# Patient Record
Sex: Male | Born: 1960 | Hispanic: Yes | Marital: Single | Smoking: Never smoker
Health system: Southern US, Community
[De-identification: ages and names within clinical notes are randomized; demographics above are authoritative.]

## PROBLEM LIST (undated history)

## (undated) DIAGNOSIS — I10 Essential (primary) hypertension: Secondary | ICD-10-CM

## (undated) DIAGNOSIS — I509 Heart failure, unspecified: Secondary | ICD-10-CM

## (undated) DIAGNOSIS — R011 Cardiac murmur, unspecified: Secondary | ICD-10-CM

## (undated) DIAGNOSIS — M791 Myalgia, unspecified site: Secondary | ICD-10-CM

## (undated) DIAGNOSIS — R51 Headache: Secondary | ICD-10-CM

## (undated) DIAGNOSIS — I358 Other nonrheumatic aortic valve disorders: Secondary | ICD-10-CM

## (undated) DIAGNOSIS — M549 Dorsalgia, unspecified: Secondary | ICD-10-CM

## (undated) DIAGNOSIS — R519 Headache, unspecified: Secondary | ICD-10-CM

## (undated) HISTORY — DX: Other nonrheumatic aortic valve disorders: I35.8

## (undated) HISTORY — DX: Headache, unspecified: R51.9

## (undated) HISTORY — DX: Cardiac murmur, unspecified: R01.1

## (undated) HISTORY — DX: Headache: R51

## (undated) HISTORY — DX: Essential (primary) hypertension: I10

## (undated) HISTORY — DX: Myalgia, unspecified site: M79.10

## (undated) HISTORY — DX: Dorsalgia, unspecified: M54.9

## (undated) HISTORY — PX: OTHER SURGICAL HISTORY: SHX169

---

## 2014-10-25 ENCOUNTER — Encounter (HOSPITAL_COMMUNITY): Payer: Self-pay | Admitting: Family Medicine

## 2014-10-25 ENCOUNTER — Emergency Department (HOSPITAL_COMMUNITY)
Admission: EM | Admit: 2014-10-25 | Discharge: 2014-10-25 | Disposition: A | Discharge status: Home / Self Care | Payer: Self-pay | Attending: Emergency Medicine | Admitting: Emergency Medicine

## 2014-10-25 DIAGNOSIS — R1013 Epigastric pain: Secondary | ICD-10-CM | POA: Insufficient documentation

## 2014-10-25 LAB — CBC WITH DIFFERENTIAL/PLATELET
BASOS PCT: 0 % (ref 0–1)
Basophils Absolute: 0 10*3/uL (ref 0.0–0.1)
Eosinophils Absolute: 0 10*3/uL (ref 0.0–0.7)
Eosinophils Relative: 0 % (ref 0–5)
HEMATOCRIT: 46.1 % (ref 39.0–52.0)
Hemoglobin: 15.8 g/dL (ref 13.0–17.0)
Lymphocytes Relative: 12 % (ref 12–46)
Lymphs Abs: 1.2 10*3/uL (ref 0.7–4.0)
MCH: 31.3 pg (ref 26.0–34.0)
MCHC: 34.3 g/dL (ref 30.0–36.0)
MCV: 91.5 fL (ref 78.0–100.0)
MONO ABS: 0.3 10*3/uL (ref 0.1–1.0)
MONOS PCT: 3 % (ref 3–12)
Neutro Abs: 8.6 10*3/uL — ABNORMAL HIGH (ref 1.7–7.7)
Neutrophils Relative %: 85 % — ABNORMAL HIGH (ref 43–77)
Platelets: 272 10*3/uL (ref 150–400)
RBC: 5.04 MIL/uL (ref 4.22–5.81)
RDW: 12.4 % (ref 11.5–15.5)
WBC: 10 10*3/uL (ref 4.0–10.5)

## 2014-10-25 LAB — COMPREHENSIVE METABOLIC PANEL
ALBUMIN: 4.3 g/dL (ref 3.5–5.2)
ALT: 31 U/L (ref 0–53)
AST: 30 U/L (ref 0–37)
Alkaline Phosphatase: 92 U/L (ref 39–117)
Anion gap: 16 — ABNORMAL HIGH (ref 5–15)
BILIRUBIN TOTAL: 0.4 mg/dL (ref 0.3–1.2)
BUN: 18 mg/dL (ref 6–23)
CO2: 21 meq/L (ref 19–32)
CREATININE: 0.7 mg/dL (ref 0.50–1.35)
Calcium: 9.3 mg/dL (ref 8.4–10.5)
Chloride: 104 mEq/L (ref 96–112)
Glucose, Bld: 150 mg/dL — ABNORMAL HIGH (ref 70–99)
Potassium: 3.7 mEq/L (ref 3.7–5.3)
Sodium: 141 mEq/L (ref 137–147)
Total Protein: 8.3 g/dL (ref 6.0–8.3)

## 2014-10-25 LAB — LIPASE, BLOOD: LIPASE: 31 U/L (ref 11–59)

## 2014-10-25 MED ORDER — FAMOTIDINE 20 MG PO TABS: 20.0000 mg | ORAL_TABLET | Freq: Two times a day (BID) | ORAL | Status: DC

## 2014-10-25 MED ORDER — SODIUM CHLORIDE 0.9 % IV BOLUS (SEPSIS): 1000.0000 mL | Freq: Once | INTRAVENOUS | Status: DC

## 2014-10-25 MED ORDER — DICYCLOMINE HCL 20 MG PO TABS: 20.0000 mg | ORAL_TABLET | Freq: Two times a day (BID) | ORAL | Status: DC

## 2014-10-25 MED ORDER — GI COCKTAIL ~~LOC~~
30.0000 mL | Freq: Once | ORAL | Status: AC
  Administered 2014-10-25: 30 mL via ORAL
  Filled 2014-10-25: qty 30

## 2014-10-25 MED ORDER — MORPHINE SULFATE 4 MG/ML IJ SOLN: 4.0000 mg | Freq: Once | INTRAMUSCULAR | Status: DC

## 2014-10-25 MED ORDER — ONDANSETRON HCL 4 MG/2ML IJ SOLN: 4.0000 mg | Freq: Once | INTRAMUSCULAR | Status: DC

## 2014-10-25 NOTE — ED Notes (Signed)
Per Arthor CaptainAbigail Harris, PA-- Hold IV and IV meds at present

## 2014-10-25 NOTE — ED Notes (Signed)
Per pt sts abdominal pain and vomiting since last night. sts he drank a few beers last night.

## 2014-10-25 NOTE — Discharge Instructions (Signed)
Gastritis - Adultos  °(Gastritis, Adult) ° La gastrittis es la irritación (inflamación) de la membrana interna del estómago. Puede ser una enfermedad de inicio súbito (aguda) o de largo plazo (crónica). Si la gastritis no se trata, puede causar sangrado y úlceras. °CAUSAS  °La gastritis se produce cuando la membrana que tapiza interiormente al estómago se debilita o se daña. Los jugos digestivos del estómago inflaman el revestimiento del estómago debilitado. El revestimiento del estómago puede debilitarse o dañarse por una infección viral o bacteriana. La infección bacteriana más común es la infección por Helicobacter pylori. También puede ser el resultado del consumo excesivo de alcohol, por el uso de ciertos medicamentos o porque hay demasiado ácido en el estómago.  °SÍNTOMAS  °En algunos casos no hay síntomas. Si se presentan síntomas, éstos pueden ser:  °· Dolor o sensación de ardor en la parte superior del abdomen. °· Náuseas. °· Vómitos. °· Sensación molesta de distensión después de comer. °DIAGNÓSTICO  °El médico puede diagnosticar gastritis según los síntomas y el examen físico. Para determinar la causa de la gastritis, el médico podrá:  °· Pedir análisis de sangre o de materia fecal para diagnosticar la presencia de la bacteria H pylori. °· Gastroscopía. Un tubo delgado y flexible (endoscopio) se pasa por el esófago hasta llegar al estómago. El endoscopio tiene una luz y una cámara en el extremo. El médico utilizará el endoscopio para observar el interior del estómago. °· Tomará una muestra de tejido (biopsia) del estómago para examinarlo en el microscopio. °TRATAMIENTO  °Según la causa de la gastritis podrán recetarle: Antibióticos, si la causa es una infección bacteriana, como una infección por H. pylori. Antiácidos o bloqueadores H2, si hay demasiado ácido en el estómago. El médico le aconsejará que deje de tomar aspirina, ibuprofeno u otros antiinflamatorios no esteroides (AINE).  °INSTRUCCIONES PARA EL  CUIDADO EN EL HOGAR  °· Tome sólo medicamentos de venta libre o recetados, según las indicaciones del médico. °· Si le han recetado antibióticos, tómelos según las indicaciones. Tómelos todos, aunque se sienta mejor. °· Debe ingerir gran cantidad de líquido para mantener la orina de tono claro o color amarillo pálido. °· Evite las comidas y bebidas que empeoran los problemas, como: °¨ Bebidas con cafeína o alcohólicas. °¨ Chocolate. °¨ Sabores a menta. °¨ Ajo y cebolla. °¨ Comidas muy condimentadas. °¨ Cítricos como naranjas, limones o limas. °¨ Alimentos que contengan tomate, como salsas, chile y pizza. °¨ Alimentos fritos y grasos. °· Haga comidas pequeñas durante el día en lugar de 3 comidas abundantes. °SOLICITE ATENCIÓN MÉDICA DE INMEDIATO SI:  °· La materia fecal es negra o de color rojo oscuro. °· Vomita sangre de color rojo brillante o material similar a granos de café. °· No puede retener los líquidos. °· El dolor abdominal empeora. °· Tiene fiebre. °· No mejora luego de 1 semana. °· Tiene preguntas o preocupaciones. °ASEGÚRESE DE QUE:  °· Comprende estas instrucciones. °· Controlará su enfermedad. °· Solicitará ayuda de inmediato si no mejora o si empeora. °Document Released: 08/30/2005 Document Revised: 08/14/2012 °ExitCare® Patient Information ©2015 [employer]. This information is not intended to replace advice given to you by your health care provider. Make sure you discuss any questions you have with your health care provider. ° °

## 2014-10-25 NOTE — ED Provider Notes (Signed)
CSN: [id number]     Arrival date & time 10/25/14  1159 History   First MD Initiated Contact with Patient 10/25/14 1417     Chief Complaint  Patient presents with  . Abdominal Pain     (Consider location/radiation/quality/duration/timing/severity/associated sxs/prior Treatment) HPI  [name] who presents to the emergency department for chief complaint of abdominal pain. Patient states that he was drinking a lot of beers the night before and when he awoke this morning he had severe abdominal pain in the epigastrium. He had one episode of vomiting, nonbilious, nonbloody vomitus. He states his pain was relieved and he has no more pain currently. He denies melena, hematochezia, diarrhea or constipation. He denies fever, chills or focal abdominal pain.   History reviewed. No pertinent past medical history. History reviewed. No pertinent past surgical history. History reviewed. No pertinent family history. History  Substance Use Topics  . Smoking status: Never Smoker   . Smokeless tobacco: Not on file  . Alcohol Use: Yes    Review of Systems  Ten systems reviewed and are negative for acute change, except as noted in the HPI.    Allergies  Review of patient's allergies indicates no known allergies.  Home Medications   Prior to Admission medications   Not on File   BP 124/83 mmHg  Pulse 66  Temp(Src) 97.5 F (36.4 C) (Oral)  Resp 20  SpO2 97% Physical Exam  Constitutional: He is oriented to person, place, and time. He appears well-developed and well-nourished. No distress.  HENT:  Head: Normocephalic and atraumatic.  Eyes: Conjunctivae are normal. No scleral icterus.  Neck: Normal range of motion. Neck supple.  Cardiovascular: Normal rate, regular rhythm and normal heart sounds.   Pulmonary/Chest: Effort normal and breath sounds normal. No respiratory distress.  Abdominal: Soft. Bowel sounds are normal. He exhibits no distension and no mass. There is no  tenderness. There is no rebound and no guarding.  Musculoskeletal: He exhibits no edema.  Neurological: He is alert and oriented to person, place, and time.  Skin: Skin is warm and dry. He is not diaphoretic.  Psychiatric: His behavior is normal.  Nursing note and vitals reviewed.   ED Course  Procedures (including critical care time) Labs Review Labs Reviewed  CBC WITH DIFFERENTIAL - Abnormal; Notable for the following:    Neutrophils Relative % 85 (*)    Neutro Abs 8.6 (*)    All other components within normal limits  COMPREHENSIVE METABOLIC PANEL - Abnormal; Notable for the following:    Glucose, Bld 150 (*)    Anion gap 16 (*)    All other components within normal limits  LIPASE, BLOOD  URINALYSIS, ROUTINE W REFLEX MICROSCOPIC    Imaging Review No results found.   EKG Interpretation None      MDM   Final diagnoses:  Epigastric pain    Patient with severe abdominal pain after drinking heavily last night. Labs show normal lipase. He has no pain at this time. Examination of the abdomen reveals no tenderness, no guarding, no rebound, no distention. He is no elevation in his lipase. I suspect acute gastritis secondary to alcohol ingestion. Patient states that he feels much better, would like to leave. Discussed report her precautions. He appears safe for discharge at this time. Tolerating by mouth fluids.    John Captainbigail Janos Shampine, PA-C 10/30/14 [phone]  John ChickMartha K Linker, MD 11/02/14 [phone]

## 2018-02-26 ENCOUNTER — Other Ambulatory Visit: Payer: Self-pay | Admitting: Nurse Practitioner

## 2018-02-26 ENCOUNTER — Ambulatory Visit
Admission: RE | Admit: 2018-02-26 | Discharge: 2018-02-26 | Disposition: A | Discharge status: Home / Self Care | Payer: Self-pay | Source: Ambulatory Visit | Attending: Nurse Practitioner | Admitting: Nurse Practitioner

## 2018-02-26 DIAGNOSIS — R0602 Shortness of breath: Secondary | ICD-10-CM

## 2018-02-28 ENCOUNTER — Telehealth: Payer: Self-pay

## 2018-02-28 NOTE — Telephone Encounter (Signed)
Referral sent to scheduling. 

## 2018-03-09 ENCOUNTER — Encounter (HOSPITAL_COMMUNITY): Payer: Self-pay | Admitting: Emergency Medicine

## 2018-03-09 ENCOUNTER — Emergency Department (HOSPITAL_COMMUNITY): Payer: Self-pay

## 2018-03-09 ENCOUNTER — Inpatient Hospital Stay (HOSPITAL_COMMUNITY): Payer: Self-pay

## 2018-03-09 ENCOUNTER — Other Ambulatory Visit: Payer: Self-pay

## 2018-03-09 ENCOUNTER — Inpatient Hospital Stay (HOSPITAL_COMMUNITY)
Admission: EM | Admit: 2018-03-09 | Discharge: 2018-03-19 | DRG: 219 | Disposition: A | Discharge status: Home Health | Payer: Self-pay | Attending: Thoracic Surgery (Cardiothoracic Vascular Surgery) | Admitting: Thoracic Surgery (Cardiothoracic Vascular Surgery)

## 2018-03-09 DIAGNOSIS — I358 Other nonrheumatic aortic valve disorders: Secondary | ICD-10-CM

## 2018-03-09 DIAGNOSIS — I5033 Acute on chronic diastolic (congestive) heart failure: Secondary | ICD-10-CM | POA: Diagnosis present

## 2018-03-09 DIAGNOSIS — Z9289 Personal history of other medical treatment: Secondary | ICD-10-CM

## 2018-03-09 DIAGNOSIS — I351 Nonrheumatic aortic (valve) insufficiency: Secondary | ICD-10-CM | POA: Diagnosis present

## 2018-03-09 DIAGNOSIS — D62 Acute posthemorrhagic anemia: Secondary | ICD-10-CM | POA: Diagnosis not present

## 2018-03-09 DIAGNOSIS — J9690 Respiratory failure, unspecified, unspecified whether with hypoxia or hypercapnia: Secondary | ICD-10-CM

## 2018-03-09 DIAGNOSIS — I339 Acute and subacute endocarditis, unspecified: Secondary | ICD-10-CM

## 2018-03-09 DIAGNOSIS — E876 Hypokalemia: Secondary | ICD-10-CM | POA: Diagnosis present

## 2018-03-09 DIAGNOSIS — J811 Chronic pulmonary edema: Secondary | ICD-10-CM

## 2018-03-09 DIAGNOSIS — J9601 Acute respiratory failure with hypoxia: Secondary | ICD-10-CM | POA: Diagnosis present

## 2018-03-09 DIAGNOSIS — I509 Heart failure, unspecified: Secondary | ICD-10-CM

## 2018-03-09 DIAGNOSIS — Z4659 Encounter for fitting and adjustment of other gastrointestinal appliance and device: Secondary | ICD-10-CM

## 2018-03-09 DIAGNOSIS — J810 Acute pulmonary edema: Secondary | ICD-10-CM

## 2018-03-09 DIAGNOSIS — R570 Cardiogenic shock: Secondary | ICD-10-CM | POA: Diagnosis present

## 2018-03-09 DIAGNOSIS — I7410 Embolism and thrombosis of unspecified parts of aorta: Secondary | ICD-10-CM | POA: Diagnosis present

## 2018-03-09 DIAGNOSIS — Z978 Presence of other specified devices: Secondary | ICD-10-CM

## 2018-03-09 DIAGNOSIS — Z952 Presence of prosthetic heart valve: Secondary | ICD-10-CM

## 2018-03-09 DIAGNOSIS — I110 Hypertensive heart disease with heart failure: Secondary | ICD-10-CM | POA: Diagnosis present

## 2018-03-09 DIAGNOSIS — Z79899 Other long term (current) drug therapy: Secondary | ICD-10-CM

## 2018-03-09 DIAGNOSIS — E871 Hypo-osmolality and hyponatremia: Secondary | ICD-10-CM | POA: Diagnosis present

## 2018-03-09 HISTORY — DX: Heart failure, unspecified: I50.9

## 2018-03-09 LAB — POCT I-STAT 3, ART BLOOD GAS (G3+)
Bicarbonate: 24 mmol/L (ref 20.0–28.0)
O2 Saturation: 97 %
PH ART: 7.411 (ref 7.350–7.450)
PO2 ART: 91 mmHg (ref 83.0–108.0)
TCO2: 25 mmol/L (ref 22–32)
pCO2 arterial: 37.7 mmHg (ref 32.0–48.0)

## 2018-03-09 LAB — PHOSPHORUS: PHOSPHORUS: 3.9 mg/dL (ref 2.5–4.6)

## 2018-03-09 LAB — ECHOCARDIOGRAM COMPLETE: Weight: 2640 oz

## 2018-03-09 LAB — LACTIC ACID, PLASMA: Lactic Acid, Venous: 1.1 mmol/L (ref 0.5–1.9)

## 2018-03-09 LAB — CBC
HCT: 32.1 % — ABNORMAL LOW (ref 39.0–52.0)
HEMATOCRIT: 32.5 % — AB (ref 39.0–52.0)
HEMOGLOBIN: 10.5 g/dL — AB (ref 13.0–17.0)
Hemoglobin: 10.9 g/dL — ABNORMAL LOW (ref 13.0–17.0)
MCH: 30.4 pg (ref 26.0–34.0)
MCH: 29.2 pg (ref 26.0–34.0)
MCHC: 34 g/dL (ref 30.0–36.0)
MCHC: 32.3 g/dL (ref 30.0–36.0)
MCV: 89.7 fL (ref 78.0–100.0)
MCV: 90.5 fL (ref 78.0–100.0)
Platelets: 359 10*3/uL (ref 150–400)
Platelets: 334 10*3/uL (ref 150–400)
RBC: 3.58 MIL/uL — ABNORMAL LOW (ref 4.22–5.81)
RBC: 3.59 MIL/uL — ABNORMAL LOW (ref 4.22–5.81)
RDW: 15.3 % (ref 11.5–15.5)
RDW: 15.5 % (ref 11.5–15.5)
WBC: 15.9 10*3/uL — ABNORMAL HIGH (ref 4.0–10.5)
WBC: 16.7 10*3/uL — AB (ref 4.0–10.5)

## 2018-03-09 LAB — HEPATIC FUNCTION PANEL
ALT: 95 U/L — ABNORMAL HIGH (ref 17–63)
ALT: 94 U/L — ABNORMAL HIGH (ref 17–63)
AST: 74 U/L — ABNORMAL HIGH (ref 15–41)
AST: 84 U/L — AB (ref 15–41)
Albumin: 1.9 g/dL — ABNORMAL LOW (ref 3.5–5.0)
Albumin: 1.8 g/dL — ABNORMAL LOW (ref 3.5–5.0)
Alkaline Phosphatase: 151 U/L — ABNORMAL HIGH (ref 38–126)
Alkaline Phosphatase: 147 U/L — ABNORMAL HIGH (ref 38–126)
BILIRUBIN DIRECT: 0.8 mg/dL — AB (ref 0.1–0.5)
BILIRUBIN INDIRECT: 0.9 mg/dL (ref 0.3–0.9)
Bilirubin, Direct: 0.6 mg/dL — ABNORMAL HIGH (ref 0.1–0.5)
Indirect Bilirubin: 1.2 mg/dL — ABNORMAL HIGH (ref 0.3–0.9)
TOTAL PROTEIN: 7.2 g/dL (ref 6.5–8.1)
Total Bilirubin: 1.8 mg/dL — ABNORMAL HIGH (ref 0.3–1.2)
Total Bilirubin: 1.7 mg/dL — ABNORMAL HIGH (ref 0.3–1.2)
Total Protein: 7.8 g/dL (ref 6.5–8.1)

## 2018-03-09 LAB — BASIC METABOLIC PANEL
ANION GAP: 12 (ref 5–15)
Anion gap: 13 (ref 5–15)
BUN: 12 mg/dL (ref 6–20)
BUN: 11 mg/dL (ref 6–20)
CALCIUM: 7.2 mg/dL — AB (ref 8.9–10.3)
CHLORIDE: 96 mmol/L — AB (ref 101–111)
CO2: 20 mmol/L — ABNORMAL LOW (ref 22–32)
CO2: 22 mmol/L (ref 22–32)
Calcium: 7.7 mg/dL — ABNORMAL LOW (ref 8.9–10.3)
Chloride: 95 mmol/L — ABNORMAL LOW (ref 101–111)
Creatinine, Ser: 0.81 mg/dL (ref 0.61–1.24)
Creatinine, Ser: 0.71 mg/dL (ref 0.61–1.24)
GFR calc Af Amer: >60 mL/min (ref >60)
GFR calc non Af Amer: >60 mL/min (ref >60)
GFR calc non Af Amer: >60 mL/min (ref >60)
Glucose, Bld: 125 mg/dL — ABNORMAL HIGH (ref 65–99)
Glucose, Bld: 107 mg/dL — ABNORMAL HIGH (ref 65–99)
Potassium: 3.3 mmol/L — ABNORMAL LOW (ref 3.5–5.1)
Potassium: 3.2 mmol/L — ABNORMAL LOW (ref 3.5–5.1)
Sodium: 128 mmol/L — ABNORMAL LOW (ref 135–145)
Sodium: 130 mmol/L — ABNORMAL LOW (ref 135–145)

## 2018-03-09 LAB — URINALYSIS, ROUTINE W REFLEX MICROSCOPIC
Bacteria, UA: NONE SEEN
Bilirubin Urine: NEGATIVE
GLUCOSE, UA: NEGATIVE mg/dL
Ketones, ur: 5 mg/dL — AB
LEUKOCYTES UA: NEGATIVE
Nitrite: NEGATIVE
Protein, ur: NEGATIVE mg/dL
SPECIFIC GRAVITY, URINE: 1.008 (ref 1.005–1.030)
pH: 7 (ref 5.0–8.0)

## 2018-03-09 LAB — RAPID URINE DRUG SCREEN, HOSP PERFORMED
AMPHETAMINES: NOT DETECTED
BARBITURATES: NOT DETECTED
Benzodiazepines: NOT DETECTED
Cocaine: NOT DETECTED
Opiates: NOT DETECTED
TETRAHYDROCANNABINOL: NOT DETECTED

## 2018-03-09 LAB — RESPIRATORY PANEL BY PCR
Adenovirus: NOT DETECTED
BORDETELLA PERTUSSIS-RVPCR: NOT DETECTED
CORONAVIRUS NL63-RVPPCR: NOT DETECTED
Chlamydophila pneumoniae: NOT DETECTED
Coronavirus 229E: NOT DETECTED
Coronavirus HKU1: NOT DETECTED
Coronavirus OC43: NOT DETECTED
Influenza A: NOT DETECTED
Influenza B: NOT DETECTED
METAPNEUMOVIRUS-RVPPCR: NOT DETECTED
Mycoplasma pneumoniae: NOT DETECTED
PARAINFLUENZA VIRUS 1-RVPPCR: NOT DETECTED
PARAINFLUENZA VIRUS 4-RVPPCR: NOT DETECTED
Parainfluenza Virus 2: NOT DETECTED
Parainfluenza Virus 3: NOT DETECTED
RESPIRATORY SYNCYTIAL VIRUS-RVPPCR: NOT DETECTED
Rhinovirus / Enterovirus: NOT DETECTED

## 2018-03-09 LAB — MRSA PCR SCREENING: MRSA by PCR: NEGATIVE

## 2018-03-09 LAB — GLUCOSE, CAPILLARY: GLUCOSE-CAPILLARY: 114 mg/dL — AB (ref 65–99)

## 2018-03-09 LAB — ABO/RH: ABO/RH(D): B POS

## 2018-03-09 LAB — BRAIN NATRIURETIC PEPTIDE: B Natriuretic Peptide: 721.6 pg/mL — ABNORMAL HIGH (ref 0.0–100.0)

## 2018-03-09 LAB — I-STAT TROPONIN, ED: Troponin i, poc: 0.02 ng/mL (ref 0.00–0.08)

## 2018-03-09 LAB — PROCALCITONIN: PROCALCITONIN: 0.29 ng/mL

## 2018-03-09 LAB — MAGNESIUM: Magnesium: 2.1 mg/dL (ref 1.7–2.4)

## 2018-03-09 LAB — HEMOGLOBIN A1C
HEMOGLOBIN A1C: 5.8 % — AB (ref 4.8–5.6)
Mean Plasma Glucose: 119.76 mg/dL

## 2018-03-09 MED ORDER — POTASSIUM CHLORIDE 10 MEQ/100ML IV SOLN
10.0000 meq | INTRAVENOUS | Status: DC
  Administered 2018-03-09: 10 meq via INTRAVENOUS
  Filled 2018-03-09: qty 100

## 2018-03-09 MED ORDER — SODIUM CHLORIDE 0.9 % IV SOLN
1.0000 g | Freq: Once | INTRAVENOUS | Status: AC
  Administered 2018-03-09: 1 g via INTRAVENOUS
  Filled 2018-03-09: qty 10

## 2018-03-09 MED ORDER — POTASSIUM CHLORIDE 10 MEQ/100ML IV SOLN
10.0000 meq | INTRAVENOUS | Status: AC
  Administered 2018-03-09: 10 meq via INTRAVENOUS
  Filled 2018-03-09: qty 100

## 2018-03-09 MED ORDER — FUROSEMIDE 10 MG/ML IJ SOLN
40.0000 mg | Freq: Once | INTRAMUSCULAR | Status: AC
  Administered 2018-03-09: 40 mg via INTRAVENOUS

## 2018-03-09 MED ORDER — SODIUM CHLORIDE 0.9 % IV SOLN
500.0000 mg | Freq: Once | INTRAVENOUS | Status: AC
  Administered 2018-03-09: 500 mg via INTRAVENOUS
  Filled 2018-03-09: qty 500

## 2018-03-09 MED ORDER — SODIUM CHLORIDE 0.9 % IV SOLN
250.0000 mL | INTRAVENOUS | Status: DC | PRN
  Administered 2018-03-09: 21:00:00 via INTRAVENOUS
  Administered 2018-03-10: 250 mL via INTRAVENOUS

## 2018-03-09 MED ORDER — POTASSIUM CHLORIDE CRYS ER 20 MEQ PO TBCR: 40.0000 meq | EXTENDED_RELEASE_TABLET | ORAL | Status: DC

## 2018-03-09 MED ORDER — CEFTRIAXONE SODIUM 1 G IJ SOLR
1.0000 g | Freq: Once | INTRAMUSCULAR | Status: AC
  Administered 2018-03-09: 1 g via INTRAVENOUS
  Filled 2018-03-09: qty 10

## 2018-03-09 MED ORDER — FENTANYL CITRATE (PF) 100 MCG/2ML IJ SOLN
50.0000 ug | Freq: Once | INTRAMUSCULAR | Status: AC
  Administered 2018-03-09: 50 ug via INTRAVENOUS

## 2018-03-09 MED ORDER — NITROGLYCERIN IN D5W 200-5 MCG/ML-% IV SOLN: 0.0000 ug/min | INTRAVENOUS | Status: DC

## 2018-03-09 MED ORDER — INSULIN ASPART 100 UNIT/ML ~~LOC~~ SOLN
0.0000 [IU] | SUBCUTANEOUS | Status: DC
  Administered 2018-03-10: 2 [IU] via SUBCUTANEOUS
  Administered 2018-03-10: 3 [IU] via SUBCUTANEOUS

## 2018-03-09 MED ORDER — PROPOFOL 1000 MG/100ML IV EMUL
INTRAVENOUS | Status: AC
  Filled 2018-03-09: qty 100

## 2018-03-09 MED ORDER — SODIUM CHLORIDE 0.9 % IV SOLN
2.0000 g | INTRAVENOUS | Status: DC
  Administered 2018-03-10 – 2018-03-18 (×8): 2 g via INTRAVENOUS
  Filled 2018-03-09 (×11): qty 20

## 2018-03-09 MED ORDER — MAGNESIUM SULFATE 2 GM/50ML IV SOLN: 2.0000 g | Freq: Once | INTRAVENOUS | Status: DC

## 2018-03-09 MED ORDER — POTASSIUM & SODIUM PHOSPHATES 280-160-250 MG PO PACK: 1.0000 | PACK | Freq: Three times a day (TID) | ORAL | Status: DC

## 2018-03-09 MED ORDER — MIDAZOLAM HCL 2 MG/2ML IJ SOLN: 2.0000 mg | INTRAMUSCULAR | Status: DC | PRN

## 2018-03-09 MED ORDER — POTASSIUM CHLORIDE CRYS ER 20 MEQ PO TBCR: 20.0000 meq | EXTENDED_RELEASE_TABLET | ORAL | Status: DC

## 2018-03-09 MED ORDER — FENTANYL BOLUS VIA INFUSION
50.0000 ug | INTRAVENOUS | Status: DC | PRN
  Administered 2018-03-09 – 2018-03-10 (×2): 50 ug via INTRAVENOUS
  Filled 2018-03-09: qty 50

## 2018-03-09 MED ORDER — FUROSEMIDE 10 MG/ML IJ SOLN: 40.0000 mg | Freq: Three times a day (TID) | INTRAMUSCULAR | Status: DC

## 2018-03-09 MED ORDER — NOREPINEPHRINE BITARTRATE 1 MG/ML IV SOLN
0.0000 ug/min | INTRAVENOUS | Status: AC
  Administered 2018-03-09: 0 ug/min via INTRAVENOUS
  Filled 2018-03-09: qty 4

## 2018-03-09 MED ORDER — MIDAZOLAM HCL 2 MG/2ML IJ SOLN
2.0000 mg | INTRAMUSCULAR | Status: DC | PRN
  Administered 2018-03-09 (×2): 2 mg via INTRAVENOUS
  Filled 2018-03-09 (×2): qty 2

## 2018-03-09 MED ORDER — FENTANYL 2500MCG IN NS 250ML (10MCG/ML) PREMIX INFUSION
25.0000 ug/h | INTRAVENOUS | Status: DC
  Administered 2018-03-09: 50 ug/h via INTRAVENOUS
  Administered 2018-03-10: 175 ug/h via INTRAVENOUS
  Filled 2018-03-09 (×3): qty 250

## 2018-03-09 MED ORDER — VANCOMYCIN HCL 10 G IV SOLR
1500.0000 mg | Freq: Once | INTRAVENOUS | Status: AC
  Administered 2018-03-09: 1500 mg via INTRAVENOUS
  Filled 2018-03-09: qty 1500

## 2018-03-09 MED ORDER — FAMOTIDINE 40 MG/5ML PO SUSR
20.0000 mg | Freq: Two times a day (BID) | ORAL | Status: DC
  Administered 2018-03-10 (×3): 20 mg
  Filled 2018-03-09 (×3): qty 2.5

## 2018-03-09 MED ORDER — CHLORHEXIDINE GLUCONATE 0.12 % MT SOLN
15.0000 mL | Freq: Two times a day (BID) | OROMUCOSAL | Status: DC
  Administered 2018-03-10 (×3): 15 mL via OROMUCOSAL

## 2018-03-09 MED ORDER — LIDOCAINE HCL (PF) 1 % IJ SOLN
INTRAMUSCULAR | Status: AC
  Filled 2018-03-09: qty 5

## 2018-03-09 MED ORDER — POTASSIUM CHLORIDE CRYS ER 20 MEQ PO TBCR: 30.0000 meq | EXTENDED_RELEASE_TABLET | ORAL | Status: DC

## 2018-03-09 MED ORDER — FUROSEMIDE 10 MG/ML IJ SOLN
15.0000 mg/h | INTRAVENOUS | Status: DC
  Administered 2018-03-09: 8 mg/h via INTRAVENOUS
  Administered 2018-03-10: 15 mg/h via INTRAVENOUS
  Filled 2018-03-09: qty 25
  Filled 2018-03-09: qty 20
  Filled 2018-03-09: qty 25

## 2018-03-09 MED ORDER — FUROSEMIDE 10 MG/ML IJ SOLN
40.0000 mg | Freq: Once | INTRAMUSCULAR | Status: AC
  Administered 2018-03-09: 40 mg via INTRAVENOUS
  Filled 2018-03-09: qty 4

## 2018-03-09 MED ORDER — NITROPRUSSIDE SODIUM 25 MG/ML IV SOLN
0.0000 ug/kg/min | INTRAVENOUS | Status: DC
  Administered 2018-03-09: 0.2 ug/kg/min via INTRAVENOUS
  Filled 2018-03-09: qty 2

## 2018-03-09 MED ORDER — INSULIN ASPART 100 UNIT/ML ~~LOC~~ SOLN: 0.0000 [IU] | Freq: Three times a day (TID) | SUBCUTANEOUS | Status: DC

## 2018-03-09 MED ORDER — ENOXAPARIN SODIUM 40 MG/0.4ML ~~LOC~~ SOLN
40.0000 mg | SUBCUTANEOUS | Status: DC
  Administered 2018-03-10: 40 mg via SUBCUTANEOUS
  Filled 2018-03-09: qty 0.4

## 2018-03-09 MED ORDER — SODIUM CHLORIDE 0.9 % IV SOLN: 6.0000 g | Freq: Once | INTRAVENOUS | Status: DC

## 2018-03-09 MED ORDER — VANCOMYCIN HCL IN DEXTROSE 750-5 MG/150ML-% IV SOLN
750.0000 mg | Freq: Three times a day (TID) | INTRAVENOUS | Status: DC
Start: 2018-03-10 — End: 2018-03-15
  Administered 2018-03-10 – 2018-03-15 (×16): 750 mg via INTRAVENOUS
  Filled 2018-03-09 (×17): qty 150

## 2018-03-09 MED ORDER — ORAL CARE MOUTH RINSE: 15.0000 mL | Freq: Two times a day (BID) | OROMUCOSAL | Status: DC

## 2018-03-09 MED ORDER — DOCUSATE SODIUM 50 MG/5ML PO LIQD
100.0000 mg | Freq: Two times a day (BID) | ORAL | Status: DC
  Administered 2018-03-10 (×3): 100 mg
  Filled 2018-03-09 (×3): qty 10

## 2018-03-09 NOTE — Progress Notes (Signed)
  Echocardiogram 2D Echocardiogram has been performed.  Aydn Ferrara T Daisha Filosa 03/09/2018, 4:57 PM

## 2018-03-09 NOTE — Progress Notes (Signed)
Pt's girlfriend Kenney Housemananya and Alfredo Bachstepson Henry at bedside. NP Leitha BleakKatalina and chaplain notified so HPOA paperwork can get started.

## 2018-03-09 NOTE — ED Notes (Signed)
NRB applied to patient. Pulse ox dropped to 80% on 4 LPM via nasal cannula. Patient using accessory muscles to breath.

## 2018-03-09 NOTE — Progress Notes (Signed)
Pt transported on Bipap from ED to 2H12.

## 2018-03-09 NOTE — Procedures (Signed)
Endotracheal Intubation Procedure Note Indication for endotracheal intubation: impending respiratory failure Sedation: etomidate and midazolam Paralytic: succinylcholine Equipment: Macintosh 3 laryngoscope blade and 7.95mm cuffed endotracheal tube; Secured at 23cm at the lip Cricoid Pressure: no Number of attempts: 1 ETT location confirmed by by auscultation, by CXR and ETCO2 monitor.

## 2018-03-09 NOTE — Consult Note (Signed)
Reason for Consult:aortic valve endocarditis Referring Physician: Dr. Debbe Bales John Byrd is an 57 y.o. male.  HPI: 57 yo man with history of hypertension. No prior cardiac history. He has been feeling poorly for about 3 weeks now. He has had progressive cough, shortness of breath and leg swelling. No recent fever but did have chills about a month ago. He was seen about a week ago and had a CXR but there are no records in our system about the visit.  Today he presented to the ED with worsening shortness of breath and orthopnea. CXR showed severe pulmonary edema. BNP elevated. WBC elevated. Was started on empiric antibiotics. A TTE showed severe AI with a vegetation.  He was given IV lasix and had a good initial response. Currently looks better than he did on arrival per Dr. Dellie Catholic. He is on BIPAP  History and discussion were done through a professional interpreter. Past Medical History:  Diagnosis Date  . CHF (congestive heart failure) ([place])     Past Surgical History:  Procedure Laterality Date  . No prior surgery      History reviewed. No pertinent family history.  Social History:  reports that he has never smoked. He has never used smokeless tobacco. He reports that he drinks alcohol. He reports that he has current or past drug history.  Allergies: No Known Allergies  Medications:  Scheduled: . enoxaparin (LOVENOX) injection  40 mg Subcutaneous Q24H  . furosemide  40 mg Intravenous Q8H  . [START ON 03/10/2018] insulin aspart  0-15 Units Subcutaneous TID WC    Results for orders placed or performed during the hospital encounter of 03/09/18 (from the past 48 hour(s))  Hepatic function panel     Status: Abnormal   Collection Time: 03/09/18 11:08 AM  Result Value Ref Range   Total Protein 7.8 6.5 - 8.1 g/dL   Albumin 1.9 (L) 3.5 - 5.0 g/dL   AST 74 (H) 15 - 41 U/L   ALT 95 (H) 17 - 63 U/L   Alkaline Phosphatase 151 (H) 38 - 126 U/L   Total Bilirubin 1.8 (H)  0.3 - 1.2 mg/dL   Bilirubin, Direct 0.6 (H) 0.1 - 0.5 mg/dL   Indirect Bilirubin 1.2 (H) 0.3 - 0.9 mg/dL    Comment: Performed at [facility], [address], [place], [place] [address]  Basic metabolic panel     Status: Abnormal   Collection Time: 03/09/18 11:20 AM  Result Value Ref Range   Sodium 128 (L) 135 - 145 mmol/L   Potassium 3.3 (L) 3.5 - 5.1 mmol/L   Chloride 95 (L) 101 - 111 mmol/L   CO2 20 (L) 22 - 32 mmol/L   Glucose, Bld 125 (H) 65 - 99 mg/dL   BUN 12 6 - 20 mg/dL   Creatinine, Ser 0.81 0.61 - 1.24 mg/dL   Calcium 7.7 (L) 8.9 - 10.3 mg/dL   GFR calc non Af Amer >60 >60 mL/min   GFR calc Af Amer >60 >60 mL/min    Comment: (NOTE) The eGFR has been calculated using the CKD EPI equation. This calculation has not been validated in all clinical situations. eGFR's persistently <60 mL/min signify possible Chronic Kidney Disease.    Anion gap 13 5 - 15    Comment: Performed at [facility] [address], [place], [place] [address]  CBC     Status: Abnormal   Collection Time: 03/09/18 11:20 AM  Result Value Ref Range  WBC 15.9 (H) 4.0 - 10.5 K/uL   RBC 3.58 (L) 4.22 - 5.81 MIL/uL   Hemoglobin 10.9 (L) 13.0 - 17.0 g/dL   HCT 32.1 (L) 39.0 - 52.0 %   MCV 89.7 78.0 - 100.0 fL   MCH 30.4 26.0 - 34.0 pg   MCHC 34.0 30.0 - 36.0 g/dL   RDW 15.3 11.5 - 15.5 %   Platelets 359 150 - 400 K/uL    Comment: Performed at [facility] [address] [place], [place] [address]  I-stat troponin, ED     Status: None   Collection Time: 03/09/18 11:26 AM  Result Value Ref Range   Troponin i, poc 0.02 0.00 - 0.08 ng/mL   Comment 3            Comment: Due to the release kinetics of cTnI, a negative result within the first hours of the onset of symptoms does not rule out myocardial infarction with certainty. If myocardial infarction is still suspected, repeat the test at appropriate intervals.   Brain natriuretic peptide     Status: Abnormal   Collection Time:  03/09/18 11:58 AM  Result Value Ref Range   B Natriuretic Peptide 721.6 (H) 0.0 - 100.0 pg/mL    Comment: Performed at [facility] [address] [place], [place] [address]  Type and screen [facility]     Status: None   Collection Time: 03/09/18  4:10 PM  Result Value Ref Range   ABO/RH(D) B POS    Antibody Screen NEG    Sample Expiration      03/12/2018 Performed at [facility], [place] [address], [place], [place] [address]   ABO/Rh     Status: None (Preliminary result)   Collection Time: 03/09/18  4:10 PM  Result Value Ref Range   ABO/RH(D)      B POS Performed at [facility] [address] [place], [place] [address]     Dg Chest 2 View  Result Date: 03/09/2018 CLINICAL DATA:  SOB Pt is having SOB and CP x 2 days. Pt was on O2 at time of exam. EXAM: CHEST - 2 VIEW COMPARISON:  02/26/2018 FINDINGS: There are interstitial and airspace opacities throughout the entire right lung and within the left lung centered in the perihilar and lower lung, partly silhouetting the left heart border. The distribution of airspace opacities is less centrally predominant than it was on the prior study. No pleural effusion or pneumothorax. Heart is mildly enlarged. No convincing mediastinal or hilar masses. Hila are partly obscured by the contiguous lung opacities. Skeletal structures are intact. IMPRESSION: 1. Bilateral interstitial and airspace lung opacities, similar to the prior chest radiograph although less centrally predominant. Multifocal pneumonia suspected. Electronically Signed   By: Lajean Manes M.D.   On: 03/09/2018 11:57    Review of Systems  Constitutional: Positive for malaise/fatigue.  Eyes: Negative for blurred vision and double vision.  Respiratory: Positive for shortness of breath.   Cardiovascular: Positive for leg swelling. Negative for chest pain.  Gastrointestinal: Negative for nausea and vomiting.  Musculoskeletal: Negative for joint pain  and myalgias.  Neurological: Negative for loss of consciousness and headaches.   Blood pressure 124/60, pulse (!) 105, temperature 98.4 F (36.9 C), temperature source Oral, resp. rate (!) 49, height 5' 3" (1.6 m), weight 167 lb 15.9 oz (76.2 kg), SpO2 100 %. Physical Exam  Vitals reviewed. Constitutional: He is oriented to person, place, and  time. He appears well-developed.  Increased WOB, using accessory muscles  HENT:  Head: Normocephalic and atraumatic.  Mouth/Throat: No oropharyngeal exudate.  Eyes: Conjunctivae and EOM are normal. No scleral icterus.  Neck: Neck supple. No thyromegaly present.  Cardiovascular: Normal rate, regular rhythm and intact distal pulses.  Murmur (almost continuous along left sternal border) heard. Respiratory: He is in respiratory distress. He has rales (diffuse bilateral).  GI: He exhibits distension (mildly). There is no tenderness.  Musculoskeletal: He exhibits edema (2+).  Lymphadenopathy:    He has no cervical adenopathy.  Neurological: He is alert and oriented to person, place, and time. No cranial nerve deficit. He exhibits normal muscle tone.  Skin: Skin is warm and dry. He is not diaphoretic.   Echocardiogram Study Conclusions  - Left ventricle: The cavity size was mildly dilated. Wall   thickness was normal. Systolic function was normal. The estimated   ejection fraction was in the range of 50% to 55%. Wall motion was   normal; there were no regional wall motion abnormalities. - Aortic valve: There was an apparent, large vegetation. There was   severe regurgitation. - Mitral valve: Calcified annulus. There was mild regurgitation.  Impressions:  - Normal LV systolic function; mild LVE; large vegetation on aortic   valve with severe AI (pressure half time 135; flow reversal in   descending aorta); mild MR and TR. I personally reviewed the echo images along with Dr. Stanford Breed and concur with his findings  Assessment/Plan: Mr.  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 He has been ill for the past several weeks and had a CXR about a week that showed pulmonary edema. He is currently in acute respiratory failure due to pulmonary edema. His CXr shows severe pulmonary edema and he likely will need to be intubated.  He will need AVR. Timing is difficult in his case as we want to avoid undue delay but also want to try to optimize his condition prior to the procedure. Based on his CXR I don't believe he would wean from CPB if we went to OR now. If we can diurese him to some degree prior to surgery it will improve his chances of survival.  I discussed aortic valve replacement with John Byrd via the interpreter. I informed of the general nature of the procedure, the incisions to be used, the need for cardiopulmonary bypass. He understands the high risk nature of the procedure. He understands the indications, risks, benefits and alternatives. He understands the risks include but are not limited to death, MI, stroke, blood clots, infection, bleeding, possible need for transfusion, respiratory or renal failure, as wel as the possibility of other unforeseeable complications.  Also discussed the type of valve- mechanical v tissue. We discussed the advantages/ disadvantages of each. He recently ran out of his medication for BP. I think a tissue valve is the best option for him. He agrees.  Will likely proceed with surgery in next 24-48 hours.  Melrose Nakayama 03/09/2018, 6:00 PM

## 2018-03-09 NOTE — H&P (Signed)
Date: 03/09/2018               Patient Name:  John Byrd MRN: [id number]  DOB: 1961-05-08 Age / Sex: 57 y.o., male   PCP: Patient, No Pcp Per         Medical Service: Internal Medicine Teaching Service         Attending Physician: Dr. Kloefkorn, [place], MD    First Contact: Dr. Alinda Money Pager: [phone]  Second Contact: Dr. Obie Dredge Pager: [phone]       After Hours (After 5p/  First Contact Pager: [phone]  weekends / holidays): Second Contact Pager: [phone]   Chief Complaint: Shortness of breath  History of Present Illness: John Byrd in a 57 yo M without known significant past medical history who present with progressive shortness of breath. Patient is primarily spanish speaking and history was obtained with interpretation. Patient has been experiencing 3 weeks of progressive shortness of breath. He also also had worsening lower extremity edema for the past 3 days and states he has never had this before. He denies specific medical problems, but states he takes 4 medicines. He knows one is to help him urinate and another is to help him breath. EMR does show history of 20mg  daily lasix use. He endorses a dry cough, increasing DOE, orthopnea, and recent viral illness (several weeks ago). He denies fever, chills, chest pain, or abdominal complaints.  In the ED, Patient was hypoxic on arrival, requiring BiPAP, tachypnic, intemittently tachycardic, and normotensive. Labs showed CBC with WBC 15.9, Hgb 10.9; BMP showed Na 128, K 3.3, Bicarb 20; Troponin 0.02; BNP 721. EKG showed sinus tachycardia, Q-waves and T-wave inversion in 1 and aVL. CXR showed diffuse bilateral interstitial and airspace opacities. Patient was given Ceftriaxone, Azithromycin, 40mg  IV Lasix, and IV KClx2. Patient to be admitted for further work up and care, Critical care consulted and patient to be transferred to ICU.  Meds:  Current Meds  Medication Sig  . furosemide (LASIX) 20 MG tablet Take 20 mg by  mouth daily.  . [DISCONTINUED] amoxicillin (AMOXIL) 500 MG capsule Take 500 mg by mouth 3 (three) times daily.   Allergies: Allergies as of 03/09/2018  . (No Known Allergies)   Past Medical History:  Diagnosis Date  . CHF (congestive heart failure) ([facility])     Family History: History reviewed. No pertinent family history. - Reviewed on admission, patient denies significant family medical history  Social History:  Social History   Tobacco Use  . Smoking status: Never Smoker  . Smokeless tobacco: Never Used  Substance Use Topics  . Alcohol use: Yes  . Drug use: Not Currently  - Reviewed on admission  Review of Systems: A complete ROS was negative except as per HPI.  Physical Exam: Blood pressure 117/62, pulse (!) 102, temperature 98.4 F (36.9 C), temperature source Oral, resp. rate (!) 34, height 5\' 3"  (1.6 m), weight 167 lb 15.9 oz (76.2 kg), SpO2 97 %. Physical Exam  Constitutional: He appears well-developed and well-nourished. No distress.  Cardiovascular: Regular rhythm and intact distal pulses.  Tachycardic Murmur  Pulmonary/Chest:  Increased work of breathing Respiratory distress Difusely Rhonchi Rales right lung  Abdominal: Soft. Bowel sounds are normal. He exhibits no distension. There is no tenderness.  Musculoskeletal: He exhibits no tenderness or deformity.  3+ Pitting Edema Bilaterally  Neurological: He is alert.  Skin: Skin is warm and dry.   EKG: personally reviewed my interpretation is sinus tachycardia, Q-waves and T-wave  inversion in 1 and aVL.  CXR: personally reviewed my interpretation is diffuse bilateral interstitial and airspace opacities.  Assessment & Plan by Problem:  Acute Hypoxic respiratory failue: In the setting of volume overload. Patient with several weeks of worsening shortness of breath and several days of worsening edema. Noted to have questionable infiltrates on CXR and leukocytosis. Given antibiotic in ED for possible pneumonia,  though patient remains afebrile and endorses only dry cough. Patient on BiPAP in ED with significantly increased work of breathing, looking tired.  - Suspect acute heart failure vs Acute/Chronic heart failure (hx unclear as patient denies heart failure, yet is reportedly on lasix) - PCCM Consulted are admitting patient to ICU - STAT echocardiogram and additional 40mg  IV Lasix ordered, rest per PCCM.  Dispo: Admit patient to Inpatient with expected length of stay greater than 2 midnights.  Signed: Beola CordMelvin, Alexander, MD 03/09/2018, 7:03 PM  Pager: [phone]

## 2018-03-09 NOTE — Consult Note (Addendum)
Cardiology Consultation:   Patient ID: John Byrd; [id number]; 1961/09/18   Admit date: 03/09/2018 Date of Consult: 03/09/2018  Primary Care Provider: Patient, No Pcp Per Primary Cardiologist: No primary care provider on file. NEW Primary Electrophysiologist: NA    Patient Profile:   John Byrd is a 57 y.o. male with a no known PMH who is being seen today for the evaluation of abnormal echo with pt in acute respiratory failure at the request of Dr. Dellie Catholic.  History of Present Illness:   John Byrd has no known PMH and does not see physicians freq presented to ER today with progressive dyspnea over 3 weeks, lower ext edema and non productive cough.  CXR 1 week ago with bilateral pulmonary airspace disease and central predominance, possible diffuse pulmonary edema or infection.  No follow up.  He did note couple of months ago chills, but no fevers, night sweats weight loss.  No chest pain.  Has normally been healthy.  Works in [employer] and from [place].  No IV drug use, no tobacco or ETOH.     He was placed on BiPAP and IV lasix given along with ceftriaxone and azithromycin, his sats with in the 80s on RA.   At some point in past he was on lasix   On Echo vegetation appears on aortic valve, Dr. Stanford Breed at bedside.  Pt's resp 50 with BIPap  EKG:  The EKG was personally reviewed and demonstrates:  ST at 109 lat T wave abnormality  Telemetry:  Telemetry was personally reviewed and demonstrates:  ST  BNP 721 Troponin poc 0.02 Na 128, K+ 3.3, Cr 0.81  Alk phos 151 Alb 1.9 AST 74 ALT 95 D. Bili, 0.6, indirect Bili 1.2, total bili 1.8  WBC 15.9, Hgb 10.9  Past Medical History:  Diagnosis Date  . CHF (congestive heart failure) ([place])     Past Surgical History:  Procedure Laterality Date  . No prior surgery       Home Medications:  Prior to Admission medications   Medication Sig Start Date End Date Taking? Authorizing Provider  furosemide  (LASIX) 20 MG tablet Take 20 mg by mouth daily.   Yes [provider]  dicyclomine (BENTYL) 20 MG tablet Take 1 tablet (20 mg total) by mouth 2 (two) times daily. Patient not taking: Reported on 03/09/2018 10/25/14   Margarita Mail, PA-C  famotidine (PEPCID) 20 MG tablet Take 1 tablet (20 mg total) by mouth 2 (two) times daily. Patient not taking: Reported on 03/09/2018 10/25/14   Margarita Mail, PA-C    Inpatient Medications: Scheduled Meds:  Continuous Infusions: . vancomycin    . [START ON 03/10/2018] vancomycin     PRN Meds:   Allergies:   No Known Allergies  Social History:   Social History   Socioeconomic History  . Marital status: Single    Spouse name: Not on file  . Number of children: Not on file  . Years of education: Not on file  . Highest education level: Not on file  Occupational History  . Not on file  Social Needs  . Financial resource strain: Not on file  . Food insecurity:    Worry: Not on file    Inability: Not on file  . Transportation needs:    Medical: Not on file    Non-medical: Not on file  Tobacco Use  . Smoking status: Never Smoker  . Smokeless tobacco: Never Used  Substance and Sexual Activity  . Alcohol use: Yes  .  Drug use: Not Currently  . Sexual activity: Not on file  Lifestyle  . Physical activity:    Days per week: Not on file    Minutes per session: Not on file  . Stress: Not on file  Relationships  . Social connections:    Talks on phone: Not on file    Gets together: Not on file    Attends religious service: Not on file    Active member of club or organization: Not on file    Attends meetings of clubs or organizations: Not on file    Relationship status: Not on file  . Intimate partner violence:    Fear of current or ex partner: Not on file    Emotionally abused: Not on file    Physically abused: Not on file    Forced sexual activity: Not on file  Other Topics Concern  . Not on file  Social History Narrative  .  Not on file    Family History:   History reviewed. No pertinent family history. pt is too short of breath to answer questions on family hx   ROS:  Please see the history of present illness.  General:no colds or fevers, chills 2 months ago, no weight changes Skin:no rashes or ulcers HEENT:no blurred vision, no congestion CV:see HPI PUL:see HPI GI:no diarrhea constipation or melena, no indigestion GU:no hematuria, no dysuria MS:no joint pain, no claudication Neuro:no syncope, no lightheadedness Endo:no diabetes, no thyroid disease  All other ROS reviewed and negative.     Physical Exam/Data:   Vitals:   03/09/18 1645 03/09/18 1700 03/09/18 1722 03/09/18 1724  BP: (!) 86/51 124/60    Pulse: (!) 102 (!) 104 (!) 105   Resp: (!) 24 (!) 27 (!) 49   Temp:      TempSrc:      SpO2: 98% 100% 100%   Weight:    167 lb 15.9 oz (76.2 kg)  Height:    '5\' 3"'  (1.6 m)    Intake/Output Summary (Last 24 hours) at 03/09/2018 1735 Last data filed at 03/09/2018 1724 Gross per 24 hour  Intake 200 ml  Output 1000 ml  Net -800 ml   Filed Weights   03/09/18 1110 03/09/18 1724  Weight: 165 lb (74.8 kg) 167 lb 15.9 oz (76.2 kg)   Body mass index is 29.76 kg/m.  General:  Well nourished, well developed, thin male in acute respiratory distress bipap in place, after lasix he is mildly improved HEENT: normal Lymph: no adenopathy Neck: no JVD Endocrine:  No thryomegaly Vascular: No carotid bruits; FA pulses 2+ bilaterally without bruits  Cardiac:  normal S1, S2; RRR; no murmur, gallup rub or click  Lungs: breath sounds to auscultation bilaterally, no wheezing, rhonchi ++ rales  Abd: soft, nontender, no hepatomegaly  Ext: ++ edema Musculoskeletal:  No deformities, BUE and BLE strength normal and equal Skin: warm and dry  Neuro:  Alert and oriented X 3 MAE, follows commands, no focal abnormalities noted Psych:  Normal affect    Relevant CV Studies: Echo p  Laboratory  Data:  Chemistry Recent Labs  Lab 03/09/18 1120  NA 128*  K 3.3*  CL 95*  CO2 20*  GLUCOSE 125*  BUN 12  CREATININE 0.81  CALCIUM 7.7*  GFRNONAA >60  GFRAA >60  ANIONGAP 13    Recent Labs  Lab 03/09/18 1108  PROT 7.8  ALBUMIN 1.9*  AST 74*  ALT 95*  ALKPHOS 151*  BILITOT 1.8*   Hematology  Recent Labs  Lab 03/09/18 1120  WBC 15.9*  RBC 3.58*  HGB 10.9*  HCT 32.1*  MCV 89.7  MCH 30.4  MCHC 34.0  RDW 15.3  PLT 359   Cardiac EnzymesNo results for input(s): TROPONINI in the last 168 hours.  Recent Labs  Lab 03/09/18 1126  TROPIPOC 0.02    BNP Recent Labs  Lab 03/09/18 1158  BNP 721.6*    DDimer No results for input(s): DDIMER in the last 168 hours.  Radiology/Studies:  Dg Chest 2 View  Result Date: 03/09/2018 CLINICAL DATA:  SOB Pt is having SOB and CP x 2 days. Pt was on O2 at time of exam. EXAM: CHEST - 2 VIEW COMPARISON:  02/26/2018 FINDINGS: There are interstitial and airspace opacities throughout the entire right lung and within the left lung centered in the perihilar and lower lung, partly silhouetting the left heart border. The distribution of airspace opacities is less centrally predominant than it was on the prior study. No pleural effusion or pneumothorax. Heart is mildly enlarged. No convincing mediastinal or hilar masses. Hila are partly obscured by the contiguous lung opacities. Skeletal structures are intact. IMPRESSION: 1. Bilateral interstitial and airspace lung opacities, similar to the prior chest radiograph although less centrally predominant. Multifocal pneumonia suspected. Electronically Signed   By: Lajean Manes M.D.   On: 03/09/2018 11:57    Assessment and Plan:   1. Acute respiratory failure with edema and possible inf. With elevated WBC.  Per CCM on BiPap but resp 45-50 on abx. 2. Vegetation on aortic valve tentative read of Echo 3. Elevated LFTs may be due to volume overload.   Dr. Stanford Breed has seen   For questions or updates,  please contact [place] Please consult [email or web address] for contact info under Cardiology/STEMI.   Signed, Kirk Ruths, MD  03/09/2018 5:35 PM As above, patient seen and examined, briefly he is a 57 year old male with no prior cardiac history for evaluation of endocarditis and acute AI.  Patient is from [place].  He denies any IV drug abuse or recent teeth cleanings.  Approximately 4 weeks ago he did have some chills but denies fevers.  Over the past 3 weeks he has had progressive increased dyspnea on exertion, orthopnea and pedal edema.  There is no chest pain.  He presented to the emergency room and cardiology asked to evaluate.  Physical exam shows diffuse crackles.  He has a 3/6 diastolic murmur left sternal border.  He has mild abdominal distention and 2+ bilateral lower extremity edema.  Chest x-ray shows diffuse edema.  Sodium is 128 with potassium 3.3.  Alkaline phosphatase 151 with albumin 1.9.  SGOT 74, SGPT 95.  BNP 721.  White blood cell count 15.9.  Electrocardiogram shows sinus tachycardia with no conduction abnormalities.  Lateral T wave inversion. 1 subacute bacterial endocarditis-I have personally reviewed the patient's echocardiogram.  His LV function is normal and there is mild left ventricular enlargement.  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  He is in heart failure requiring BiPAP.  His systolic blood pressure is 110.  We will draw blood cultures x3.  We will then begin vancomycin and Rocephin.  We will diurese as tolerated and add low-dose nipride for afterload reduction as blood pressure tolerates.  I have consulted cardiothoracic surgery as patient will require aortic valve replacement.  Patient is critically ill.  May require intubation. 2 elevated liver functions-likely passive congestion.  Diurese as tolerated. 3 hyponatremia-follow sodium with diuresis. Kirk Ruths,  MD

## 2018-03-09 NOTE — Progress Notes (Addendum)
Pharmacy Antibiotic Note  John Byrd is a 57 y.o. male admitted on 03/09/2018 with SOB and concern for PNA, possibly endocarditis. Pharmacy has been consulted for Vancomycin and Rocephin dosing.  The patient has received Azithromycin 500 mg x 1 and Rocephin 1g x 1 dose in the ED   Plan: 1. Vancomycin 1500 mg IV x 1 to load followed by 750 mg IV every 8 hours 2. Rocephin 1g IV x 1 now to make a total of 2g dose for today 3. Start Rocephin 2g IV every 24 hours on 4/7 4. Will continue to follow renal function, culture results, LOT, and antibiotic de-escalation plans   Weight: 165 lb (74.8 kg)  Temp (24hrs), Avg:98.4 F (36.9 C), Min:98.4 F (36.9 C), Max:98.4 F (36.9 C)  Recent Labs  Lab 03/09/18 1120  WBC 15.9*  CREATININE 0.81    CrCl cannot be calculated (Unknown ideal weight.).    No Known Allergies  Antimicrobials this admission: CTX 4/6 >> Azithro 4/6 x 1 Vanc 4/6 >>  Dose adjustments this admission:   Microbiology results:   Thank you for allowing pharmacy to be a part of this patient's care.  John Byrd, John Byrd 03/09/2018 5:12 PM

## 2018-03-09 NOTE — Progress Notes (Signed)
Pt intubated by Dr. Jimmey Ralph. From RSI kit 24m etomidate and 1048msuccinylcholine used as well as 20m84mersed. Lung sounds auscultated bilaterally. Pt tolerated well. CXR confirmed placement.

## 2018-03-09 NOTE — ED Notes (Signed)
MD to bedside. Pt transported to XR.

## 2018-03-09 NOTE — CV Procedure (Signed)
Attempted 2D Echo , other staff were in room with patient, will try again at a later time.  John Byrd

## 2018-03-09 NOTE — ED Provider Notes (Signed)
[facility] EMERGENCY DEPARTMENT Provider Note   CSN: [id number] Arrival date & time: 03/09/18  1053     History   Chief Complaint Chief Complaint  Patient presents with  . Shortness of Breath  . Fatigue  . Chest Pain    HPI John Byrd is a 57 y.o. male.  HPI   Patient is primarily Spanish-speaking.  Interpreter was used for history.  Supplemented by family.  He is a 57 year old male.  Progressive dyspnea over the past 3-4 weeks. Progressive leg edema. Cough and sometimes spitting up mucus. No CP. No fever.  No significant past medical history, but he does not receive routine medical care.  He does drink alcohol. Unable to exactly quantify. Never smoker.   History reviewed. No pertinent past medical history.  There are no active problems to display for this patient.   History reviewed. No pertinent surgical history.      Home Medications    Prior to Admission medications   Medication Sig Start Date End Date Taking? Authorizing Provider  amoxicillin (AMOXIL) 500 MG capsule Take 500 mg by mouth 3 (three) times daily.   Yes [provider]  furosemide (LASIX) 20 MG tablet Take 20 mg by mouth daily.   Yes [provider]  dicyclomine (BENTYL) 20 MG tablet Take 1 tablet (20 mg total) by mouth 2 (two) times daily. Patient not taking: Reported on 03/09/2018 10/25/14   Arthor Captain, PA-C  famotidine (PEPCID) 20 MG tablet Take 1 tablet (20 mg total) by mouth 2 (two) times daily. Patient not taking: Reported on 03/09/2018 10/25/14   Arthor Captain, PA-C    Family History No family history on file.  Social History Social History   Tobacco Use  . Smoking status: Never Smoker  . Smokeless tobacco: Never Used  Substance Use Topics  . Alcohol use: Yes  . Drug use: Not Currently     Allergies   Patient has no known allergies.   Review of Systems Review of Systems  All systems reviewed and negative, other than as  noted in HPI.  Physical Exam Updated Vital Signs BP (!) 115/56   Pulse (!) 102   Temp 98.4 F (36.9 C) (Oral)   Resp (!) 37   Wt 74.8 kg (165 lb)   SpO2 95%   Physical Exam  Constitutional: He appears well-developed and well-nourished. No distress.  HENT:  Head: Normocephalic and atraumatic.  Eyes: Conjunctivae are normal. Right eye exhibits no discharge. Left eye exhibits no discharge.  Neck: Neck supple.  Cardiovascular: Normal rate, regular rhythm and normal heart sounds. Exam reveals no gallop and no friction rub.  No murmur heard. Pulmonary/Chest: He is in respiratory distress.  Significant tachypnea.  Diffuse rales bilaterally.  Accessory muscle usage.  Abdominal: Soft. He exhibits no distension. There is no tenderness.  Musculoskeletal: He exhibits edema. He exhibits no tenderness.  Symmetric pitting LE edema  Neurological: He is alert.  Skin: Skin is warm and dry.  Psychiatric: He has a normal mood and affect. His behavior is normal. Thought content normal.  Nursing note and vitals reviewed.    ED Treatments / Results  Labs (all labs ordered are listed, but only abnormal results are displayed) Labs Reviewed  BASIC METABOLIC PANEL - Abnormal; Notable for the following components:      Result Value   Sodium 128 (*)    Potassium 3.3 (*)    Chloride 95 (*)    CO2 20 (*)  Glucose, Bld 125 (*)    Calcium 7.7 (*)    All other components within normal limits  CBC - Abnormal; Notable for the following components:   WBC 15.9 (*)    RBC 3.58 (*)    Hemoglobin 10.9 (*)    HCT 32.1 (*)    All other components within normal limits  BRAIN NATRIURETIC PEPTIDE - Abnormal; Notable for the following components:   B Natriuretic Peptide 721.6 (*)    All other components within normal limits  HEPATIC FUNCTION PANEL  I-STAT TROPONIN, ED    EKG EKG Interpretation  Date/Time:  Saturday March 09 2018 11:01:48 EDT Ventricular Rate:  109 PR Interval:  158 QRS  Duration: 92 QT Interval:  374 QTC Calculation: 503 R Axis:   2 Text Interpretation:  Sinus tachycardia T wave abnormality, consider lateral ischemia Abnormal ECG No old tracing to compare Confirmed by Raeford Razor [phone]) on 03/09/2018 11:55:41 AM   Radiology Dg Chest 2 View  Result Date: 03/09/2018 CLINICAL DATA:  SOB Pt is having SOB and CP x 2 days. Pt was on O2 at time of exam. EXAM: CHEST - 2 VIEW COMPARISON:  02/26/2018 FINDINGS: There are interstitial and airspace opacities throughout the entire right lung and within the left lung centered in the perihilar and lower lung, partly silhouetting the left heart border. The distribution of airspace opacities is less centrally predominant than it was on the prior study. No pleural effusion or pneumothorax. Heart is mildly enlarged. No convincing mediastinal or hilar masses. Hila are partly obscured by the contiguous lung opacities. Skeletal structures are intact. IMPRESSION: 1. Bilateral interstitial and airspace lung opacities, similar to the prior chest radiograph although less centrally predominant. Multifocal pneumonia suspected. Electronically Signed   By: Amie Portland M.D.   On: 03/09/2018 11:57    Procedures Procedures (including critical care time)  CRITICAL CARE Performed by: Raeford Razor Total critical care time: 45 minutes Critical care time was exclusive of separately billable procedures and treating other patients. Critical care was necessary to treat or prevent imminent or life-threatening deterioration. Critical care was time spent personally by me on the following activities: development of treatment plan with patient and/or surrogate as well as nursing, discussions with consultants, evaluation of patient's response to treatment, examination of patient, obtaining history from patient or surrogate, ordering and performing treatments and interventions, ordering and review of laboratory studies, ordering and review of radiographic  studies, pulse oximetry and re-evaluation of patient's condition.   Medications Ordered in ED Medications - No data to display   Initial Impression / Assessment and Plan / ED Course  I have reviewed the triage vital signs and the nursing notes.  Pertinent labs & imaging results that were available during my care of the patient were reviewed by me and considered in my medical decision making (see chart for details).    57 year old male with respiratory failure.  Hypoxic on arrival and even then when placed on NRB.  He was placed on BiPAP.  Chest x-ray with diffuse interstitial and airspace opacities.  Given his degree of hypoxemia and leukocytosis though antibiotics were ordered to cover for possible community-acquired pneumonia which may be superimposed. Clinically this is more likely heart failure than infectious.    He is clearly volume overloaded.  JVD.  Severe symmetric pitting lower extremity edema.  BNP elevated. Denies any pain.  No reported fever.  Afebrile in the emergency room.  He has had a cough but this can be seen with  heart failure.  His oxygenation improved significantly when placed on BiPAP.  He subjectively says that he feels better since being on it. Lasix naive and BP on softer side. 40mg  ordered. Supplement potassium.   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 Unsure of how much, but I get the impression that likely a lot (family just laughed when I asked how much he drinks). 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 Needs admitted for ongoing management/evaluation.    Final Clinical Impressions(s) / ED Diagnoses   Final diagnoses:      Respiratory failure [facility](HCC)    Pulmonary edema      ED Discharge Orders    None       Raeford RazorKohut, Makynna Manocchio, MD 03/12/18 [phone]

## 2018-03-09 NOTE — H&P (Signed)
PULMONARY / CRITICAL CARE MEDICINE   Name: John Byrd MRN: [id number] DOB: 08/07/1961    ADMISSION DATE:  03/09/2018 CONSULTATION DATE:  4/6/419  REFERRING MD:  Enedina FinnerStepheen Kohut  CHIEF COMPLAINT:  dyspnea  HISTORY OF PRESENT ILLNESS:   57 year old male no known past medical history who presents with 3 weeks of progressive dyspnea, lower extremity edema and nonproductive cough.  He had a chest x-ray performed 1 week ago for this complaint which showed multifocal airspace opacities however he has not followed up for additional care for this.  He denies fevers, night sweats, weight loss, chest pain, nausea, vomiting, diarrhea, myalgias, sick contacts.  No sore throat.  Did report that he was having occasional chills a couple of months ago that improved with taking over-the-counter medicine.  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  He is from [place] though his girlfriend lives here and is on her way through present at the time of my interview.  He personally denied current or prior tobacco, alcohol or drug abuse however his wife told 1 of the other emergency room providers that he drinks alcohol heavily.  In the emergency department he was noted to be acutely tachypneic, oxygen saturation in the 80s on room air which did not improve with a nonrebreather and so he was started on BiPAP and given a dose of Lasix, ceftriaxone and azithromycin.    PAST MEDICAL HISTORY :  He  has no past medical history on file.  PAST SURGICAL HISTORY: He  has no past surgical history on file.  No Known Allergies  No current facility-administered medications on file prior to encounter.    Current Outpatient Medications on File Prior to Encounter  Medication Sig  . furosemide (LASIX) 20 MG tablet Take 20 mg by mouth daily.  Marland Kitchen. dicyclomine (BENTYL) 20 MG tablet Take 1 tablet (20 mg total) by mouth 2 (two) times daily. (Patient  not taking: Reported on 03/09/2018)  . famotidine (PEPCID) 20 MG tablet Take 1 tablet (20 mg total) by mouth 2 (two) times daily. (Patient not taking: Reported on 03/09/2018)    FAMILY HISTORY:  His has no family status information on file.    SOCIAL HISTORY: As above  REVIEW OF SYSTEMS:   Full Review of Systems negative except otherwise specified as above   VITAL SIGNS: BP (!) 133/46   Pulse (!) 112   Temp 98.4 F (36.9 C) (Oral)   Resp (!) 27   Wt 165 lb (74.8 kg)   SpO2 95%   HEMODYNAMICS:    VENTILATOR SETTINGS:    INTAKE / OUTPUT: No intake/output data recorded.  PHYSICAL EXAMINATION: Physical Exam  Constitutional: He is oriented to person, place, and time. He appears distressed.  Acutely ill-appearing and severe respiratory distress, able to speak and move in stretcher for exam though with conversational dyspnea through the BiPAP.  HENT:  Head: Normocephalic and atraumatic.  Eyes: EOM are normal. Right eye exhibits no discharge. Left eye exhibits no discharge. No scleral icterus.  Cardiovascular:  No murmur heard. Tachycardic though regular.  Very difficult to auscultate heart sounds while in high BiPAP settings however sounds as though he may have a systolic ejection murmur  Pulmonary/Chest:  Tachypneic with respiratory rate in the high 50s, accessory muscle use present, on BiPAP  Abdominal: Soft. He exhibits distension. There is no tenderness.  Musculoskeletal: He exhibits edema (Lower extremity to the knees). He exhibits no deformity.  Neurological:  He is alert and oriented to person, place, and time. No cranial nerve deficit.  Skin: Skin is warm. He is diaphoretic.  Psychiatric: Affect normal.    LABS:  BMET Recent Labs  Lab 03/09/18 1120  NA 128*  K 3.3*  CL 95*  CO2 20*  BUN 12  CREATININE 0.81  GLUCOSE 125*    Electrolytes Recent Labs  Lab 03/09/18 1120  CALCIUM 7.7*    CBC Recent Labs  Lab 03/09/18 1120  WBC 15.9*  HGB 10.9*   HCT 32.1*  PLT 359    Coag's No results for input(s): APTT, INR in the last 168 hours.  Sepsis Markers No results for input(s): LATICACIDVEN, PROCALCITON, O2SATVEN in the last 168 hours.  ABG No results for input(s): PHART, PCO2ART, PO2ART in the last 168 hours.  Liver Enzymes No results for input(s): AST, ALT, ALKPHOS, BILITOT, ALBUMIN in the last 168 hours.  Cardiac Enzymes No results for input(s): TROPONINI, PROBNP in the last 168 hours.  Glucose No results for input(s): GLUCAP in the last 168 hours.  Imaging Dg Chest 2 View  Result Date: 03/09/2018 CLINICAL DATA:  SOB Pt is having SOB and CP x 2 days. Pt was on O2 at time of exam. EXAM: CHEST - 2 VIEW COMPARISON:  02/26/2018 FINDINGS: There are interstitial and airspace opacities throughout the entire right lung and within the left lung centered in the perihilar and lower lung, partly silhouetting the left heart border. The distribution of airspace opacities is less centrally predominant than it was on the prior study. No pleural effusion or pneumothorax. Heart is mildly enlarged. No convincing mediastinal or hilar masses. Hila are partly obscured by the contiguous lung opacities. Skeletal structures are intact. IMPRESSION: 1. Bilateral interstitial and airspace lung opacities, similar to the prior chest radiograph although less centrally predominant. Multifocal pneumonia suspected. Electronically Signed   By: Amie Portland M.D.   On: 03/09/2018 11:57     STUDIES:  CXR with multifocal airspace opacities  CULTURES: none  ANTIBIOTICS: Ceftriaxone, azithromycin  SIGNIFICANT EVENTS: 03/09/18: admitted to MICU with severe respiratory distress  LINES/TUBES: none  DISCUSSION: Family not available  ASSESSMENT / PLAN: 57 year old male no known past medical history who presents with 3 weeks of progressive dyspnea, lower extremity edema and nonproductive cough, found to be in acute hypoxemic respiratory failure with chest  x-ray appearance of severe pulmonary edema concerning for new onset decompensated heart failure of unclear cause.  PULMONARY A: Hypoxemic respiratory failure  P:   - BPAP 10/5 and 100%.  His respiratory exam is very concerning and I explained to him that if he does not rapidly improve with BiPAP and diuresis then he will need to be intubated to which he consents  CARDIOVASCULAR A:  Suspected new onset decompensated heart failure  P:  -Lasix 40 mg IV every 8 hours, nitroglycerin drip titrate for systolic blood pressure 100-110, BiPAP -Follow-up on transthoracic echo results as he may have a severe valvular lesion given his exam or a dilated cardiomyopathy.  Other potential etiologies include poorly controlled hypertension given he has not seen a physician in many years though at his age this seems less likely  RENAL A:   No active issues   P:   - monitor electrolytes q12h with aggressive diuresis  GASTROINTESTINAL A:   No active issues  P:   - check liver function tests given suspected heart failure. Can consider right upper quadrant [place] as well given possibly alcohol abuse and volume overload but  his presentation is much more consistent with HF as a cause  HEMATOLOGIC A:   No active issues    INFECTIOUS A:   Hypoxemic respiratory failure  P:   -Received a single dose of ceftriaxone and Zithromax emergency department, however he denies productive cough, fevers, chills.  Has a mild leukocytosis with a left shift however this is likely stress reaction due to decompensated heart failure and we will hold off on antibiotics for now -Check respiratory viral panel as a cause for his acute decompensation  ENDOCRINE A:   No active issues    P:   - check hemoglobin A1C, sliding scale insulin  NEUROLOGIC A:   No active issues    FAMILY  - Updates: will speak with his wife/girlfriend to get further background  - Inter-disciplinary family meet or Palliative Care meeting due  by:  day 7   [place] Skylen Spiering, MD Pulmonary and Critical Care Medicine [facility] Pager: [phone]  03/09/2018, 3:48 PM

## 2018-03-09 NOTE — ED Notes (Signed)
Admitting physicians at bedside at this time.  

## 2018-03-09 NOTE — Progress Notes (Signed)
Admitted to IC  Respiratory  Status, dyspnea on bipap, RR 40-40's Aline placed by MD.  Using translator  To speak to patient regarding procedures.  Started on antibiotics after BC done x 3, and Nitroprusside.  To keep BP lower 90-100 SBP

## 2018-03-09 NOTE — H&P (View-Only) (Signed)
Reason for Consult:aortic valve endocarditis Referring Physician: Dr. Crenshaw  John Byrd is an 57 y.o. male.  HPI: 57 yo man with history of hypertension. No prior cardiac history. He has been feeling poorly for about 3 weeks now. He has had progressive cough, shortness of breath and leg swelling. No recent fever but did have chills about a month ago. He was seen about a week ago and had a CXR but there are no records in our system about the visit.  Today he presented to the ED with worsening shortness of breath and orthopnea. CXR showed severe pulmonary edema. BNP elevated. WBC elevated. Was started on empiric antibiotics. A TTE showed severe AI with a vegetation.  He was given IV lasix and had a good initial response. Currently looks better than he did on arrival per Dr. Kloefkorn. He is on BIPAP  History and discussion were done through a professional interpreter. Past Medical History:  Diagnosis Date  . CHF (congestive heart failure) (HCC)     Past Surgical History:  Procedure Laterality Date  . No prior surgery      History reviewed. No pertinent family history.  Social History:  reports that he has never smoked. He has never used smokeless tobacco. He reports that he drinks alcohol. He reports that he has current or past drug history.  Allergies: No Known Allergies  Medications:  Scheduled: . enoxaparin (LOVENOX) injection  40 mg Subcutaneous Q24H  . furosemide  40 mg Intravenous Q8H  . [START ON 03/10/2018] insulin aspart  0-15 Units Subcutaneous TID WC    Results for orders placed or performed during the hospital encounter of 03/09/18 (from the past 48 hour(s))  Hepatic function panel     Status: Abnormal   Collection Time: 03/09/18 11:08 AM  Result Value Ref Range   Total Protein 7.8 6.5 - 8.1 g/dL   Albumin 1.9 (L) 3.5 - 5.0 g/dL   AST 74 (H) 15 - 41 U/L   ALT 95 (H) 17 - 63 U/L   Alkaline Phosphatase 151 (H) 38 - 126 U/L   Total Bilirubin 1.8 (H)  0.3 - 1.2 mg/dL   Bilirubin, Direct 0.6 (H) 0.1 - 0.5 mg/dL   Indirect Bilirubin 1.2 (H) 0.3 - 0.9 mg/dL    Comment: Performed at [facility], [address] [place], [place] [address]  Basic metabolic panel     Status: Abnormal   Collection Time: 03/09/18 11:20 AM  Result Value Ref Range   Sodium 128 (L) 135 - 145 mmol/L   Potassium 3.3 (L) 3.5 - 5.1 mmol/L   Chloride 95 (L) 101 - 111 mmol/L   CO2 20 (L) 22 - 32 mmol/L   Glucose, Bld 125 (H) 65 - 99 mg/dL   BUN 12 6 - 20 mg/dL   Creatinine, Ser 0.81 0.61 - 1.24 mg/dL   Calcium 7.7 (L) 8.9 - 10.3 mg/dL   GFR calc non Af Amer >60 >60 mL/min   GFR calc Af Amer >60 >60 mL/min    Comment: (NOTE) The eGFR has been calculated using the CKD EPI equation. This calculation has not been validated in all clinical situations. eGFR's persistently <60 mL/min signify possible Chronic Kidney Disease.    Anion gap 13 5 - 15    Comment: Performed at [facility], [address] [place], [place] [address]  CBC     Status: Abnormal   Collection Time: 03/09/18 11:20 AM  Result Value Ref Range     WBC 15.9 (H) 4.0 - 10.5 K/uL   RBC 3.58 (L) 4.22 - 5.81 MIL/uL   Hemoglobin 10.9 (L) 13.0 - 17.0 g/dL   HCT 32.1 (L) 39.0 - 52.0 %   MCV 89.7 78.0 - 100.0 fL   MCH 30.4 26.0 - 34.0 pg   MCHC 34.0 30.0 - 36.0 g/dL   RDW 15.3 11.5 - 15.5 %   Platelets 359 150 - 400 K/uL    Comment: Performed at [facility], [address] [place], [place] [address]  I-stat troponin, ED     Status: None   Collection Time: 03/09/18 11:26 AM  Result Value Ref Range   Troponin i, poc 0.02 0.00 - 0.08 ng/mL   Comment 3            Comment: Due to the release kinetics of cTnI, a negative result within the first hours of the onset of symptoms does not rule out myocardial infarction with certainty. If myocardial infarction is still suspected, repeat the test at appropriate intervals.   Brain natriuretic peptide     Status: Abnormal   Collection Time:  03/09/18 11:58 AM  Result Value Ref Range   B Natriuretic Peptide 721.6 (H) 0.0 - 100.0 pg/mL    Comment: Performed at [facility], [address] [place], [place] [address]  Type and screen [facility]     Status: None   Collection Time: 03/09/18  4:10 PM  Result Value Ref Range   ABO/RH(D) B POS    Antibody Screen NEG    Sample Expiration      03/12/2018 Performed at [facility], [address] [place], [place] [address]   ABO/Rh     Status: None (Preliminary result)   Collection Time: 03/09/18  4:10 PM  Result Value Ref Range   ABO/RH(D)      B POS Performed at  Hospital Lab, [address] [place], [place] [address]     Dg Chest 2 View  Result Date: 03/09/2018 CLINICAL DATA:  SOB Pt is having SOB and CP x 2 days. Pt was on O2 at time of exam. EXAM: CHEST - 2 VIEW COMPARISON:  02/26/2018 FINDINGS: There are interstitial and airspace opacities throughout the entire right lung and within the left lung centered in the perihilar and lower lung, partly silhouetting the left heart border. The distribution of airspace opacities is less centrally predominant than it was on the prior study. No pleural effusion or pneumothorax. Heart is mildly enlarged. No convincing mediastinal or hilar masses. Hila are partly obscured by the contiguous lung opacities. Skeletal structures are intact. IMPRESSION: 1. Bilateral interstitial and airspace lung opacities, similar to the prior chest radiograph although less centrally predominant. Multifocal pneumonia suspected. Electronically Signed   By: David  Ormond M.D.   On: 03/09/2018 11:57    Review of Systems  Constitutional: Positive for malaise/fatigue.  Eyes: Negative for blurred vision and double vision.  Respiratory: Positive for shortness of breath.   Cardiovascular: Positive for leg swelling. Negative for chest pain.  Gastrointestinal: Negative for nausea and vomiting.  Musculoskeletal: Negative for joint pain  and myalgias.  Neurological: Negative for loss of consciousness and headaches.   Blood pressure 124/60, pulse (!) 105, temperature 98.4 F (36.9 C), temperature source Oral, resp. rate (!) 49, height 5' 3" (1.6 m), weight 167 lb 15.9 oz (76.2 kg), SpO2 100 %. Physical Exam  Vitals reviewed. Constitutional: He is oriented to person, place, and   time. He appears well-developed.  Increased WOB, using accessory muscles  HENT:  Head: Normocephalic and atraumatic.  Mouth/Throat: No oropharyngeal exudate.  Eyes: Conjunctivae and EOM are normal. No scleral icterus.  Neck: Neck supple. No thyromegaly present.  Cardiovascular: Normal rate, regular rhythm and intact distal pulses.  Murmur (almost continuous along left sternal border) heard. Respiratory: He is in respiratory distress. He has rales (diffuse bilateral).  GI: He exhibits distension (mildly). There is no tenderness.  Musculoskeletal: He exhibits edema (2+).  Lymphadenopathy:    He has no cervical adenopathy.  Neurological: He is alert and oriented to person, place, and time. No cranial nerve deficit. He exhibits normal muscle tone.  Skin: Skin is warm and dry. He is not diaphoretic.   Echocardiogram Study Conclusions  - Left ventricle: The cavity size was mildly dilated. Wall   thickness was normal. Systolic function was normal. The estimated   ejection fraction was in the range of 50% to 55%. Wall motion was   normal; there were no regional wall motion abnormalities. - Aortic valve: There was an apparent, large vegetation. There was   severe regurgitation. - Mitral valve: Calcified annulus. There was mild regurgitation.  Impressions:  - Normal LV systolic function; mild LVE; large vegetation on aortic   valve with severe AI (pressure half time 135; flow reversal in   descending aorta); mild MR and TR. I personally reviewed the echo images along with Dr. Stanford Breed and concur with his findings  Assessment/Plan: Mr.  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 He has been ill for the past several weeks and had a CXR about a week that showed pulmonary edema. He is currently in acute respiratory failure due to pulmonary edema. His CXr shows severe pulmonary edema and he likely will need to be intubated.  He will need AVR. Timing is difficult in his case as we want to avoid undue delay but also want to try to optimize his condition prior to the procedure. Based on his CXR I don't believe he would wean from CPB if we went to OR now. If we can diurese him to some degree prior to surgery it will improve his chances of survival.  I discussed aortic valve replacement with Mr. Nichols via the interpreter. I informed of the general nature of the procedure, the incisions to be used, the need for cardiopulmonary bypass. He understands the high risk nature of the procedure. He understands the indications, risks, benefits and alternatives. He understands the risks include but are not limited to death, MI, stroke, blood clots, infection, bleeding, possible need for transfusion, respiratory or renal failure, as wel as the possibility of other unforeseeable complications.  Also discussed the type of valve- mechanical v tissue. We discussed the advantages/ disadvantages of each. He recently ran out of his medication for BP. I think a tissue valve is the best option for him. He agrees.  Will likely proceed with surgery in next 24-48 hours.  Melrose Nakayama 03/09/2018, 6:00 PM

## 2018-03-09 NOTE — Progress Notes (Signed)
Patient's girlfriend Archie Pattenonya showed up at the hospital with her son. Chaplain present. Using interpreter, explained to her that patient wants to make her his POA. She is in agreement. POA paperwork filled out in presence of chaplain. Discussed with patient and Archie Pattenonya that we plan on immediate intubation followed by central line placement given his respiratory distress which has progressed even in the past few hours. They are both in agreement.

## 2018-03-09 NOTE — Progress Notes (Signed)
Called to patient's bedside to evaluate him. On chart review he is a 56yoM with no PMHx who presented with 3wks of SOB, Cough, and LE edema. He was found to have severe aortic insufficiency in the setting of a vegetation. He has acute hypoxic respiratory failure requiring BIPAP. Over the course of the evening he has had increasing WOB. At time of my exam RR 30's on 100% FIO2 IPAP 10, EPAP 5. Increased IPAP to 15 and EPAP to 8. On Lasix gtt diuresing well (I/O net negative 2L today). Hypokalemic repleting now. Rhonchi b/l with moderate accessory muscle use. 3+ BLE edema. Regarding his endocarditis, ID has been consulted and recommends Vanc and Ceftriaxone, which patient is getting.   Using the intepreter IPAD, I discussed patient's current condition, prognosis, and plan of care with the patient. He understands that he needs a valve replacement but that the surgery may be risky given his severe hypoxia due to pulmonary edema. He understands he will likely need intubation tonight, and he agrees to this. In the case of cardiac arrest he says he would want CPR. He says he does not currently have a POA. He is not married. He has 3 adult children (all in their 6320's) in [place] but no family here in the [place]. He has a girlfriend named Archie Pattenonya that he wants to make his POA. However, we cannot currently reach her on any of the numbers listed in his chart. And he does not remember her number. We have paged [facility] to assist us in setting up this POA. Ideally we can designate a POA prior to intubating patient. His condition remains precarious.    60 minutes nonprocedural critical care time  Milana ObeyKathleen Dacen Frayre, MD Pulmonary & Critical Care Medicine Pager: [phone]

## 2018-03-09 NOTE — ED Notes (Signed)
Put pt on 2L O2 via [place]

## 2018-03-09 NOTE — ED Notes (Signed)
Echo at bedside

## 2018-03-09 NOTE — ED Notes (Signed)
Patient returned to room. 

## 2018-03-09 NOTE — Procedures (Signed)
Arterial Catheter Insertion Procedure Note John Byrd [id number] November 22, 1961  Procedure: Insertion of Arterial Catheter  Indications: Blood pressure monitoring  Procedure Details Consent: Risks of procedure as well as the alternatives and risks of each were explained to the (patient/caregiver).  Consent for procedure obtained. Time Out: Verified patient identification, verified procedure, site/side was marked, verified correct patient position, special equipment/implants available, medications/allergies/relevent history reviewed, required imaging and test results available.  Performed  Maximum sterile technique was used including antiseptics. Skin prep: Chlorhexidine; local anesthetic administered 22 gauge catheter was inserted into right radial artery using the Seldinger technique. ULTRASOUND GUIDANCE USED: NO Evaluation Blood flow good; BP tracing good. Complications: No apparent complications.   John Byrd 03/09/2018

## 2018-03-10 ENCOUNTER — Inpatient Hospital Stay (HOSPITAL_COMMUNITY): Payer: Self-pay

## 2018-03-10 ENCOUNTER — Encounter (HOSPITAL_COMMUNITY): Payer: Self-pay

## 2018-03-10 DIAGNOSIS — R011 Cardiac murmur, unspecified: Secondary | ICD-10-CM

## 2018-03-10 DIAGNOSIS — I351 Nonrheumatic aortic (valve) insufficiency: Secondary | ICD-10-CM

## 2018-03-10 DIAGNOSIS — B9689 Other specified bacterial agents as the cause of diseases classified elsewhere: Secondary | ICD-10-CM

## 2018-03-10 DIAGNOSIS — R570 Cardiogenic shock: Secondary | ICD-10-CM

## 2018-03-10 DIAGNOSIS — Z9911 Dependence on respirator [ventilator] status: Secondary | ICD-10-CM

## 2018-03-10 DIAGNOSIS — I330 Acute and subacute infective endocarditis: Secondary | ICD-10-CM

## 2018-03-10 DIAGNOSIS — R0989 Other specified symptoms and signs involving the circulatory and respiratory systems: Secondary | ICD-10-CM

## 2018-03-10 LAB — CBC
HEMATOCRIT: 30.9 % — AB (ref 39.0–52.0)
HEMOGLOBIN: 9.8 g/dL — AB (ref 13.0–17.0)
MCH: 28.9 pg (ref 26.0–34.0)
MCHC: 31.7 g/dL (ref 30.0–36.0)
MCV: 91.2 fL (ref 78.0–100.0)
Platelets: 358 10*3/uL (ref 150–400)
RBC: 3.39 MIL/uL — ABNORMAL LOW (ref 4.22–5.81)
RDW: 15.5 % (ref 11.5–15.5)
WBC: 22.1 10*3/uL — ABNORMAL HIGH (ref 4.0–10.5)

## 2018-03-10 LAB — GLUCOSE, CAPILLARY
GLUCOSE-CAPILLARY: 134 mg/dL — AB (ref 65–99)
GLUCOSE-CAPILLARY: 118 mg/dL — AB (ref 65–99)
GLUCOSE-CAPILLARY: 111 mg/dL — AB (ref 65–99)
Glucose-Capillary: 157 mg/dL — ABNORMAL HIGH (ref 65–99)
Glucose-Capillary: 104 mg/dL — ABNORMAL HIGH (ref 65–99)
Glucose-Capillary: 111 mg/dL — ABNORMAL HIGH (ref 65–99)

## 2018-03-10 LAB — PROTIME-INR
INR: 1.28
Prothrombin Time: 15.9 seconds — ABNORMAL HIGH (ref 11.4–15.2)

## 2018-03-10 LAB — POCT I-STAT 3, ART BLOOD GAS (G3+)
Bicarbonate: 25.4 mmol/L (ref 20.0–28.0)
O2 Saturation: 99 %
PCO2 ART: 42.3 mmHg (ref 32.0–48.0)
PO2 ART: 129 mmHg — AB (ref 83.0–108.0)
Patient temperature: 98.6
TCO2: 27 mmol/L (ref 22–32)
pH, Arterial: 7.387 (ref 7.350–7.450)

## 2018-03-10 LAB — TRIGLYCERIDES: TRIGLYCERIDES: 74 mg/dL (ref <150)

## 2018-03-10 LAB — BASIC METABOLIC PANEL
Anion gap: 12 (ref 5–15)
Anion gap: 8 (ref 5–15)
BUN: 13 mg/dL (ref 6–20)
BUN: 14 mg/dL (ref 6–20)
CHLORIDE: 98 mmol/L — AB (ref 101–111)
CHLORIDE: 100 mmol/L — AB (ref 101–111)
CO2: 23 mmol/L (ref 22–32)
CO2: 26 mmol/L (ref 22–32)
CREATININE: 0.84 mg/dL (ref 0.61–1.24)
Calcium: 7.2 mg/dL — ABNORMAL LOW (ref 8.9–10.3)
Calcium: 7.4 mg/dL — ABNORMAL LOW (ref 8.9–10.3)
Creatinine, Ser: 0.83 mg/dL (ref 0.61–1.24)
GFR calc non Af Amer: >60 mL/min (ref >60)
GFR calc non Af Amer: >60 mL/min (ref >60)
GLUCOSE: 161 mg/dL — AB (ref 65–99)
GLUCOSE: 111 mg/dL — AB (ref 65–99)
Potassium: 3.9 mmol/L (ref 3.5–5.1)
Potassium: 3.4 mmol/L — ABNORMAL LOW (ref 3.5–5.1)
Sodium: 133 mmol/L — ABNORMAL LOW (ref 135–145)
Sodium: 134 mmol/L — ABNORMAL LOW (ref 135–145)

## 2018-03-10 LAB — APTT: APTT: 34 s (ref 24–36)

## 2018-03-10 LAB — SURGICAL PCR SCREEN
MRSA, PCR: NEGATIVE
Staphylococcus aureus: NEGATIVE

## 2018-03-10 LAB — MAGNESIUM
Magnesium: 2.2 mg/dL (ref 1.7–2.4)
Magnesium: 2.1 mg/dL (ref 1.7–2.4)

## 2018-03-10 LAB — PHOSPHORUS
PHOSPHORUS: 4.2 mg/dL (ref 2.5–4.6)
PHOSPHORUS: 3.9 mg/dL (ref 2.5–4.6)

## 2018-03-10 LAB — PROCALCITONIN: Procalcitonin: 1.01 ng/mL

## 2018-03-10 LAB — HIV ANTIBODY (ROUTINE TESTING W REFLEX): HIV SCREEN 4TH GENERATION: NONREACTIVE

## 2018-03-10 MED ORDER — TRANEXAMIC ACID (OHS) BOLUS VIA INFUSION
15.0000 mg/kg | INTRAVENOUS | Status: AC
  Administered 2018-03-11: 1045.5 mg via INTRAVENOUS
  Filled 2018-03-10: qty 1046

## 2018-03-10 MED ORDER — MAGNESIUM SULFATE 50 % IJ SOLN
40.0000 meq | INTRAMUSCULAR | Status: DC
  Filled 2018-03-10: qty 9.85

## 2018-03-10 MED ORDER — PNEUMOCOCCAL VAC POLYVALENT 25 MCG/0.5ML IJ INJ: 0.5000 mL | INJECTION | INTRAMUSCULAR | Status: DC | PRN

## 2018-03-10 MED ORDER — EPINEPHRINE PF 1 MG/ML IJ SOLN
0.0000 ug/min | INTRAVENOUS | Status: DC
  Filled 2018-03-10: qty 4

## 2018-03-10 MED ORDER — CHLORHEXIDINE GLUCONATE 0.12 % MT SOLN
15.0000 mL | Freq: Once | OROMUCOSAL | Status: AC
  Administered 2018-03-11: 15 mL via OROMUCOSAL

## 2018-03-10 MED ORDER — POTASSIUM CHLORIDE 2 MEQ/ML IV SOLN
80.0000 meq | INTRAVENOUS | Status: DC
  Filled 2018-03-10: qty 40

## 2018-03-10 MED ORDER — SODIUM CHLORIDE 0.9 % IV SOLN
INTRAVENOUS | Status: DC
  Filled 2018-03-10: qty 30

## 2018-03-10 MED ORDER — SODIUM CHLORIDE 0.9 % IV SOLN
750.0000 mg | INTRAVENOUS | Status: DC
  Filled 2018-03-10: qty 750

## 2018-03-10 MED ORDER — PLASMA-LYTE 148 IV SOLN
INTRAVENOUS | Status: DC
  Filled 2018-03-10: qty 2.5

## 2018-03-10 MED ORDER — NITROGLYCERIN IN D5W 200-5 MCG/ML-% IV SOLN
2.0000 ug/min | INTRAVENOUS | Status: DC
  Filled 2018-03-10: qty 250

## 2018-03-10 MED ORDER — METOPROLOL TARTRATE 12.5 MG HALF TABLET
12.5000 mg | ORAL_TABLET | Freq: Once | ORAL | Status: AC
  Administered 2018-03-11: 12.5 mg via ORAL
  Filled 2018-03-10: qty 1

## 2018-03-10 MED ORDER — NOREPINEPHRINE BITARTRATE 1 MG/ML IV SOLN
0.0000 ug/min | INTRAVENOUS | Status: DC
  Administered 2018-03-10: 5 ug/min via INTRAVENOUS
  Administered 2018-03-10: 20 ug/min via INTRAVENOUS
  Filled 2018-03-10 (×3): qty 16

## 2018-03-10 MED ORDER — POTASSIUM CHLORIDE 10 MEQ/50ML IV SOLN
10.0000 meq | INTRAVENOUS | Status: AC
  Administered 2018-03-10 – 2018-03-11 (×6): 10 meq via INTRAVENOUS
  Filled 2018-03-10 (×6): qty 50

## 2018-03-10 MED ORDER — DEXMEDETOMIDINE HCL IN NACL 400 MCG/100ML IV SOLN
0.1000 ug/kg/h | INTRAVENOUS | Status: DC
  Filled 2018-03-10: qty 100

## 2018-03-10 MED ORDER — CHLORHEXIDINE GLUCONATE CLOTH 2 % EX PADS
6.0000 | MEDICATED_PAD | Freq: Once | CUTANEOUS | Status: AC
  Administered 2018-03-10: 6 via TOPICAL

## 2018-03-10 MED ORDER — DOBUTAMINE IN D5W 4-5 MG/ML-% IV SOLN
5.0000 ug/kg/min | INTRAVENOUS | Status: DC
  Administered 2018-03-10: 5 ug/kg/min via INTRAVENOUS
  Filled 2018-03-10: qty 250

## 2018-03-10 MED ORDER — DOPAMINE-DEXTROSE 3.2-5 MG/ML-% IV SOLN
0.0000 ug/kg/min | INTRAVENOUS | Status: DC
  Filled 2018-03-10: qty 250

## 2018-03-10 MED ORDER — ORAL CARE MOUTH RINSE
15.0000 mL | OROMUCOSAL | Status: DC
  Administered 2018-03-10 – 2018-03-11 (×11): 15 mL via OROMUCOSAL

## 2018-03-10 MED ORDER — SODIUM CHLORIDE 0.9 % IV SOLN
30.0000 ug/min | INTRAVENOUS | Status: DC
  Filled 2018-03-10 (×2): qty 2

## 2018-03-10 MED ORDER — SODIUM CHLORIDE 0.9 % IV SOLN
INTRAVENOUS | Status: DC
  Filled 2018-03-10: qty 1

## 2018-03-10 MED ORDER — TRANEXAMIC ACID 1000 MG/10ML IV SOLN
1.5000 mg/kg/h | INTRAVENOUS | Status: AC
  Administered 2018-03-11: 1.5 mg/kg/h via INTRAVENOUS
  Filled 2018-03-10: qty 25

## 2018-03-10 MED ORDER — VANCOMYCIN HCL 10 G IV SOLR
1250.0000 mg | INTRAVENOUS | Status: DC
  Filled 2018-03-10: qty 1250

## 2018-03-10 MED ORDER — TRANEXAMIC ACID (OHS) PUMP PRIME SOLUTION
2.0000 mg/kg | INTRAVENOUS | Status: DC
  Filled 2018-03-10: qty 1.39

## 2018-03-10 MED ORDER — PROPOFOL 1000 MG/100ML IV EMUL
5.0000 ug/kg/min | INTRAVENOUS | Status: DC
  Administered 2018-03-09: via INTRAVENOUS
  Administered 2018-03-10 (×2): 30 ug/kg/min via INTRAVENOUS
  Administered 2018-03-11 (×2): 35 ug/kg/min via INTRAVENOUS
  Filled 2018-03-10 (×5): qty 100

## 2018-03-10 MED ORDER — MUPIROCIN 2 % EX OINT: 1.0000 "application " | TOPICAL_OINTMENT | Freq: Two times a day (BID) | CUTANEOUS | Status: DC

## 2018-03-10 MED ORDER — POTASSIUM CHLORIDE 10 MEQ/50ML IV SOLN
10.0000 meq | INTRAVENOUS | Status: AC
  Administered 2018-03-10 (×5): 10 meq via INTRAVENOUS
  Filled 2018-03-10 (×5): qty 50

## 2018-03-10 MED ORDER — SODIUM CHLORIDE 0.9 % IV SOLN
1.5000 g | INTRAVENOUS | Status: AC
  Administered 2018-03-11: 1.5 g via INTRAVENOUS
  Filled 2018-03-10: qty 1.5

## 2018-03-10 MED ORDER — CHLORHEXIDINE GLUCONATE CLOTH 2 % EX PADS
6.0000 | MEDICATED_PAD | Freq: Once | CUTANEOUS | Status: AC
  Administered 2018-03-11: 6 via TOPICAL

## 2018-03-10 NOTE — Plan of Care (Signed)
Patient work of breathing necessitated intubation last night. He is currently on levophed, lasix, fentanyl, propofol,  to  maintain hemodynamic parameters and optimize sedation for optimal ventilation. Urine output .75 cc per kg on lasix,  plan is for surgical intervention to fix aortic valve that is vegetated/ infectious. On vancomycin and rocephin. WBC climbing.  T max 100.4

## 2018-03-10 NOTE — Procedures (Signed)
Central Venous Catheter Insertion Procedure Note John Byrd [id number] 1961-07-31  Procedure: Insertion of Central Venous Catheter Indications: Assessment of intravascular volume, Drug and/or fluid administration and Frequent blood sampling  Procedure Details Consent: Unable to obtain consent because of emergent medical necessity. Time Out: Verified patient identification, verified procedure, site/side was marked, verified correct patient position, special equipment/implants available, medications/allergies/relevent history reviewed, required imaging and test results available.  Performed  Maximum sterile technique was used including antiseptics, cap, gloves, gown, hand hygiene, mask and sheet. Skin prep: Chlorhexidine; local anesthetic administered A antimicrobial bonded/coated triple lumen catheter was placed in the right internal jugular vein using the Seldinger technique.  Evaluation Blood flow good Complications: No apparent complications Patient did tolerate procedure well. Chest X-ray ordered to verify placement.  CXR: pending.  John Byrd, AGACNP-BC [facility]  Pgr: [id number]  PCCM Pgr: [phone]

## 2018-03-10 NOTE — Progress Notes (Signed)
  Subjective: Intubated, sedated  Objective: Vital signs in last 24 hours: Temp:  [98.4 F (36.9 C)-100.4 F (38 C)] 99.7 F (37.6 C) (04/07 1300) Pulse Rate:  [61-112] 87 (04/07 1300) Cardiac Rhythm: Normal sinus rhythm (04/07 0800) Resp:  [13-49] 27 (04/07 1300) BP: (86-134)/(46-73) 106/47 (04/07 1300) SpO2:  [87 %-100 %] 93 % (04/07 1300) Arterial Line BP: (102-156)/(31-58) 111/43 (04/07 1300) FiO2 (%):  [70 %-100 %] 70 % (04/07 1119) Weight:  [153 lb 10.6 oz (69.7 kg)-167 lb 15.9 oz (76.2 kg)] 153 lb 10.6 oz (69.7 kg) (04/07 0500)  Hemodynamic parameters for last 24 hours: CVP:  [9 mmHg-15 mmHg] 14 mmHg  Intake/Output from previous day: 04/06 0701 - 04/07 0700 In: 1947.6 [I.V.:687.6; NG/GT:60; IV Piggyback:1200] Out: 3860 [Urine:3860] Intake/Output this shift: Total I/O In: 474.2 [I.V.:474.2] Out: 295 [Urine:295]  General appearance: sedated Heart: regular rate and rhythm and 3/6 diastolic murmur Lungs: relatively clear anteriorly at present Extremities: edema 2+ and warm and dry  Lab Results: Recent Labs    03/09/18 1849 03/10/18 0456  WBC 16.7* 22.1*  HGB 10.5* 9.8*  HCT 32.5* 30.9*  PLT 334 358   BMET:  Recent Labs    03/09/18 1849 03/10/18 0456  NA 130* 133*  K 3.2* 3.9  CL 96* 98*  CO2 22 23  GLUCOSE 107* 161*  BUN 11 13  CREATININE 0.71 0.84  CALCIUM 7.2* 7.2*    PT/INR: No results for input(s): LABPROT, INR in the last 72 hours. ABG    Component Value Date/Time   PHART 7.387 03/10/2018 0039   HCO3 25.4 03/10/2018 0039   TCO2 27 03/10/2018 0039   O2SAT 99.0 03/10/2018 0039   CBG (last 3)  Recent Labs    03/10/18 0332 03/10/18 0846 03/10/18 1254  GLUCAP 157* 134* 118*    Assessment/Plan: S/P  -Intubated last night. SNP stopped when BP dropped with intubation His CXR looked a little better on the left today after diuresing 2 liters but still on 70% and 12 PEEP I am concerned we will not be able to adequately ventilate him and  will fail to come off bypass in current state Will add dobutamine for inotropic support and afterload reduction and see if we can get more volume off him today   LOS: 1 day    Loreli SlotSteven C Billie Trager 03/10/2018

## 2018-03-10 NOTE — Progress Notes (Signed)
eLink Physician-Brief Progress Note Patient Name: John KrebsValentino Santiago Byrd DOB: 06-15-1961 MRN: [id number]   Date of Service  03/10/2018  HPI/Events of Note  Hypokalemia - K+ = 3.4 and Creatinine = 0.83. Patient is currently on a Lasix IV infusion.   eICU Interventions  Will replace K+.      Intervention Category Major Interventions: Electrolyte abnormality - evaluation and management  Payeton Germani Eugene 03/10/2018, 9:45 PM

## 2018-03-10 NOTE — Progress Notes (Signed)
PULMONARY / CRITICAL CARE MEDICINE   Name: John Byrd MRN: [id number] DOB: [birth year]    ADMISSION DATE:  03/09/2018 CONSULTATION DATE:  4/6/419  REFERRING MD:  Enedina Finner  CHIEF COMPLAINT:  dyspnea  HISTORY OF PRESENT ILLNESS:   57 year old male nonsmoker with no known past medical history who presents 4/6 with 3 weeks of progressive dyspnea, lower extremity edema and nonproductive cough.  He had a chest x-ray performed 1 week prior to admit for this complaint which showed multifocal airspace opacities however he has not followed up for additional care for this.  He denies fevers, night sweats, weight loss, chest pain, nausea, vomiting, diarrhea, myalgias, sick contacts.  No sore throat.  Did report that he was having occasional chills a couple of months ago that improved with taking over-the-counter medicine.  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  He is from [place] though his girlfriend lives here.    In the emergency department he was noted to be acutely tachypneic, oxygen saturation in the 80s on room air which did not improve with a nonrebreather and so he was started on BiPAP and given a dose of Lasix, ceftriaxone and azithromycin.     SUBJECTIVE:  Progressive respiratory failure overnight requiring intubation.  TTE showed severe AI with vegetation.  On Lasix drip-urine output trending down On levophed-weaning   VITAL SIGNS: BP (!) 105/53 (BP Location: Left Arm)   Pulse 82   Temp 99.3 F (37.4 C)   Resp 19   Ht 5\' 3"  (1.6 m)   Wt 69.7 kg (153 lb 10.6 oz)   SpO2 92%   BMI 27.22 kg/m   HEMODYNAMICS: CVP:  [9 mmHg-11 mmHg] 11 mmHg  VENTILATOR SETTINGS: Vent Mode: PRVC FiO2 (%):  [70 %-100 %] 80 % Set Rate:  [20 bmp] 20 bmp Vt Set:  [500 mL] 500 mL PEEP:  [5 cmH20-12 cmH20] 12 cmH20 Pressure Support:  [10 cmH20-15 cmH20] 15 cmH20 Plateau Pressure:  [22 cmH20-28 cmH20]  22 cmH20  INTAKE / OUTPUT: I/O last 3 completed shifts: In: 1947.6 [I.V.:687.6; NG/GT:60; IV Piggyback:1200] Out: 3840 [Urine:3840]  PHYSICAL EXAMINATION: General: Acutely ill-appearing Hispanic male, no acute distress Neuro: Sedated on vent, RASS -2 mm moist, ETT HEENT: ETT, + JVD Cardiovascular: S1-S2, + M Lungs: Respirations are even and nonlabored on vent, coarse, bibasilar crackles Abdomen: Round, soft, hypoactive bowel sounds Musculoskeletal: Warm and dry, 1-2+ BLE edema   LABS:  BMET Recent Labs  Lab 03/09/18 1120 03/09/18 1849 03/10/18 0456  NA 128* 130* 133*  K 3.3* 3.2* 3.9  CL 95* 96* 98*  CO2 20* 22 23  BUN 12 11 13   CREATININE 0.81 0.71 0.84  GLUCOSE 125* 107* 161*    Electrolytes Recent Labs  Lab 03/09/18 1120 03/09/18 1849 03/10/18 0456  CALCIUM 7.7* 7.2* 7.2*  MG  --  2.1 2.2  PHOS  --  3.9 4.2    CBC Recent Labs  Lab 03/09/18 1120 03/09/18 1849 03/10/18 0456  WBC 15.9* 16.7* 22.1*  HGB 10.9* 10.5* 9.8*  HCT 32.1* 32.5* 30.9*  PLT 359 334 358    Coag's No results for input(s): APTT, INR in the last 168 hours.  Sepsis Markers Recent Labs  Lab 03/09/18 1849  LATICACIDVEN 1.1  PROCALCITON 0.29    ABG Recent Labs  Lab 03/09/18 1838 03/10/18 0039  PHART 7.411 7.387  PCO2ART 37.7 42.3  PO2ART 91.0 129.0*    Liver Enzymes Recent  Labs  Lab 03/09/18 1108 03/09/18 1849  AST 74* 84*  ALT 95* 94*  ALKPHOS 151* 147*  BILITOT 1.8* 1.7*  ALBUMIN 1.9* 1.8*    Cardiac Enzymes No results for input(s): TROPONINI, PROBNP in the last 168 hours.  Glucose Recent Labs  Lab 03/09/18 2131 03/10/18 0332 03/10/18 0846  GLUCAP 114* 157* 134*    Imaging Dg Chest 2 View  Result Date: 03/09/2018 CLINICAL DATA:  SOB Pt is having SOB and CP x 2 days. Pt was on O2 at time of exam. EXAM: CHEST - 2 VIEW COMPARISON:  02/26/2018 FINDINGS: There are interstitial and airspace opacities throughout the entire right lung and within the left  lung centered in the perihilar and lower lung, partly silhouetting the left heart border. The distribution of airspace opacities is less centrally predominant than it was on the prior study. No pleural effusion or pneumothorax. Heart is mildly enlarged. No convincing mediastinal or hilar masses. Hila are partly obscured by the contiguous lung opacities. Skeletal structures are intact. IMPRESSION: 1. Bilateral interstitial and airspace lung opacities, similar to the prior chest radiograph although less centrally predominant. Multifocal pneumonia suspected. Electronically Signed   By: Amie Portlandavid  Ormond M.D.   On: 03/09/2018 11:57   Portable Chest X-ray  Result Date: 03/10/2018 CLINICAL DATA:  Endotracheal and central line placements EXAM: PORTABLE CHEST 1 VIEW COMPARISON:  03/09/2018 FINDINGS: Endotracheal tube is been placed with tip measuring 3.4 cm above the carina. Right central venous catheter with tip over the mid SVC region. No pneumothorax. Shallow inspiration. Mild cardiac enlargement. Bilateral perihilar infiltrates could indicate edema, pneumonia, or ARDS. Increased density in the right apex could represent pleural fluid. This could be due to a layering effusion. In the setting of central venous catheter placement, hematoma is not excluded. IMPRESSION: Appliances appear in satisfactory position. Developing fluid in the right apex may represent layering pleural effusion or possibly hematoma. Persistent bilateral pulmonary infiltrates. Electronically Signed   By: Burman NievesWilliam  Stevens M.D.   On: 03/10/2018 00:58   Dg Abd Portable 1v  Result Date: 03/10/2018 CLINICAL DATA:  OG tube placement EXAM: PORTABLE ABDOMEN - 1 VIEW COMPARISON:  None. FINDINGS: Enteric tube tip is in the left upper quadrant consistent with location in the body of the stomach. Gas-filled small and large bowel without significant distention. Diffuse infiltrates in the lungs. IMPRESSION: Enteric tube tip is in the left upper quadrant  consistent with location in the body of the stomach. Electronically Signed   By: Burman NievesWilliam  Stevens M.D.   On: 03/10/2018 00:58     STUDIES:  2D echo 4/6>>> severe AI, aortic valve vegetation   CULTURES: BC x 2 4/6>>> BC x 2 4/7>>>  ANTIBIOTICS: vanc 4/6>>> Rocephin 4/6>>> azithro 4/6>>>   SIGNIFICANT EVENTS:   LINES/TUBES: ETT 4/6>>> R IJ CVL 4/6>>>   DISCUSSION: 57 year old male with no known history admitted 4/7 with acute respiratory failure, found to have acute diastolic CHF due to aortic insufficiency in the setting of subacute bacterial endocarditis.  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 for now Follow-up KUB Diuresis as above  HEMATOLOGIC Anemia P:  Follow-up CBC Lovenox  INFECTIOUS Subacute bacterial endocarditis.  No known indwelling hardware or history of IV drug abuse. P:   Cultures pending-negative to date Follow-up procalcitonin Continue antibiotics as above-if cultures remain negative can likely DC azithro in a.m.   ENDOCRINE No active issue P:   Monitor glucose on chemistries  NEUROLOGIC Sedation needs on vent P:   RASS goal: -1 Continue propofol, fentanyl drips Daily wake up assessment   FAMILY  - Updates: Girlfriend at bedside  - Inter-disciplinary  family meet or Palliative Care meeting due by:  Day 7   Dirk Dress, NP 03/10/2018  9:06 AM Pager: [phone] or [phone]

## 2018-03-10 NOTE — Progress Notes (Signed)
Dr Dorris FetchHendrickson in, used interpreter line to speak to girlfriend. Patient had verbally consented to OR procedure when Dr Dorris FetchHendrickson  explained the procedure last evening witnessed by myself.  Girlfriend understands risks. Asked questions via interpreter. Patient increasing urine output after dobutamine, able to wean down levophed to off for short time, but had to put back on due to diastolic low.

## 2018-03-10 NOTE — Progress Notes (Signed)
GF Desma Maximntonia went home for the night, said would be back around 2pm tomorrow.

## 2018-03-10 NOTE — Consult Note (Addendum)
[facility]    Date of Admission:  03/09/2018   Total days of antibiotics: 1 vanco/ceftraiaxone               Reason for Consult: acute bacterial endocarditis    Referring Provider: Stanford Breed.    Assessment: Acute bacterial endocarditis Cardiogenic shock Sever Aortic Insufficiency  Plan: 1. Continue vanco/ceftriaxone 2. Agree with stop azithro.  3.  await BCx 4. If he has AVR, please send for routine, fungal, afb Cx 5. If he has AVR, please send specimen to path for eval at [facility] [place] for PCR for bacterial/fungal/mycobacterial pathogens.  6. HIV (-), will also check for hepatitis.    Thank you so much for this interesting consult,  Principal Problem:   Acute respiratory failure with hypoxia ([place]) Active Problems:   Acute decompensated heart failure ([place])   . chlorhexidine  15 mL Mouth Rinse BID  . docusate  100 mg Per Tube BID  . enoxaparin (LOVENOX) injection  40 mg Subcutaneous Q24H  . famotidine  20 mg Per Tube BID  . insulin aspart  0-15 Units Subcutaneous Q4H  . mouth rinse  15 mL Mouth Rinse 10 times per day    HPI: John Byrd is a 57 y.o. male with no pMHx (on lasix PTA?), immigrated from [place], adm to [facility] on 4-6 with 3 weeks of worsening SOB and LE edema. He required intubation. He underwent TTE and was found to have vegetation on his Ao valve and severe aortic valve insufficieny.  He has required pressor support as well.  He has had Tmax of 1002.  His WBC was 15.9. He had BCx sent which are pending.  He was started on vanco/ceftriaxone.  He is being planned for AVR.   Review of Systems: Review of Systems  Unable to perform ROS: Intubated  Constitutional: Negative for chills and fever.  Respiratory: Positive for cough and shortness of breath. Negative for sputum production.   Cardiovascular: Positive for leg swelling. Negative for chest pain.    Past Medical History:  Diagnosis Date  . CHF  (congestive heart failure) (HCC)     Social History   Tobacco Use  . Smoking status: Never Smoker  . Smokeless tobacco: Never Used  Substance Use Topics  . Alcohol use: Yes  . Drug use: Not Currently    History reviewed. No pertinent family history.   Medications:  Scheduled: . chlorhexidine  15 mL Mouth Rinse BID  . docusate  100 mg Per Tube BID  . enoxaparin (LOVENOX) injection  40 mg Subcutaneous Q24H  . famotidine  20 mg Per Tube BID  . insulin aspart  0-15 Units Subcutaneous Q4H  . mouth rinse  15 mL Mouth Rinse 10 times per day    Abtx:  Anti-infectives (From admission, onward)   Start     Dose/Rate Route Frequency Ordered Stop   03/10/18 1800  cefTRIAXone (ROCEPHIN) 2 g in sodium chloride 0.9 % 100 mL IVPB     2 g 200 mL/hr over 30 Minutes Intravenous Every 24 hours 03/09/18 1756     03/10/18 0400  vancomycin (VANCOCIN) IVPB 750 mg/150 ml premix     750 mg 150 mL/hr over 60 Minutes Intravenous Every 8 hours 03/09/18 1719     03/09/18 1800  cefTRIAXone (ROCEPHIN) 1 g in sodium chloride 0.9 % 100 mL IVPB     1 g 200 mL/hr over 30 Minutes Intravenous  Once  03/09/18 1756 03/09/18 1925   03/09/18 1730  vancomycin (VANCOCIN) 1,500 mg in sodium chloride 0.9 % 500 mL IVPB     1,500 mg 250 mL/hr over 120 Minutes Intravenous  Once 03/09/18 1717 03/09/18 2303   03/09/18 1330  cefTRIAXone (ROCEPHIN) 1 g in sodium chloride 0.9 % 100 mL IVPB     1 g 200 mL/hr over 30 Minutes Intravenous  Once 03/09/18 1318 03/09/18 1508   03/09/18 1330  azithromycin (ZITHROMAX) 500 mg in sodium chloride 0.9 % 250 mL IVPB     500 mg 250 mL/hr over 60 Minutes Intravenous  Once 03/09/18 1318 03/09/18 1634        OBJECTIVE: Blood pressure (!) 109/56, pulse 82, temperature 99.9 F (37.7 C), resp. rate (!) 32, height '5\' 3"'  (1.6 m), weight 69.7 kg (153 lb 10.6 oz), SpO2 93 %.  Physical Exam  Constitutional: No distress.  HENT:  intubated  Eyes:  Pupils =, no injection  Neck: Neck  supple.  Cardiovascular: Normal rate and regular rhythm.  Murmur heard. Pulmonary/Chest: He has rales.  Abdominal: Soft. Bowel sounds are normal. There is no tenderness. There is no rebound.  Musculoskeletal: He exhibits edema.  Lymphadenopathy:    He has no cervical adenopathy.  Skin: No lesion noted. He is not diaphoretic.  no nail bed or digit/pedal lesions.  R neck IJ clean.   Lab Results Results for orders placed or performed during the hospital encounter of 03/09/18 (from the past 48 hour(s))  Hepatic function panel     Status: Abnormal   Collection Time: 03/09/18 11:08 AM  Result Value Ref Range   Total Protein 7.8 6.5 - 8.1 g/dL   Albumin 1.9 (L) 3.5 - 5.0 g/dL   AST 74 (H) 15 - 41 U/L   ALT 95 (H) 17 - 63 U/L   Alkaline Phosphatase 151 (H) 38 - 126 U/L   Total Bilirubin 1.8 (H) 0.3 - 1.2 mg/dL   Bilirubin, Direct 0.6 (H) 0.1 - 0.5 mg/dL   Indirect Bilirubin 1.2 (H) 0.3 - 0.9 mg/dL    Comment: Performed at [facility], [address], [place], [place] [address]  Basic metabolic panel     Status: Abnormal   Collection Time: 03/09/18 11:20 AM  Result Value Ref Range   Sodium 128 (L) 135 - 145 mmol/L   Potassium 3.3 (L) 3.5 - 5.1 mmol/L   Chloride 95 (L) 101 - 111 mmol/L   CO2 20 (L) 22 - 32 mmol/L   Glucose, Bld 125 (H) 65 - 99 mg/dL   BUN 12 6 - 20 mg/dL   Creatinine, Ser 0.81 0.61 - 1.24 mg/dL   Calcium 7.7 (L) 8.9 - 10.3 mg/dL   GFR calc non Af Amer >60 >60 mL/min   GFR calc Af Amer >60 >60 mL/min    Comment: (NOTE) The eGFR has been calculated using the CKD EPI equation. This calculation has not been validated in all clinical situations. eGFR's persistently <60 mL/min signify possible Chronic Kidney Disease.    Anion gap 13 5 - 15    Comment: Performed at [facility] [address], [place] [address]  CBC     Status: Abnormal   Collection Time: 03/09/18 11:20 AM  Result Value Ref Range   WBC 15.9 (H) 4.0 - 10.5 K/uL   RBC 3.58 (L)  4.22 - 5.81 MIL/uL   Hemoglobin 10.9 (L) 13.0 - 17.0 g/dL   HCT 32.1 (L) 39.0 - 52.0 %  MCV 89.7 78.0 - 100.0 fL   MCH 30.4 26.0 - 34.0 pg   MCHC 34.0 30.0 - 36.0 g/dL   RDW 15.3 11.5 - 15.5 %   Platelets 359 150 - 400 K/uL    Comment: Performed at [facility], [place] [address], [place], [place] [address]  I-stat troponin, ED     Status: None   Collection Time: 03/09/18 11:26 AM  Result Value Ref Range   Troponin i, poc 0.02 0.00 - 0.08 ng/mL   Comment 3            Comment: Due to the release kinetics of cTnI, a negative result within the first hours of the onset of symptoms does not rule out myocardial infarction with certainty. If myocardial infarction is still suspected, repeat the test at appropriate intervals.   Brain natriuretic peptide     Status: Abnormal   Collection Time: 03/09/18 11:58 AM  Result Value Ref Range   B Natriuretic Peptide 721.6 (H) 0.0 - 100.0 pg/mL    Comment: Performed at [facility] [address] [place], [place] [address]  Type and screen [place]     Status: None   Collection Time: 03/09/18  4:10 PM  Result Value Ref Range   ABO/RH(D) B POS    Antibody Screen NEG    Sample Expiration      03/12/2018 Performed at [facility], [place] [address], [place], [place] [address]   ABO/Rh     Status: None   Collection Time: 03/09/18  4:10 PM  Result Value Ref Range   ABO/RH(D)      B POS Performed at [facility] [address], [place], [place] [address]   Respiratory Panel by PCR     Status: None   Collection Time: 03/09/18  6:04 PM  Result Value Ref Range   Adenovirus NOT DETECTED NOT DETECTED   Coronavirus 229E NOT DETECTED NOT DETECTED   Coronavirus HKU1 NOT DETECTED NOT DETECTED   Coronavirus NL63 NOT DETECTED NOT DETECTED   Coronavirus OC43 NOT DETECTED NOT DETECTED   Metapneumovirus NOT DETECTED NOT DETECTED   Rhinovirus / Enterovirus NOT DETECTED NOT DETECTED   Influenza A NOT DETECTED NOT  DETECTED   Influenza B NOT DETECTED NOT DETECTED   Parainfluenza Virus 1 NOT DETECTED NOT DETECTED   Parainfluenza Virus 2 NOT DETECTED NOT DETECTED   Parainfluenza Virus 3 NOT DETECTED NOT DETECTED   Parainfluenza Virus 4 NOT DETECTED NOT DETECTED   Respiratory Syncytial Virus NOT DETECTED NOT DETECTED   Bordetella pertussis NOT DETECTED NOT DETECTED   Chlamydophila pneumoniae NOT DETECTED NOT DETECTED   Mycoplasma pneumoniae NOT DETECTED NOT DETECTED    Comment: Performed at [facility] [address], [place], [place] [address]  MRSA PCR Screening     Status: None   Collection Time: 03/09/18  6:04 PM  Result Value Ref Range   MRSA by PCR NEGATIVE NEGATIVE    Comment:        The GeneXpert MRSA Assay (FDA approved for NASAL specimens only), is one component of a comprehensive MRSA colonization surveillance program. It is not intended to diagnose MRSA infection nor to guide or monitor treatment for MRSA infections. Performed at [facility], [place] [address] [place], [place] [address]   I-STAT 3, arterial blood gas (G3+)     Status: None   Collection Time: 03/09/18  6:38 PM  Result Value Ref  Range   pH, Arterial 7.411 7.350 - 7.450   pCO2 arterial 37.7 32.0 - 48.0 mmHg   pO2, Arterial 91.0 83.0 - 108.0 mmHg   Bicarbonate 24.0 20.0 - 28.0 mmol/L   TCO2 25 22 - 32 mmol/L   O2 Saturation 97.0 %   Patient temperature 36.9 C    Collection site ARTERIAL LINE    Drawn by RT    Sample type ARTERIAL   HIV antibody (Routine Testing)     Status: None   Collection Time: 03/09/18  6:49 PM  Result Value Ref Range   HIV Screen 4th Generation wRfx Non Reactive Non Reactive    Comment: (NOTE) Performed At: [facility] [place], [place] [id number] Rush Farmer MD [id number] Performed at [facility], [place] [address], [place], [place] [address]   CBC     Status: Abnormal   Collection Time: 03/09/18  6:49 PM  Result Value Ref Range   WBC  16.7 (H) 4.0 - 10.5 K/uL   RBC 3.59 (L) 4.22 - 5.81 MIL/uL   Hemoglobin 10.5 (L) 13.0 - 17.0 g/dL   HCT 32.5 (L) 39.0 - 52.0 %   MCV 90.5 78.0 - 100.0 fL   MCH 29.2 26.0 - 34.0 pg   MCHC 32.3 30.0 - 36.0 g/dL   RDW 15.5 11.5 - 15.5 %   Platelets 334 150 - 400 K/uL    Comment: Performed at [facility], [place] [address], [place], [place] [address]  Basic metabolic panel     Status: Abnormal   Collection Time: 03/09/18  6:49 PM  Result Value Ref Range   Sodium 130 (L) 135 - 145 mmol/L   Potassium 3.2 (L) 3.5 - 5.1 mmol/L   Chloride 96 (L) 101 - 111 mmol/L   CO2 22 22 - 32 mmol/L   Glucose, Bld 107 (H) 65 - 99 mg/dL   BUN 11 6 - 20 mg/dL   Creatinine, Ser 0.71 0.61 - 1.24 mg/dL   Calcium 7.2 (L) 8.9 - 10.3 mg/dL   GFR calc non Af Amer >60 >60 mL/min   GFR calc Af Amer >60 >60 mL/min    Comment: (NOTE) The eGFR has been calculated using the CKD EPI equation. This calculation has not been validated in all clinical situations. eGFR's persistently <60 mL/min signify possible Chronic Kidney Disease.    Anion gap 12 5 - 15    Comment: Performed at [facility] [address], [place], [place] [address]  Magnesium     Status: None   Collection Time: 03/09/18  6:49 PM  Result Value Ref Range   Magnesium 2.1 1.7 - 2.4 mg/dL    Comment: Performed at [facility], [place] [address] [place], [place] [address]  Phosphorus     Status: None   Collection Time: 03/09/18  6:49 PM  Result Value Ref Range   Phosphorus 3.9 2.5 - 4.6 mg/dL    Comment: Performed at [facility] [address], [place], [place] [address]  Hemoglobin A1c     Status: Abnormal   Collection Time: 03/09/18  6:49 PM  Result Value Ref Range   Hgb A1c MFr Bld 5.8 (H) 4.8 - 5.6 %    Comment: (NOTE) Pre diabetes:          5.7%-6.4% Diabetes:              >6.4% Glycemic control for   <7.0% adults with diabetes  Mean Plasma Glucose 119.76 mg/dL    Comment: Performed at [facility] [address] [place], [place] [address]  Hepatic function panel     Status: Abnormal   Collection Time: 03/09/18  6:49 PM  Result Value Ref Range   Total Protein 7.2 6.5 - 8.1 g/dL   Albumin 1.8 (L) 3.5 - 5.0 g/dL   AST 84 (H) 15 - 41 U/L   ALT 94 (H) 17 - 63 U/L   Alkaline Phosphatase 147 (H) 38 - 126 U/L   Total Bilirubin 1.7 (H) 0.3 - 1.2 mg/dL   Bilirubin, Direct 0.8 (H) 0.1 - 0.5 mg/dL   Indirect Bilirubin 0.9 0.3 - 0.9 mg/dL    Comment: Performed at [facility] [address] [place], [place] [address]  Lactic acid, plasma     Status: None   Collection Time: 03/09/18  6:49 PM  Result Value Ref Range   Lactic Acid, Venous 1.1 0.5 - 1.9 mmol/L    Comment: Performed at [facility] [address] [place], [place] [address]  Procalcitonin - Baseline     Status: None   Collection Time: 03/09/18  6:49 PM  Result Value Ref Range   Procalcitonin 0.29 ng/mL    Comment:        Interpretation: PCT (Procalcitonin) <= 0.5 ng/mL: Systemic infection (sepsis) is not likely. Local bacterial infection is possible. (NOTE)       Sepsis PCT Algorithm           Lower Respiratory Tract                                      Infection PCT Algorithm    ----------------------------     ----------------------------         PCT < 0.25 ng/mL                PCT < 0.10 ng/mL         Strongly encourage             Strongly discourage   discontinuation of antibiotics    initiation of antibiotics    ----------------------------     -----------------------------       PCT 0.25 - 0.50 ng/mL            PCT 0.10 - 0.25 ng/mL               OR       >80% decrease in PCT            Discourage initiation of                                            antibiotics      Encourage discontinuation           of antibiotics    ----------------------------     -----------------------------         PCT >= 0.50 ng/mL              PCT 0.26 - 0.50 ng/mL               AND        <80% decrease in PCT              Encourage initiation  of                                             antibiotics       Encourage continuation           of antibiotics    ----------------------------     -----------------------------        PCT >= 0.50 ng/mL                  PCT > 0.50 ng/mL               AND         increase in PCT                  Strongly encourage                                      initiation of antibiotics    Strongly encourage escalation           of antibiotics                                     -----------------------------                                           PCT <= 0.25 ng/mL                                                 OR                                        > 80% decrease in PCT                                     Discontinue / Do not initiate                                             antibiotics Performed at [facility], [address] [place], [place] [address]   Urinalysis, Routine w reflex microscopic     Status: Abnormal   Collection Time: 03/09/18  9:21 PM  Result Value Ref Range   Color, Urine YELLOW YELLOW   APPearance CLEAR CLEAR   Specific Gravity, Urine 1.008 1.005 - 1.030   pH 7.0 5.0 - 8.0   Glucose, UA NEGATIVE NEGATIVE mg/dL   Hgb urine dipstick SMALL (A) NEGATIVE   Bilirubin Urine NEGATIVE NEGATIVE   Ketones, ur 5 (A) NEGATIVE mg/dL   Protein, ur NEGATIVE NEGATIVE mg/dL   Nitrite NEGATIVE NEGATIVE   Leukocytes, UA NEGATIVE NEGATIVE   RBC / HPF 0-5 0 - 5 RBC/hpf   WBC, UA 0-5  0 - 5 WBC/hpf   Bacteria, UA NONE SEEN NONE SEEN   Squamous Epithelial / LPF 0-5 (A) NONE SEEN   Mucus PRESENT     Comment: Performed at [facility], [place] [address], [place], [place] [address]  Urine rapid drug screen (hosp performed)     Status: None   Collection Time: 03/09/18  9:21 PM  Result Value Ref Range   Opiates NONE DETECTED NONE DETECTED   Cocaine NONE DETECTED NONE DETECTED   Benzodiazepines NONE DETECTED NONE DETECTED   Amphetamines NONE  DETECTED NONE DETECTED   Tetrahydrocannabinol NONE DETECTED NONE DETECTED   Barbiturates NONE DETECTED NONE DETECTED    Comment: (NOTE) DRUG SCREEN FOR MEDICAL PURPOSES ONLY.  IF CONFIRMATION IS NEEDED FOR ANY PURPOSE, NOTIFY LAB WITHIN 5 DAYS. LOWEST DETECTABLE LIMITS FOR URINE DRUG SCREEN Drug Class                     Cutoff (ng/mL) Amphetamine and metabolites    1000 Barbiturate and metabolites    200 Benzodiazepine                 336 Tricyclics and metabolites     300 Opiates and metabolites        300 Cocaine and metabolites        300 THC                            50 Performed at [facility], [place] [address] [place], [place] [address]   Glucose, capillary     Status: Abnormal   Collection Time: 03/09/18  9:31 PM  Result Value Ref Range   Glucose-Capillary 114 (H) 65 - 99 mg/dL   Comment 1 Notify RN   I-STAT 3, arterial blood gas (G3+)     Status: Abnormal   Collection Time: 03/10/18 12:39 AM  Result Value Ref Range   pH, Arterial 7.387 7.350 - 7.450   pCO2 arterial 42.3 32.0 - 48.0 mmHg   pO2, Arterial 129.0 (H) 83.0 - 108.0 mmHg   Bicarbonate 25.4 20.0 - 28.0 mmol/L   TCO2 27 22 - 32 mmol/L   O2 Saturation 99.0 %   Patient temperature 98.6 F    Collection site ARTERIAL LINE    Drawn by Operator    Sample type ARTERIAL   Triglycerides     Status: None   Collection Time: 03/10/18 12:59 AM  Result Value Ref Range   Triglycerides 74 <150 mg/dL    Comment: Performed at [facility], [place] [address], [place], [place] [address]  Glucose, capillary     Status: Abnormal   Collection Time: 03/10/18  3:32 AM  Result Value Ref Range   Glucose-Capillary 157 (H) 65 - 99 mg/dL   Comment 1 Notify RN   CBC     Status: Abnormal   Collection Time: 03/10/18  4:56 AM  Result Value Ref Range   WBC 22.1 (H) 4.0 - 10.5 K/uL   RBC 3.39 (L) 4.22 - 5.81 MIL/uL   Hemoglobin 9.8 (L) 13.0 - 17.0 g/dL   HCT 30.9 (L) 39.0 - 52.0 %   MCV 91.2 78.0 - 100.0 fL   MCH 28.9  26.0 - 34.0 pg   MCHC 31.7 30.0 - 36.0 g/dL   RDW 15.5 11.5 - 15.5 %   Platelets 358 150 - 400 K/uL    Comment: Performed at [facility],  [address], [place], [place] [address]  Basic metabolic panel     Status: Abnormal   Collection Time: 03/10/18  4:56 AM  Result Value Ref Range   Sodium 133 (L) 135 - 145 mmol/L   Potassium 3.9 3.5 - 5.1 mmol/L    Comment: DELTA CHECK NOTED   Chloride 98 (L) 101 - 111 mmol/L   CO2 23 22 - 32 mmol/L   Glucose, Bld 161 (H) 65 - 99 mg/dL   BUN 13 6 - 20 mg/dL   Creatinine, Ser 0.84 0.61 - 1.24 mg/dL   Calcium 7.2 (L) 8.9 - 10.3 mg/dL   GFR calc non Af Amer >60 >60 mL/min   GFR calc Af Amer >60 >60 mL/min    Comment: (NOTE) The eGFR has been calculated using the CKD EPI equation. This calculation has not been validated in all clinical situations. eGFR's persistently <60 mL/min signify possible Chronic Kidney Disease.    Anion gap 12 5 - 15    Comment: Performed at [facility] [address], [place], [place] [address]  Magnesium     Status: None   Collection Time: 03/10/18  4:56 AM  Result Value Ref Range   Magnesium 2.2 1.7 - 2.4 mg/dL    Comment: Performed at [facility] [address], [place], [place] [address]  Phosphorus     Status: None   Collection Time: 03/10/18  4:56 AM  Result Value Ref Range   Phosphorus 4.2 2.5 - 4.6 mg/dL    Comment: Performed at [facility] [address] [place], [place] [address]  Glucose, capillary     Status: Abnormal   Collection Time: 03/10/18  8:46 AM  Result Value Ref Range   Glucose-Capillary 134 (H) 65 - 99 mg/dL   Comment 1 Capillary Specimen    No results found for: SDES, SPECREQUEST, CULT, REPTSTATUS Dg Chest 2 View  Result Date: 03/09/2018 CLINICAL DATA:  SOB Pt is having SOB and CP x 2 days. Pt was on O2 at time of exam. EXAM: CHEST - 2 VIEW COMPARISON:  02/26/2018 FINDINGS: There are interstitial and airspace opacities throughout the entire right lung and  within the left lung centered in the perihilar and lower lung, partly silhouetting the left heart border. The distribution of airspace opacities is less centrally predominant than it was on the prior study. No pleural effusion or pneumothorax. Heart is mildly enlarged. No convincing mediastinal or hilar masses. Hila are partly obscured by the contiguous lung opacities. Skeletal structures are intact. IMPRESSION: 1. Bilateral interstitial and airspace lung opacities, similar to the prior chest radiograph although less centrally predominant. Multifocal pneumonia suspected. Electronically Signed   By: Lajean Manes M.D.   On: 03/09/2018 11:57   Portable Chest Xray  Result Date: 03/10/2018 CLINICAL DATA:  57 year old male currently intubated with respiratory failure EXAM: PORTABLE CHEST 1 VIEW COMPARISON:  Prior chest x-ray yesterday 03/09/2018 FINDINGS: The patient remains intubated. The tip of the endotracheal tube is 3.4 cm above the carina. A right IJ central venous catheter is in place with the tip overlying the mid SVC. A nasogastric tube is present with the tip below the diaphragm and off the field of view, likely within the stomach. Similar appearance of the lungs with extensive bilateral interstitial and airspace opacities. No large pleural effusion or pneumothorax identified. No significant interval change in the aeration of the lungs. No acute osseous abnormality. IMPRESSION: 1. Interval placement of a nasogastric tube. The  tube lies off the field of view, presumably in the stomach. 2. Otherwise, stable and satisfactory support apparatus. 3. Unchanged appearance of the chest with diffuse bilateral interstitial and airspace opacities more confluent on the right than the left. Findings remain concerning for either congestive heart failure with asymmetric pulmonary edema, or multifocal pneumonia. If the abnormalities persist over time, ARDS would be a consideration. Electronically Signed   By: Jacqulynn Cadet M.D.   On: 03/10/2018 09:57   Portable Chest X-ray  Result Date: 03/10/2018 CLINICAL DATA:  Endotracheal and central line placements EXAM: PORTABLE CHEST 1 VIEW COMPARISON:  03/09/2018 FINDINGS: Endotracheal tube is been placed with tip measuring 3.4 cm above the carina. Right central venous catheter with tip over the mid SVC region. No pneumothorax. Shallow inspiration. Mild cardiac enlargement. Bilateral perihilar infiltrates could indicate edema, pneumonia, or ARDS. Increased density in the right apex could represent pleural fluid. This could be due to a layering effusion. In the setting of central venous catheter placement, hematoma is not excluded. IMPRESSION: Appliances appear in satisfactory position. Developing fluid in the right apex may represent layering pleural effusion or possibly hematoma. Persistent bilateral pulmonary infiltrates. Electronically Signed   By: Lucienne Capers M.D.   On: 03/10/2018 00:58   Dg Abd Portable 1v  Result Date: 03/10/2018 CLINICAL DATA:  OG tube placement EXAM: PORTABLE ABDOMEN - 1 VIEW COMPARISON:  None. FINDINGS: Enteric tube tip is in the left upper quadrant consistent with location in the body of the stomach. Gas-filled small and large bowel without significant distention. Diffuse infiltrates in the lungs. IMPRESSION: Enteric tube tip is in the left upper quadrant consistent with location in the body of the stomach. Electronically Signed   By: Lucienne Capers M.D.   On: 03/10/2018 00:58   Recent Results (from the past 240 hour(s))  Respiratory Panel by PCR     Status: None   Collection Time: 03/09/18  6:04 PM  Result Value Ref Range Status   Adenovirus NOT DETECTED NOT DETECTED Final   Coronavirus 229E NOT DETECTED NOT DETECTED Final   Coronavirus HKU1 NOT DETECTED NOT DETECTED Final   Coronavirus NL63 NOT DETECTED NOT DETECTED Final   Coronavirus OC43 NOT DETECTED NOT DETECTED Final   Metapneumovirus NOT DETECTED NOT DETECTED Final    Rhinovirus / Enterovirus NOT DETECTED NOT DETECTED Final   Influenza A NOT DETECTED NOT DETECTED Final   Influenza B NOT DETECTED NOT DETECTED Final   Parainfluenza Virus 1 NOT DETECTED NOT DETECTED Final   Parainfluenza Virus 2 NOT DETECTED NOT DETECTED Final   Parainfluenza Virus 3 NOT DETECTED NOT DETECTED Final   Parainfluenza Virus 4 NOT DETECTED NOT DETECTED Final   Respiratory Syncytial Virus NOT DETECTED NOT DETECTED Final   Bordetella pertussis NOT DETECTED NOT DETECTED Final   Chlamydophila pneumoniae NOT DETECTED NOT DETECTED Final   Mycoplasma pneumoniae NOT DETECTED NOT DETECTED Final    Comment: Performed at [facility], [address] [place], [place] [address]  MRSA PCR Screening     Status: None   Collection Time: 03/09/18  6:04 PM  Result Value Ref Range Status   MRSA by PCR NEGATIVE NEGATIVE Final    Comment:        The GeneXpert MRSA Assay (FDA approved for NASAL specimens only), is one component of a comprehensive MRSA colonization surveillance program. It is not intended to diagnose MRSA infection nor to guide or monitor treatment for MRSA infections. Performed at [facility], [address] [place], [place] [address]     Microbiology: Recent Results (from the past 240 hour(s))  Respiratory Panel by PCR     Status: None   Collection Time: 03/09/18  6:04 PM  Result Value Ref Range Status   Adenovirus NOT DETECTED NOT DETECTED Final   Coronavirus 229E NOT DETECTED NOT DETECTED Final   Coronavirus HKU1 NOT DETECTED NOT DETECTED Final   Coronavirus NL63 NOT DETECTED NOT DETECTED Final   Coronavirus OC43 NOT DETECTED NOT DETECTED Final   Metapneumovirus NOT DETECTED NOT DETECTED Final   Rhinovirus / Enterovirus NOT DETECTED NOT DETECTED Final   Influenza A NOT DETECTED NOT DETECTED Final   Influenza B NOT DETECTED NOT DETECTED Final   Parainfluenza Virus 1 NOT DETECTED NOT DETECTED Final   Parainfluenza Virus 2 NOT DETECTED NOT DETECTED  Final   Parainfluenza Virus 3 NOT DETECTED NOT DETECTED Final   Parainfluenza Virus 4 NOT DETECTED NOT DETECTED Final   Respiratory Syncytial Virus NOT DETECTED NOT DETECTED Final   Bordetella pertussis NOT DETECTED NOT DETECTED Final   Chlamydophila pneumoniae NOT DETECTED NOT DETECTED Final   Mycoplasma pneumoniae NOT DETECTED NOT DETECTED Final    Comment: Performed at [facility], [place] [address], [place], [place] [address]  MRSA PCR Screening     Status: None   Collection Time: 03/09/18  6:04 PM  Result Value Ref Range Status   MRSA by PCR NEGATIVE NEGATIVE Final    Comment:        The GeneXpert MRSA Assay (FDA approved for NASAL specimens only), is one component of a comprehensive MRSA colonization surveillance program. It is not intended to diagnose MRSA infection nor to guide or monitor treatment for MRSA infections. Performed at [facility], [place] [address], [place], [place] [address]     Radiographs and labs were personally reviewed by me.   Bobby Rumpf, MD [facility] [phone] 03/10/2018, 11:41 AM

## 2018-03-10 NOTE — Progress Notes (Addendum)
Progress Note  Patient Name: John Byrd Date of Encounter: 03/10/2018  Primary Cardiologist: Dr Jens Somrenshaw  Subjective   Pt intubated and sedated  Inpatient Medications    Scheduled Meds: . chlorhexidine  15 mL Mouth Rinse BID  . docusate  100 mg Per Tube BID  . enoxaparin (LOVENOX) injection  40 mg Subcutaneous Q24H  . famotidine  20 mg Per Tube BID  . insulin aspart  0-15 Units Subcutaneous Q4H  . mouth rinse  15 mL Mouth Rinse 10 times per day   Continuous Infusions: . sodium chloride 20 mL/hr at 03/10/18 0700  . cefTRIAXone (ROCEPHIN)  IV    . fentaNYL infusion INTRAVENOUS 175 mcg/hr (03/10/18 0700)  . furosemide (LASIX) infusion 8 mg/hr (03/10/18 0700)  . norepinephrine (LEVOPHED) Adult infusion 22 mcg/min (03/10/18 0700)  . propofol (DIPRIVAN) infusion 20 mcg/kg/min (03/10/18 0700)  . vancomycin Stopped (03/10/18 0553)   PRN Meds: sodium chloride, fentaNYL, midazolam, midazolam   Vital Signs    Vitals:   03/10/18 0615 03/10/18 0630 03/10/18 0645 03/10/18 0700  BP:    (!) 107/55  Pulse: 78 77 79 80  Resp: 20 (!) 21 19 18   Temp: 99.1 F (37.3 C) 99.3 F (37.4 C) 99.1 F (37.3 C) 99.1 F (37.3 C)  TempSrc:      SpO2: 96% 96% 94% 96%  Weight:      Height:        Intake/Output Summary (Last 24 hours) at 03/10/2018 0734 Last data filed at 03/10/2018 0700 Gross per 24 hour  Intake 1947.63 ml  Output 3840 ml  Net -1892.37 ml   Filed Weights   03/09/18 1110 03/09/18 1724 03/10/18 0500  Weight: 165 lb (74.8 kg) 167 lb 15.9 oz (76.2 kg) 153 lb 10.6 oz (69.7 kg)    Telemetry    Sinus with PAT- Personally Reviewed  Physical Exam   GEN: Intubated and sedated Neck: Positive JVD Cardiac: RRR, 3/6 DM Respiratory: Diffuse crackles GI: Soft, mildly distended MS: 1+ edema Neuro:  Not assessed as pt intubated and sedated Psych: Normal affect   Labs    Chemistry Recent Labs  Lab 03/09/18 1108 03/09/18 1120 03/09/18 1849 03/10/18 0456    NA  --  128* 130* 133*  K  --  3.3* 3.2* 3.9  CL  --  95* 96* 98*  CO2  --  20* 22 23  GLUCOSE  --  125* 107* 161*  BUN  --  12 11 13   CREATININE  --  0.81 0.71 0.84  CALCIUM  --  7.7* 7.2* 7.2*  PROT 7.8  --  7.2  --   ALBUMIN 1.9*  --  1.8*  --   AST 74*  --  84*  --   ALT 95*  --  94*  --   ALKPHOS 151*  --  147*  --   BILITOT 1.8*  --  1.7*  --   GFRNONAA  --  >60 >60 >60  GFRAA  --  >60 >60 >60  ANIONGAP  --  13 12 12      Hematology Recent Labs  Lab 03/09/18 1120 03/09/18 1849 03/10/18 0456  WBC 15.9* 16.7* 22.1*  RBC 3.58* 3.59* 3.39*  HGB 10.9* 10.5* 9.8*  HCT 32.1* 32.5* 30.9*  MCV 89.7 90.5 91.2  MCH 30.4 29.2 28.9  MCHC 34.0 32.3 31.7  RDW 15.3 15.5 15.5  PLT 359 334 358    Recent Labs  Lab 03/09/18 1126  TROPIPOC 0.02  BNP Recent Labs  Lab 03/09/18 1158  BNP 721.6*      Radiology    Dg Chest 2 View  Result Date: 03/09/2018 CLINICAL DATA:  SOB Pt is having SOB and CP x 2 days. Pt was on O2 at time of exam. EXAM: CHEST - 2 VIEW COMPARISON:  02/26/2018 FINDINGS: There are interstitial and airspace opacities throughout the entire right lung and within the left lung centered in the perihilar and lower lung, partly silhouetting the left heart border. The distribution of airspace opacities is less centrally predominant than it was on the prior study. No pleural effusion or pneumothorax. Heart is mildly enlarged. No convincing mediastinal or hilar masses. Hila are partly obscured by the contiguous lung opacities. Skeletal structures are intact. IMPRESSION: 1. Bilateral interstitial and airspace lung opacities, similar to the prior chest radiograph although less centrally predominant. Multifocal pneumonia suspected. Electronically Signed   By: Amie Portland M.D.   On: 03/09/2018 11:57   Portable Chest X-ray  Result Date: 03/10/2018 CLINICAL DATA:  Endotracheal and central line placements EXAM: PORTABLE CHEST 1 VIEW COMPARISON:  03/09/2018 FINDINGS:  Endotracheal tube is been placed with tip measuring 3.4 cm above the carina. Right central venous catheter with tip over the mid SVC region. No pneumothorax. Shallow inspiration. Mild cardiac enlargement. Bilateral perihilar infiltrates could indicate edema, pneumonia, or ARDS. Increased density in the right apex could represent pleural fluid. This could be due to a layering effusion. In the setting of central venous catheter placement, hematoma is not excluded. IMPRESSION: Appliances appear in satisfactory position. Developing fluid in the right apex may represent layering pleural effusion or possibly hematoma. Persistent bilateral pulmonary infiltrates. Electronically Signed   By: Burman Nieves M.D.   On: 03/10/2018 00:58   Dg Abd Portable 1v  Result Date: 03/10/2018 CLINICAL DATA:  OG tube placement EXAM: PORTABLE ABDOMEN - 1 VIEW COMPARISON:  None. FINDINGS: Enteric tube tip is in the left upper quadrant consistent with location in the body of the stomach. Gas-filled small and large bowel without significant distention. Diffuse infiltrates in the lungs. IMPRESSION: Enteric tube tip is in the left upper quadrant consistent with location in the body of the stomach. Electronically Signed   By: Burman Nieves M.D.   On: 03/10/2018 00:58    Patient Profile     57 y.o. male admitted with CHF; echo shows normal LV function, aortic valve vegetation and severe AI.  Assessment & Plan    1 subacute bacterial endocarditis-Echocardiogram shows normal LV function and mild left ventricular enlargement.  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  Continue vancomycin and Rocephin; blood cultures may remain negative as he received antibiotics in ER prior to blood cultures being drawn.  Pt now intubated. Continue lasix gtt for pulmonary edema (I/O [phone]); follow renal function. Nipride DCed due to hypotension.  Continue norepinephrine (pt became hypotensive following sedation/intubation). Dr Dorris Fetch following; may need to proceed with AVR today. 2 elevated liver functions-felt to be secondary to passive congestion; diurese as tolerated. 3 hyponatremia-Na improving. 4 VDRF-CCM managing 5 Acute diastolic CHF-due to AI. Diurese as tolerated.   For questions or updates, please contact [facility] Please consult [email or web address] for contact info under Cardiology/STEMI.      Signed, Olga Millers, MD  03/10/2018, 7:34 AM

## 2018-03-10 NOTE — Progress Notes (Signed)
Chaplain was paged to Pt.'s room for Advanced Directive education.  The Pt. Does not have persons of blood relation in this region of the country.  The Chaplain along with the attending doctor and nurse heard the patient in sound mind, clearly identify  John Byrd as a person in whom he is confident to make medical decisions on his behalf.  John Byrd and John Byrd have been in a relationship for 3 years, they do not live together.   John Byrd was present in 2H on Saturday night and will be present throughout the night into Sunday morning. John Byrd with the assistance of the vicarious interpreter service received a careful briefing on the pt.'s status from the attending physician.    Antonia's information  John Byrd [address]  [place], [place] [address]   Mobile phone - [phone]  Place of Work [employer] - [phone]  Chaplain will brief the Chaplain that rounds on 2H and the Spiritual Care Department on what is happening.  The hard copy of the Advanced Directive is secure with Spiritual Care. The final steps to complete the Advanced Directive with witnesses and Notary will be completed before as soon as possible on Monday morning.   Chaplain is grateful for the care, and professionalism of the Pt.'s attending clinicians.

## 2018-03-10 NOTE — Progress Notes (Signed)
[facility] notified need for potassium replacement. PM labs K 3.4, pt diuresing on Lasix drip.  Awaiting orders.

## 2018-03-10 NOTE — Progress Notes (Signed)
Note: AM labs drawn after hanging 3rd of 4 runs KCl.

## 2018-03-11 ENCOUNTER — Inpatient Hospital Stay (HOSPITAL_COMMUNITY): Payer: Self-pay

## 2018-03-11 ENCOUNTER — Inpatient Hospital Stay (HOSPITAL_COMMUNITY): Payer: Self-pay | Admitting: Certified Registered"

## 2018-03-11 ENCOUNTER — Encounter (HOSPITAL_COMMUNITY)
Admission: EM | Disposition: A | Discharge status: Home Health | Payer: Self-pay | Source: Home / Self Care | Attending: Thoracic Surgery (Cardiothoracic Vascular Surgery)

## 2018-03-11 DIAGNOSIS — I358 Other nonrheumatic aortic valve disorders: Secondary | ICD-10-CM

## 2018-03-11 DIAGNOSIS — Z952 Presence of prosthetic heart valve: Secondary | ICD-10-CM

## 2018-03-11 DIAGNOSIS — Z978 Presence of other specified devices: Secondary | ICD-10-CM

## 2018-03-11 DIAGNOSIS — J9601 Acute respiratory failure with hypoxia: Secondary | ICD-10-CM

## 2018-03-11 HISTORY — PX: VIDEO BRONCHOSCOPY: SHX5072

## 2018-03-11 HISTORY — DX: Other nonrheumatic aortic valve disorders: I35.8

## 2018-03-11 HISTORY — PX: AORTIC VALVE REPLACEMENT: SHX41

## 2018-03-11 LAB — POCT I-STAT, CHEM 8
BUN: 11 mg/dL (ref 6–20)
BUN: 10 mg/dL (ref 6–20)
BUN: 9 mg/dL (ref 6–20)
BUN: 11 mg/dL (ref 6–20)
BUN: 10 mg/dL (ref 6–20)
BUN: 11 mg/dL (ref 6–20)
CALCIUM ION: 1.02 mmol/L — AB (ref 1.15–1.40)
CALCIUM ION: 0.94 mmol/L — AB (ref 1.15–1.40)
CALCIUM ION: 1.07 mmol/L — AB (ref 1.15–1.40)
CHLORIDE: 92 mmol/L — AB (ref 101–111)
CHLORIDE: 91 mmol/L — AB (ref 101–111)
CHLORIDE: 93 mmol/L — AB (ref 101–111)
CREATININE: 0.6 mg/dL — AB (ref 0.61–1.24)
CREATININE: 0.7 mg/dL (ref 0.61–1.24)
CREATININE: 0.6 mg/dL — AB (ref 0.61–1.24)
CREATININE: 0.7 mg/dL (ref 0.61–1.24)
Calcium, Ion: 1.04 mmol/L — ABNORMAL LOW (ref 1.15–1.40)
Calcium, Ion: 0.9 mmol/L — ABNORMAL LOW (ref 1.15–1.40)
Calcium, Ion: 1.15 mmol/L (ref 1.15–1.40)
Chloride: 95 mmol/L — ABNORMAL LOW (ref 101–111)
Chloride: 96 mmol/L — ABNORMAL LOW (ref 101–111)
Chloride: 99 mmol/L — ABNORMAL LOW (ref 101–111)
Creatinine, Ser: 0.7 mg/dL (ref 0.61–1.24)
Creatinine, Ser: 0.7 mg/dL (ref 0.61–1.24)
GLUCOSE: 128 mg/dL — AB (ref 65–99)
GLUCOSE: 129 mg/dL — AB (ref 65–99)
GLUCOSE: 123 mg/dL — AB (ref 65–99)
Glucose, Bld: 121 mg/dL — ABNORMAL HIGH (ref 65–99)
Glucose, Bld: 130 mg/dL — ABNORMAL HIGH (ref 65–99)
Glucose, Bld: 158 mg/dL — ABNORMAL HIGH (ref 65–99)
HCT: 30 % — ABNORMAL LOW (ref 39.0–52.0)
HCT: 29 % — ABNORMAL LOW (ref 39.0–52.0)
HCT: 26 % — ABNORMAL LOW (ref 39.0–52.0)
HCT: 26 % — ABNORMAL LOW (ref 39.0–52.0)
HEMATOCRIT: 23 % — AB (ref 39.0–52.0)
HEMATOCRIT: 27 % — AB (ref 39.0–52.0)
HEMOGLOBIN: 8.8 g/dL — AB (ref 13.0–17.0)
Hemoglobin: 10.2 g/dL — ABNORMAL LOW (ref 13.0–17.0)
Hemoglobin: 7.8 g/dL — ABNORMAL LOW (ref 13.0–17.0)
Hemoglobin: 9.2 g/dL — ABNORMAL LOW (ref 13.0–17.0)
Hemoglobin: 9.9 g/dL — ABNORMAL LOW (ref 13.0–17.0)
Hemoglobin: 8.8 g/dL — ABNORMAL LOW (ref 13.0–17.0)
POTASSIUM: 3.2 mmol/L — AB (ref 3.5–5.1)
POTASSIUM: 2.8 mmol/L — AB (ref 3.5–5.1)
Potassium: 3 mmol/L — ABNORMAL LOW (ref 3.5–5.1)
Potassium: 4 mmol/L (ref 3.5–5.1)
Potassium: 3.2 mmol/L — ABNORMAL LOW (ref 3.5–5.1)
Potassium: 4.5 mmol/L (ref 3.5–5.1)
SODIUM: 136 mmol/L (ref 135–145)
Sodium: 137 mmol/L (ref 135–145)
Sodium: 138 mmol/L (ref 135–145)
Sodium: 135 mmol/L (ref 135–145)
Sodium: 137 mmol/L (ref 135–145)
Sodium: 136 mmol/L (ref 135–145)
TCO2: 29 mmol/L (ref 22–32)
TCO2: 30 mmol/L (ref 22–32)
TCO2: 31 mmol/L (ref 22–32)
TCO2: 30 mmol/L (ref 22–32)
TCO2: 26 mmol/L (ref 22–32)
TCO2: 27 mmol/L (ref 22–32)

## 2018-03-11 LAB — POCT I-STAT 3, ART BLOOD GAS (G3+)
ACID-BASE EXCESS: 8 mmol/L — AB (ref 0.0–2.0)
ACID-BASE EXCESS: 3 mmol/L — AB (ref 0.0–2.0)
Acid-Base Excess: 3 mmol/L — ABNORMAL HIGH (ref 0.0–2.0)
Acid-Base Excess: 1 mmol/L (ref 0.0–2.0)
BICARBONATE: 33.7 mmol/L — AB (ref 20.0–28.0)
BICARBONATE: 27.4 mmol/L (ref 20.0–28.0)
BICARBONATE: 29 mmol/L — AB (ref 20.0–28.0)
BICARBONATE: 27.4 mmol/L (ref 20.0–28.0)
O2 SAT: 100 %
O2 SAT: 97 %
O2 SAT: 94 %
O2 Saturation: 96 %
PCO2 ART: 49.8 mmHg — AB (ref 32.0–48.0)
PO2 ART: 326 mmHg — AB (ref 83.0–108.0)
PO2 ART: 77 mmHg — AB (ref 83.0–108.0)
Patient temperature: 37.4
TCO2: 35 mmol/L — ABNORMAL HIGH (ref 22–32)
TCO2: 29 mmol/L (ref 22–32)
TCO2: 30 mmol/L (ref 22–32)
TCO2: 29 mmol/L (ref 22–32)
pCO2 arterial: 54.8 mmHg — ABNORMAL HIGH (ref 32.0–48.0)
pCO2 arterial: 37.7 mmHg (ref 32.0–48.0)
pCO2 arterial: 52.3 mmHg — ABNORMAL HIGH (ref 32.0–48.0)
pH, Arterial: 7.396 (ref 7.350–7.450)
pH, Arterial: 7.469 — ABNORMAL HIGH (ref 7.350–7.450)
pH, Arterial: 7.371 (ref 7.350–7.450)
pH, Arterial: 7.329 — ABNORMAL LOW (ref 7.350–7.450)
pO2, Arterial: 81 mmHg — ABNORMAL LOW (ref 83.0–108.0)
pO2, Arterial: 86 mmHg (ref 83.0–108.0)

## 2018-03-11 LAB — CBC
HCT: 25.3 % — ABNORMAL LOW (ref 39.0–52.0)
HEMATOCRIT: 28.8 % — AB (ref 39.0–52.0)
HEMATOCRIT: 27.5 % — AB (ref 39.0–52.0)
HEMOGLOBIN: 8.7 g/dL — AB (ref 13.0–17.0)
Hemoglobin: 9.5 g/dL — ABNORMAL LOW (ref 13.0–17.0)
Hemoglobin: 7.7 g/dL — ABNORMAL LOW (ref 13.0–17.0)
MCH: 30.8 pg (ref 26.0–34.0)
MCH: 28.4 pg (ref 26.0–34.0)
MCH: 28.3 pg (ref 26.0–34.0)
MCHC: 33 g/dL (ref 30.0–36.0)
MCHC: 30.4 g/dL (ref 30.0–36.0)
MCHC: 31.6 g/dL (ref 30.0–36.0)
MCV: 93.5 fL (ref 78.0–100.0)
MCV: 93.4 fL (ref 78.0–100.0)
MCV: 89.6 fL (ref 78.0–100.0)
PLATELETS: 110 10*3/uL — AB (ref 150–400)
Platelets: 248 10*3/uL (ref 150–400)
Platelets: 143 10*3/uL — ABNORMAL LOW (ref 150–400)
RBC: 3.08 MIL/uL — ABNORMAL LOW (ref 4.22–5.81)
RBC: 2.71 MIL/uL — AB (ref 4.22–5.81)
RBC: 3.07 MIL/uL — ABNORMAL LOW (ref 4.22–5.81)
RDW: 15.9 % — AB (ref 11.5–15.5)
RDW: 15.7 % — AB (ref 11.5–15.5)
RDW: 17.8 % — ABNORMAL HIGH (ref 11.5–15.5)
WBC: 14.3 10*3/uL — AB (ref 4.0–10.5)
WBC: 15.8 10*3/uL — AB (ref 4.0–10.5)
WBC: 16.8 10*3/uL — ABNORMAL HIGH (ref 4.0–10.5)

## 2018-03-11 LAB — GLUCOSE, CAPILLARY
GLUCOSE-CAPILLARY: 131 mg/dL — AB (ref 65–99)
GLUCOSE-CAPILLARY: 156 mg/dL — AB (ref 65–99)
GLUCOSE-CAPILLARY: 117 mg/dL — AB (ref 65–99)
Glucose-Capillary: 122 mg/dL — ABNORMAL HIGH (ref 65–99)
Glucose-Capillary: 136 mg/dL — ABNORMAL HIGH (ref 65–99)
Glucose-Capillary: 130 mg/dL — ABNORMAL HIGH (ref 65–99)
Glucose-Capillary: 96 mg/dL (ref 65–99)
Glucose-Capillary: 106 mg/dL — ABNORMAL HIGH (ref 65–99)
Glucose-Capillary: 112 mg/dL — ABNORMAL HIGH (ref 65–99)
Glucose-Capillary: 106 mg/dL — ABNORMAL HIGH (ref 65–99)

## 2018-03-11 LAB — HEPATIC FUNCTION PANEL
ALBUMIN: 1.6 g/dL — AB (ref 3.5–5.0)
ALK PHOS: 129 U/L — AB (ref 38–126)
ALT: 79 U/L — AB (ref 17–63)
AST: 44 U/L — AB (ref 15–41)
BILIRUBIN INDIRECT: 0.7 mg/dL (ref 0.3–0.9)
Bilirubin, Direct: 0.8 mg/dL — ABNORMAL HIGH (ref 0.1–0.5)
Total Bilirubin: 1.5 mg/dL — ABNORMAL HIGH (ref 0.3–1.2)
Total Protein: 6.8 g/dL (ref 6.5–8.1)

## 2018-03-11 LAB — BASIC METABOLIC PANEL
ANION GAP: 10 (ref 5–15)
BUN: 13 mg/dL (ref 6–20)
CALCIUM: 7.3 mg/dL — AB (ref 8.9–10.3)
CO2: 27 mmol/L (ref 22–32)
Chloride: 96 mmol/L — ABNORMAL LOW (ref 101–111)
Creatinine, Ser: 0.9 mg/dL (ref 0.61–1.24)
GFR calc non Af Amer: >60 mL/min (ref >60)
GLUCOSE: 125 mg/dL — AB (ref 65–99)
POTASSIUM: 3.7 mmol/L (ref 3.5–5.1)
Sodium: 133 mmol/L — ABNORMAL LOW (ref 135–145)

## 2018-03-11 LAB — MAGNESIUM
MAGNESIUM: 3.5 mg/dL — AB (ref 1.7–2.4)
Magnesium: 2.1 mg/dL (ref 1.7–2.4)

## 2018-03-11 LAB — PROCALCITONIN: Procalcitonin: 0.84 ng/mL

## 2018-03-11 LAB — HEMOGLOBIN AND HEMATOCRIT, BLOOD
HEMATOCRIT: 28 % — AB (ref 39.0–52.0)
Hemoglobin: 8.8 g/dL — ABNORMAL LOW (ref 13.0–17.0)

## 2018-03-11 LAB — POCT I-STAT 4, (NA,K, GLUC, HGB,HCT)
Glucose, Bld: 126 mg/dL — ABNORMAL HIGH (ref 65–99)
HEMATOCRIT: 25 % — AB (ref 39.0–52.0)
Hemoglobin: 8.5 g/dL — ABNORMAL LOW (ref 13.0–17.0)
Potassium: 3.3 mmol/L — ABNORMAL LOW (ref 3.5–5.1)
SODIUM: 139 mmol/L (ref 135–145)

## 2018-03-11 LAB — PROTIME-INR
INR: 1.78
PROTHROMBIN TIME: 20.5 s — AB (ref 11.4–15.2)

## 2018-03-11 LAB — CREATININE, SERUM
CREATININE: 0.8 mg/dL (ref 0.61–1.24)
GFR calc Af Amer: >60 mL/min (ref >60)
GFR calc non Af Amer: >60 mL/min (ref >60)

## 2018-03-11 LAB — PHOSPHORUS: PHOSPHORUS: 3.5 mg/dL (ref 2.5–4.6)

## 2018-03-11 LAB — PLATELET COUNT: PLATELETS: 198 10*3/uL (ref 150–400)

## 2018-03-11 LAB — PREPARE RBC (CROSSMATCH)

## 2018-03-11 LAB — APTT: APTT: 36 s (ref 24–36)

## 2018-03-11 SURGERY — REPLACEMENT, AORTIC VALVE, OPEN
Anesthesia: General | Site: Chest

## 2018-03-11 MED ORDER — ALBUMIN HUMAN 5 % IV SOLN
250.0000 mL | INTRAVENOUS | Status: DC | PRN
  Administered 2018-03-11 (×3): 250 mL via INTRAVENOUS
  Filled 2018-03-11: qty 250

## 2018-03-11 MED ORDER — BISACODYL 5 MG PO TBEC
10.0000 mg | DELAYED_RELEASE_TABLET | Freq: Every day | ORAL | Status: DC
  Administered 2018-03-16: 10 mg via ORAL
  Filled 2018-03-11: qty 2

## 2018-03-11 MED ORDER — FAMOTIDINE IN NACL 20-0.9 MG/50ML-% IV SOLN
20.0000 mg | Freq: Two times a day (BID) | INTRAVENOUS | Status: AC
  Administered 2018-03-11 (×2): 20 mg via INTRAVENOUS
  Filled 2018-03-11: qty 50

## 2018-03-11 MED ORDER — INSULIN REGULAR BOLUS VIA INFUSION
0.0000 [IU] | Freq: Three times a day (TID) | INTRAVENOUS | Status: DC
  Filled 2018-03-11: qty 10

## 2018-03-11 MED ORDER — ROCURONIUM BROMIDE 10 MG/ML (PF) SYRINGE
PREFILLED_SYRINGE | INTRAVENOUS | Status: DC | PRN
  Administered 2018-03-11 (×3): 50 mg via INTRAVENOUS

## 2018-03-11 MED ORDER — POTASSIUM CHLORIDE 10 MEQ/50ML IV SOLN
10.0000 meq | INTRAVENOUS | Status: AC
  Administered 2018-03-11 (×3): 10 meq via INTRAVENOUS
  Filled 2018-03-11: qty 50

## 2018-03-11 MED ORDER — TRAMADOL HCL 50 MG PO TABS
50.0000 mg | ORAL_TABLET | ORAL | Status: DC | PRN
  Administered 2018-03-15 – 2018-03-17 (×2): 100 mg via ORAL
  Filled 2018-03-11 (×2): qty 2

## 2018-03-11 MED ORDER — SODIUM CHLORIDE 0.9 % IV SOLN
INTRAVENOUS | Status: DC
  Administered 2018-03-12: 01:00:00 via INTRAVENOUS

## 2018-03-11 MED ORDER — SODIUM CHLORIDE 0.9 % IV SOLN
INTRAVENOUS | Status: DC
  Administered 2018-03-11: 2.9 [IU]/h via INTRAVENOUS
  Filled 2018-03-11: qty 1

## 2018-03-11 MED ORDER — ONDANSETRON HCL 4 MG/2ML IJ SOLN: 4.0000 mg | Freq: Four times a day (QID) | INTRAMUSCULAR | Status: DC | PRN

## 2018-03-11 MED ORDER — SODIUM CHLORIDE 0.45 % IV SOLN
INTRAVENOUS | Status: DC | PRN
  Administered 2018-03-11 – 2018-03-17 (×3): via INTRAVENOUS

## 2018-03-11 MED ORDER — LACTATED RINGERS IV SOLN
INTRAVENOUS | Status: DC | PRN
  Administered 2018-03-11 (×2): via INTRAVENOUS

## 2018-03-11 MED ORDER — NOREPINEPHRINE BITARTRATE 1 MG/ML IV SOLN
0.0000 ug/min | INTRAVENOUS | Status: DC
  Administered 2018-03-11: 34 ug/min via INTRAVENOUS
  Administered 2018-03-12: 7 ug/min via INTRAVENOUS
  Filled 2018-03-11 (×2): qty 16

## 2018-03-11 MED ORDER — ACETAMINOPHEN 500 MG PO TABS
1000.0000 mg | ORAL_TABLET | Freq: Four times a day (QID) | ORAL | Status: AC
  Administered 2018-03-15 – 2018-03-16 (×5): 1000 mg via ORAL
  Filled 2018-03-11 (×6): qty 2

## 2018-03-11 MED ORDER — DOBUTAMINE IN D5W 4-5 MG/ML-% IV SOLN
2.5000 ug/kg/min | INTRAVENOUS | Status: DC
Start: 2018-03-11 — End: 2018-03-14
  Administered 2018-03-12: 5 ug/kg/min via INTRAVENOUS
  Filled 2018-03-11: qty 250

## 2018-03-11 MED ORDER — LACTATED RINGERS IV SOLN: INTRAVENOUS | Status: DC

## 2018-03-11 MED ORDER — DOCUSATE SODIUM 100 MG PO CAPS
200.0000 mg | ORAL_CAPSULE | Freq: Every day | ORAL | Status: DC
  Administered 2018-03-12 – 2018-03-16 (×3): 200 mg via ORAL
  Filled 2018-03-11 (×4): qty 2

## 2018-03-11 MED ORDER — ASPIRIN EC 325 MG PO TBEC
325.0000 mg | DELAYED_RELEASE_TABLET | Freq: Every day | ORAL | Status: DC
  Administered 2018-03-12 – 2018-03-19 (×5): 325 mg via ORAL
  Filled 2018-03-11 (×6): qty 1

## 2018-03-11 MED ORDER — MORPHINE SULFATE (PF) 2 MG/ML IV SOLN
2.0000 mg | INTRAVENOUS | Status: DC | PRN
  Administered 2018-03-12: 4 mg via INTRAVENOUS
  Administered 2018-03-12: 2 mg via INTRAVENOUS
  Administered 2018-03-12: 4 mg via INTRAVENOUS
  Administered 2018-03-12 – 2018-03-15 (×7): 2 mg via INTRAVENOUS
  Filled 2018-03-11 (×5): qty 1
  Filled 2018-03-11: qty 2
  Filled 2018-03-11 (×4): qty 1

## 2018-03-11 MED ORDER — ALBUMIN HUMAN 5 % IV SOLN
INTRAVENOUS | Status: DC | PRN
  Administered 2018-03-11 (×2): via INTRAVENOUS

## 2018-03-11 MED ORDER — NITROGLYCERIN IN D5W 200-5 MCG/ML-% IV SOLN: 0.0000 ug/min | INTRAVENOUS | Status: DC

## 2018-03-11 MED ORDER — SODIUM CHLORIDE 0.9 % IJ SOLN
OROMUCOSAL | Status: DC | PRN
  Administered 2018-03-11 (×3): 4 mL via TOPICAL

## 2018-03-11 MED ORDER — FENTANYL CITRATE (PF) 250 MCG/5ML IJ SOLN
INTRAMUSCULAR | Status: AC
  Filled 2018-03-11: qty 20

## 2018-03-11 MED ORDER — CHLORHEXIDINE GLUCONATE 0.12% ORAL RINSE (MEDLINE KIT)
15.0000 mL | Freq: Two times a day (BID) | OROMUCOSAL | Status: DC
  Administered 2018-03-11 – 2018-03-15 (×8): 15 mL via OROMUCOSAL

## 2018-03-11 MED ORDER — MAGNESIUM SULFATE 4 GM/100ML IV SOLN
4.0000 g | Freq: Once | INTRAVENOUS | Status: AC
  Administered 2018-03-11: 4 g via INTRAVENOUS
  Filled 2018-03-11: qty 100

## 2018-03-11 MED ORDER — LACTATED RINGERS IV SOLN: 500.0000 mL | Freq: Once | INTRAVENOUS | Status: DC | PRN

## 2018-03-11 MED ORDER — ARTIFICIAL TEARS OPHTHALMIC OINT
TOPICAL_OINTMENT | OPHTHALMIC | Status: DC | PRN
  Administered 2018-03-11: 1 via OPHTHALMIC

## 2018-03-11 MED ORDER — PROTAMINE SULFATE 10 MG/ML IV SOLN
INTRAVENOUS | Status: DC | PRN
  Administered 2018-03-11 (×5): 20 mg via INTRAVENOUS
  Administered 2018-03-11: 10 mg via INTRAVENOUS
  Administered 2018-03-11 (×2): 20 mg via INTRAVENOUS
  Administered 2018-03-11: 30 mg via INTRAVENOUS
  Administered 2018-03-11: 20 mg via INTRAVENOUS

## 2018-03-11 MED ORDER — SODIUM CHLORIDE 0.9% FLUSH
3.0000 mL | Freq: Two times a day (BID) | INTRAVENOUS | Status: DC
  Administered 2018-03-12 (×2): 3 mL via INTRAVENOUS
  Administered 2018-03-13: via INTRAVENOUS
  Administered 2018-03-14: 3 mL via INTRAVENOUS
  Administered 2018-03-14: 23:00:00 via INTRAVENOUS
  Administered 2018-03-15 – 2018-03-16 (×3): 3 mL via INTRAVENOUS

## 2018-03-11 MED ORDER — HEMOSTATIC AGENTS (NO CHARGE) OPTIME
TOPICAL | Status: DC | PRN
  Administered 2018-03-11: 1 via TOPICAL

## 2018-03-11 MED ORDER — DEXMEDETOMIDINE HCL IN NACL 200 MCG/50ML IV SOLN
INTRAVENOUS | Status: DC | PRN
  Administered 2018-03-11: .3 ug/kg/h via INTRAVENOUS

## 2018-03-11 MED ORDER — DEXMEDETOMIDINE HCL IN NACL 200 MCG/50ML IV SOLN
0.4000 ug/kg/h | INTRAVENOUS | Status: DC
  Administered 2018-03-11 – 2018-03-12 (×7): 0.7 ug/kg/h via INTRAVENOUS
  Administered 2018-03-12: 0.2 ug/kg/h via INTRAVENOUS
  Administered 2018-03-12: 0.7 ug/kg/h via INTRAVENOUS
  Administered 2018-03-13: 0.5 ug/kg/h via INTRAVENOUS
  Administered 2018-03-13 – 2018-03-14 (×10): 0.7 ug/kg/h via INTRAVENOUS
  Administered 2018-03-15: 1.2 ug/kg/h via INTRAVENOUS
  Administered 2018-03-15: 0.7 ug/kg/h via INTRAVENOUS
  Filled 2018-03-11 (×4): qty 50
  Filled 2018-03-11: qty 100
  Filled 2018-03-11 (×4): qty 50
  Filled 2018-03-11: qty 100
  Filled 2018-03-11 (×6): qty 50
  Filled 2018-03-11: qty 100
  Filled 2018-03-11: qty 50

## 2018-03-11 MED ORDER — VASOPRESSIN 20 UNIT/ML IV SOLN
0.0300 [IU]/min | INTRAVENOUS | Status: DC
  Administered 2018-03-11: 0.04 [IU]/min via INTRAVENOUS
  Filled 2018-03-11: qty 2

## 2018-03-11 MED ORDER — PROTAMINE SULFATE 10 MG/ML IV SOLN
INTRAVENOUS | Status: AC
  Filled 2018-03-11: qty 25

## 2018-03-11 MED ORDER — VANCOMYCIN HCL 500 MG IV SOLR
500.0000 mg | INTRAVENOUS | Status: AC
  Administered 2018-03-11: 500 mg via INTRAVENOUS
  Filled 2018-03-11: qty 500

## 2018-03-11 MED ORDER — VASOPRESSIN 20 UNIT/ML IV SOLN
0.0100 [IU]/min | INTRAVENOUS | Status: DC
  Administered 2018-03-12: 0.04 [IU]/min via INTRAVENOUS
  Filled 2018-03-11: qty 2

## 2018-03-11 MED ORDER — PANTOPRAZOLE SODIUM 40 MG PO TBEC: 40.0000 mg | DELAYED_RELEASE_TABLET | Freq: Every day | ORAL | Status: DC

## 2018-03-11 MED ORDER — SODIUM CHLORIDE 0.9% FLUSH
3.0000 mL | INTRAVENOUS | Status: DC | PRN
  Administered 2018-03-12: 10 mL via INTRAVENOUS
  Filled 2018-03-11: qty 3

## 2018-03-11 MED ORDER — ASPIRIN 81 MG PO CHEW
324.0000 mg | CHEWABLE_TABLET | Freq: Every day | ORAL | Status: DC
  Administered 2018-03-13 – 2018-03-15 (×3): 324 mg
  Filled 2018-03-11 (×3): qty 4

## 2018-03-11 MED ORDER — MIDAZOLAM HCL 5 MG/5ML IJ SOLN
INTRAMUSCULAR | Status: DC | PRN
  Administered 2018-03-11 (×3): 2 mg via INTRAVENOUS

## 2018-03-11 MED ORDER — MORPHINE SULFATE (PF) 2 MG/ML IV SOLN
1.0000 mg | INTRAVENOUS | Status: AC | PRN
  Administered 2018-03-11: 2 mg via INTRAVENOUS
  Filled 2018-03-11: qty 2

## 2018-03-11 MED ORDER — SODIUM CHLORIDE 0.9 % IV SOLN: 250.0000 mL | INTRAVENOUS | Status: DC

## 2018-03-11 MED ORDER — MIDAZOLAM HCL 10 MG/2ML IJ SOLN
INTRAMUSCULAR | Status: AC
  Filled 2018-03-11: qty 2

## 2018-03-11 MED ORDER — HEPARIN SODIUM (PORCINE) 1000 UNIT/ML IJ SOLN
INTRAMUSCULAR | Status: AC
  Filled 2018-03-11: qty 1

## 2018-03-11 MED ORDER — BISACODYL 10 MG RE SUPP: 10.0000 mg | Freq: Every day | RECTAL | Status: DC

## 2018-03-11 MED ORDER — ACETAMINOPHEN 160 MG/5ML PO SOLN
650.0000 mg | Freq: Once | ORAL | Status: DC
  Filled 2018-03-11: qty 20.3

## 2018-03-11 MED ORDER — LACTATED RINGERS IV SOLN
INTRAVENOUS | Status: DC | PRN
  Administered 2018-03-11 (×2): via INTRAVENOUS

## 2018-03-11 MED ORDER — 0.9 % SODIUM CHLORIDE (POUR BTL) OPTIME
TOPICAL | Status: DC | PRN
  Administered 2018-03-11: 5000 mL

## 2018-03-11 MED ORDER — CEFAZOLIN SODIUM-DEXTROSE 2-4 GM/100ML-% IV SOLN
2.0000 g | Freq: Three times a day (TID) | INTRAVENOUS | Status: DC
  Administered 2018-03-11: 2 g via INTRAVENOUS
  Filled 2018-03-11 (×3): qty 100

## 2018-03-11 MED ORDER — HEPARIN SODIUM (PORCINE) 1000 UNIT/ML IJ SOLN
INTRAMUSCULAR | Status: DC | PRN
  Administered 2018-03-11: 21000 [IU] via INTRAVENOUS

## 2018-03-11 MED ORDER — METOPROLOL TARTRATE 5 MG/5ML IV SOLN: 2.5000 mg | INTRAVENOUS | Status: DC | PRN

## 2018-03-11 MED ORDER — OXYCODONE HCL 5 MG PO TABS: 5.0000 mg | ORAL_TABLET | ORAL | Status: DC | PRN

## 2018-03-11 MED ORDER — MIDAZOLAM HCL 2 MG/2ML IJ SOLN
2.0000 mg | INTRAMUSCULAR | Status: DC | PRN
  Administered 2018-03-11 – 2018-03-15 (×17): 2 mg via INTRAVENOUS
  Filled 2018-03-11 (×17): qty 2

## 2018-03-11 MED ORDER — PHENYLEPHRINE 40 MCG/ML (10ML) SYRINGE FOR IV PUSH (FOR BLOOD PRESSURE SUPPORT)
PREFILLED_SYRINGE | INTRAVENOUS | Status: AC
  Filled 2018-03-11: qty 10

## 2018-03-11 MED ORDER — LACTATED RINGERS IV SOLN
INTRAVENOUS | Status: DC | PRN
  Administered 2018-03-11: 08:00:00 via INTRAVENOUS

## 2018-03-11 MED ORDER — NOREPINEPHRINE BITARTRATE 1 MG/ML IV SOLN
0.0000 ug/min | INTRAVENOUS | Status: DC
  Administered 2018-03-11: 26 ug/min via INTRAVENOUS
  Filled 2018-03-11: qty 4

## 2018-03-11 MED ORDER — SODIUM CHLORIDE 0.9 % IV SOLN
0.0000 ug/min | INTRAVENOUS | Status: DC
  Filled 2018-03-11: qty 2

## 2018-03-11 MED ORDER — CHLORHEXIDINE GLUCONATE 0.12 % MT SOLN
15.0000 mL | OROMUCOSAL | Status: AC
  Administered 2018-03-11: 15 mL via OROMUCOSAL

## 2018-03-11 MED ORDER — SODIUM CHLORIDE 0.9 % IV SOLN
INTRAVENOUS | Status: DC | PRN
  Administered 2018-03-11: 1.4 [IU]/h via INTRAVENOUS

## 2018-03-11 MED ORDER — ACETAMINOPHEN 160 MG/5ML PO SOLN
1000.0000 mg | Freq: Four times a day (QID) | ORAL | Status: AC
  Administered 2018-03-11 – 2018-03-15 (×14): 1000 mg
  Filled 2018-03-11 (×13): qty 40.6

## 2018-03-11 MED ORDER — METOPROLOL TARTRATE 25 MG/10 ML ORAL SUSPENSION
12.5000 mg | Freq: Two times a day (BID) | ORAL | Status: DC
  Administered 2018-03-11 – 2018-03-14 (×2): 12.5 mg
  Filled 2018-03-11 (×3): qty 5

## 2018-03-11 MED ORDER — ROCURONIUM BROMIDE 10 MG/ML (PF) SYRINGE
PREFILLED_SYRINGE | INTRAVENOUS | Status: AC
Start: 2018-03-11 — End: 2018-03-11
  Filled 2018-03-11: qty 10

## 2018-03-11 MED ORDER — VANCOMYCIN HCL IN DEXTROSE 1-5 GM/200ML-% IV SOLN: 1000.0000 mg | Freq: Once | INTRAVENOUS | Status: DC

## 2018-03-11 MED ORDER — FENTANYL CITRATE (PF) 100 MCG/2ML IJ SOLN
INTRAMUSCULAR | Status: DC | PRN
  Administered 2018-03-11 (×4): 100 ug via INTRAVENOUS
  Administered 2018-03-11: 50 ug via INTRAVENOUS
  Administered 2018-03-11 (×2): 150 ug via INTRAVENOUS

## 2018-03-11 MED ORDER — ORAL CARE MOUTH RINSE
15.0000 mL | OROMUCOSAL | Status: DC
  Administered 2018-03-11 – 2018-03-15 (×37): 15 mL via OROMUCOSAL

## 2018-03-11 MED ORDER — CEFUROXIME SODIUM 750 MG IJ SOLR
INTRAMUSCULAR | Status: DC | PRN
  Administered 2018-03-11: 750 mg via INTRAVENOUS

## 2018-03-11 MED ORDER — PROPOFOL 10 MG/ML IV BOLUS
INTRAVENOUS | Status: AC
  Filled 2018-03-11: qty 20

## 2018-03-11 MED ORDER — METOPROLOL TARTRATE 12.5 MG HALF TABLET
12.5000 mg | ORAL_TABLET | Freq: Two times a day (BID) | ORAL | Status: DC
  Administered 2018-03-16 – 2018-03-19 (×7): 12.5 mg via ORAL
  Filled 2018-03-11 (×7): qty 1

## 2018-03-11 MED ORDER — SODIUM CHLORIDE 0.9 % IV SOLN
Freq: Once | INTRAVENOUS | Status: AC
  Administered 2018-03-11: 15:00:00 via INTRAVENOUS

## 2018-03-11 MED ORDER — SODIUM CHLORIDE 0.9 % IV SOLN: Freq: Once | INTRAVENOUS | Status: DC

## 2018-03-11 MED ORDER — ARTIFICIAL TEARS OPHTHALMIC OINT
TOPICAL_OINTMENT | OPHTHALMIC | Status: AC
  Filled 2018-03-11: qty 3.5

## 2018-03-11 MED ORDER — ACETAMINOPHEN 650 MG RE SUPP
650.0000 mg | Freq: Once | RECTAL | Status: DC
  Filled 2018-03-11: qty 1

## 2018-03-11 MED ORDER — VASOPRESSIN 20 UNIT/ML IV SOLN
INTRAVENOUS | Status: DC | PRN
  Administered 2018-03-11: .05 [IU]/min via INTRAVENOUS

## 2018-03-11 SURGICAL SUPPLY — 94 items
ADAPTER CARDIO PERF ANTE/RETRO (ADAPTER) ×4 IMPLANT
ADAPTER MULTI PERFUSION 15 (ADAPTER) ×2 IMPLANT
APPLICATOR COTTON TIP 6 STRL (MISCELLANEOUS) IMPLANT
APPLICATOR COTTON TIP 6IN STRL (MISCELLANEOUS) ×4 IMPLANT
BAG DECANTER FOR FLEXI CONT (MISCELLANEOUS) ×2 IMPLANT
BLADE STERNUM SYSTEM 6 (BLADE) ×4 IMPLANT
BLADE SURG 11 STRL SS (BLADE) ×2 IMPLANT
BLADE SURG 15 STRL LF DISP TIS (BLADE) ×2 IMPLANT
BLADE SURG 15 STRL SS (BLADE) ×2
BLOOD HAEMOCONCENTR 700 MIDI (MISCELLANEOUS) ×2 IMPLANT
CANISTER SUCT 3000ML PPV (MISCELLANEOUS) ×4 IMPLANT
CANNULA AORTIC ROOT 9FR (CANNULA) ×2 IMPLANT
CANNULA EZ GLIDE AORTIC 21FR (CANNULA) ×4 IMPLANT
CANNULA GUNDRY RCSP 15FR (MISCELLANEOUS) ×4 IMPLANT
CANNULA MC2 2 STG 36/46 NON-V (CANNULA) IMPLANT
CANNULA VENOUS 2 STG 34/46 (CANNULA) ×2
CATH CPB KIT HENDRICKSON (MISCELLANEOUS) ×4 IMPLANT
CATH HEART VENT LEFT (CATHETERS) ×2 IMPLANT
CATH ROBINSON RED A/P 18FR (CATHETERS) ×8 IMPLANT
CATH THORACIC 36FR (CATHETERS) ×2 IMPLANT
CATH THORACIC 36FR RT ANG (CATHETERS) ×6 IMPLANT
CATH/SQUID NICHOLS JEHLE COR (CATHETERS) ×2 IMPLANT
CLIP FOGARTY SPRING 6M (CLIP) IMPLANT
CONT SPEC 4OZ CLIKSEAL STRL BL (MISCELLANEOUS) ×4 IMPLANT
CRADLE DONUT ADULT HEAD (MISCELLANEOUS) ×4 IMPLANT
DRAPE SLUSH/WARMER DISC (DRAPES) ×4 IMPLANT
DRSG COVADERM 4X10 (GAUZE/BANDAGES/DRESSINGS) ×2 IMPLANT
DRSG COVADERM 4X14 (GAUZE/BANDAGES/DRESSINGS) ×4 IMPLANT
ELECT REM PT RETURN 9FT ADLT (ELECTROSURGICAL) ×8
ELECTRODE REM PT RTRN 9FT ADLT (ELECTROSURGICAL) ×4 IMPLANT
FELT TEFLON 1X6 (MISCELLANEOUS) ×6 IMPLANT
GAUZE SPONGE 4X4 12PLY STRL (GAUZE/BANDAGES/DRESSINGS) ×6 IMPLANT
GLOVE BIO SURGEON STRL SZ 6.5 (GLOVE) ×3 IMPLANT
GLOVE BIO SURGEONS STRL SZ 6.5 (GLOVE) ×3
GLOVE BIOGEL M 6.5 STRL (GLOVE) ×6 IMPLANT
GLOVE BIOGEL M STER SZ 6 (GLOVE) ×6 IMPLANT
GLOVE BIOGEL PI IND STRL 6 (GLOVE) IMPLANT
GLOVE BIOGEL PI IND STRL 6.5 (GLOVE) IMPLANT
GLOVE BIOGEL PI INDICATOR 6 (GLOVE) ×2
GLOVE BIOGEL PI INDICATOR 6.5 (GLOVE) ×8
GLOVE SURG SIGNA 7.5 PF LTX (GLOVE) ×12 IMPLANT
GOWN STRL REUS W/ TWL LRG LVL3 (GOWN DISPOSABLE) ×8 IMPLANT
GOWN STRL REUS W/ TWL XL LVL3 (GOWN DISPOSABLE) ×2 IMPLANT
GOWN STRL REUS W/TWL LRG LVL3 (GOWN DISPOSABLE) ×12
GOWN STRL REUS W/TWL XL LVL3 (GOWN DISPOSABLE) ×2
HEMOSTAT POWDER SURGIFOAM 1G (HEMOSTASIS) ×12 IMPLANT
HEMOSTAT SURGICEL 2X14 (HEMOSTASIS) ×4 IMPLANT
INSERT FOGARTY XLG (MISCELLANEOUS) IMPLANT
IV CATH 18G X1.75 CATHLON (IV SOLUTION) ×2 IMPLANT
KIT BASIN OR (CUSTOM PROCEDURE TRAY) ×4 IMPLANT
KIT SUCTION CATH 14FR (SUCTIONS) ×8 IMPLANT
KIT TURNOVER KIT B (KITS) ×4 IMPLANT
LINE VENT (MISCELLANEOUS) ×2 IMPLANT
NDL SUT 1 .5 CRC FRENCH EYE (NEEDLE) IMPLANT
NEEDLE FRENCH EYE (NEEDLE) ×2
NS IRRIG 1000ML POUR BTL (IV SOLUTION) ×24 IMPLANT
PACK E OPEN HEART (SUTURE) ×4 IMPLANT
PACK OPEN HEART (CUSTOM PROCEDURE TRAY) ×4 IMPLANT
PAD ARMBOARD 7.5X6 YLW CONV (MISCELLANEOUS) ×8 IMPLANT
SET CARDIOPLEGIA MPS 5001102 (MISCELLANEOUS) ×2 IMPLANT
SUT BONE WAX W31G (SUTURE) ×4 IMPLANT
SUT ETHIBON 2 0 V 52N 30 (SUTURE) ×8 IMPLANT
SUT ETHIBON EXCEL 2-0 V-5 (SUTURE) IMPLANT
SUT ETHIBOND 2 0 SH (SUTURE) ×2
SUT ETHIBOND 2 0 SH 36X2 (SUTURE) ×2 IMPLANT
SUT ETHIBOND 2 0 V4 (SUTURE) IMPLANT
SUT ETHIBOND 2 0V4 GREEN (SUTURE) IMPLANT
SUT ETHIBOND 4 0 RB 1 (SUTURE) IMPLANT
SUT ETHIBOND V-5 VALVE (SUTURE) IMPLANT
SUT PROLENE 3 0 SH 1 (SUTURE) IMPLANT
SUT PROLENE 3 0 SH DA (SUTURE) ×4 IMPLANT
SUT PROLENE 4 0 RB 1 (SUTURE) ×16
SUT PROLENE 4-0 RB1 .5 CRCL 36 (SUTURE) ×4 IMPLANT
SUT PROLENE 5 0 C 1 36 (SUTURE) ×2 IMPLANT
SUT SILK  1 MH (SUTURE)
SUT SILK 1 MH (SUTURE) ×2 IMPLANT
SUT STEEL 6MS V (SUTURE) ×2 IMPLANT
SUT STEEL SZ 6 DBL 3X14 BALL (SUTURE) ×2 IMPLANT
SUT VIC AB 1 CTX 36 (SUTURE) ×4
SUT VIC AB 1 CTX36XBRD ANBCTR (SUTURE) ×4 IMPLANT
SUT VIC AB 2-0 CTX 27 (SUTURE) IMPLANT
SUT VIC AB 3-0 X1 27 (SUTURE) IMPLANT
SYR 10ML KIT SKIN ADHESIVE (MISCELLANEOUS) IMPLANT
SYSTEM SAHARA CHEST DRAIN ATS (WOUND CARE) ×4 IMPLANT
TAPE CLOTH SURG 4X10 WHT LF (GAUZE/BANDAGES/DRESSINGS) ×2 IMPLANT
TAPE PAPER 2X10 WHT MICROPORE (GAUZE/BANDAGES/DRESSINGS) ×2 IMPLANT
TOWEL GREEN STERILE (TOWEL DISPOSABLE) ×4 IMPLANT
TOWEL GREEN STERILE FF (TOWEL DISPOSABLE) ×4 IMPLANT
TRAY FOLEY SILVER 16FR TEMP (SET/KITS/TRAYS/PACK) ×4 IMPLANT
TUBE SUCT INTRACARD DLP 20F (MISCELLANEOUS) ×2 IMPLANT
UNDERPAD 30X30 (UNDERPADS AND DIAPERS) ×4 IMPLANT
VALVE MAGNA EASE 21MM (Prosthesis & Implant Heart) ×2 IMPLANT
VENT LEFT HEART 12002 (CATHETERS) ×4
WATER STERILE IRR 1000ML POUR (IV SOLUTION) ×8 IMPLANT

## 2018-03-11 NOTE — Progress Notes (Addendum)
TEE has been performed   Shona SimpsonLane, Lannis Lichtenwalner F 03/11/2018, 8:53 AM

## 2018-03-11 NOTE — Op Note (Signed)
NAMEEDGEL, DEGNAN NO.:  [id number]  MEDICAL RECORD NO.:  [id number]  LOCATION:                                 FACILITY:  PHYSICIAN:  Salvatore Decent. Dorris Fetch, M.D. 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  PROCEDURES:   Median sternotomy, extracorporeal circulation. Aortic valve replacement with 21 mm [facility] Ease bovine pericardial valve (model #3300TFX, serial [id number]).   Bronchoscopy.  SURGEON:  Salvatore Decent. Dorris Fetch, M.D.  ASSISTANT:  Jari Favre, PA  ANESTHESIA:  General.  FINDINGS:  TEE showed severe aortic insufficiency, dilated left ventricle, flail non-coronary cusp of the aortic valve.  No vegetations seen on other valves.  Large mobile vegetation on aortic valve.  INTRAOPERATIVE FINDINGS:  Cardiomegaly, large vegetation with complete disruption of non-coronary cusp.  Left and right cusps intact.  Small vegetation on the ventricular side of left cusp.  Post replacement TEE showed good function of the prosthetic valve with no perivalvular leaks and preserved left ventricular wall motion.  CLINICAL NOTE:  [name] is a 57 year old Hispanic male who presented with a 3-week history of shortness of breath.  He was in acute respiratory failure due to pulmonary edema with near whiteout of both lungs.  He required intubation.  Prior to being intubated, I had discussed surgical repair of his aortic valve for severe aortic insufficiency due to endocarditis.  We discussed tissue versus mechanical valves.  His preference was for a tissue valve.  This was done via an interpreter.  He understood the indications, risks, and benefits and agreed to proceed, but was intubated prior to signing the consent form.   He was diuresed aggressively to try to improve the pulmonary edema prior to valve replacement.  At this point, it was thought the additional diuresis is not possible without replacing the valve.  The decision was made to proceed with surgery.  OPERATIVE NOTE:  [name] was brought from the intensive care unit directly to the operating room.  He was already intubated and on a ventilator.  Dr. Sharee Holster of the Anesthesia Service placed an arterial blood pressure monitoring line and a Swan-Ganz catheter.  PA pressures were markedly elevated approximately 75/45.  Cardiac index was preserved.  General anesthesia was induced.  The chest, abdomen, and legs were prepped and draped in usual sterile fashion.  He was already receiving intravenous antibiotics, additional vancomycin was given to get him up to the preoperative prophylaxis dose.  Transesophageal echocardiography showed a dilated left ventricle.  There was severe AI. There was a flail non-coronary leaflet.  There were no vegetations on the other leaflets.  There was a large mobile vegetation on the aortic valve.  Median sternotomy was performed.  Hemostasis was achieved.  The patient was heparinized.  The pericardium was opened.  There was minimal pericardial effusion.  There was marked cardiomegaly.  The aorta was of normal caliber with no evidence of atherosclerotic disease.  The aorta was cannulated via concentric 2-0 Ethibond pledgeted pursestring sutures.  A  dual stage venous cannula was placed via pursestring suture in the right atrial appendage.  After confirming adequate anticoagulation with ACT measurement, cardiopulmonary bypass was initiated.  Flows were maintained per protocol.  The patient was cooled to 32 degrees Celsius.  A left ventricular vent was placed via pursestring suture in the right superior pulmonary vein.  A retrograde cardioplegia cannula was placed via pursestring suture in the right atrium and  directed into the coronary sinus.  An antegrade cardioplegia cannula was placed in the ascending aorta.  The aorta was crossclamped.  The left ventricle was emptied via the vent.  Cardiac arrest was achieved with cold retrograde blood cardioplegia and topical iced saline.  There was a rapid diastolic arrest and septal cooling to 10 degrees Celsius with 1 L of cardioplegia. Additional cardioplegia was administered at roughly 15-minute intervals during the cross-clamp portion of the procedure. An aortotomy was made in the ascending aorta.  The valve was inspected. The right and left cusps were intact.  There was a small vegetation on the ventricular side of the left cusp.  The non-coronary cusp was essentially totally destroyed.  There was a large mobile vegetation. Part of this was sent for cultures.  The remainder of the valve was sent for pathology.  There was no annular calcification.  Coronary ostia were inspected and were well clear of the anulus.  The anulus sized for a 21- mm [employer] Ease bovine pericardial valve.  The valve was prepared per manufacturer's recommendations. 2-0 Ethibond horizontal mattress sutures with subannular pledgets were placed in the annulus, 15 sutures were utilized in all.  The sutures then were placed through the sewing ring of the valve.  The valve was lowered into place and the sutures were sequentially tied.  The valve was well seated.  The anulus was probed with the fine tip right-angle.  No gaps were noted.  The coronary ostia were inspected, and there was no impingement.  Rewarming was begun.  The aortotomy was closed in 2 layers with 4-0 Prolene suture.  The first layer was a running horizontal mattress suture followed by a running simple suture.  At the completion of the first layer before tying the suture, the patient was placed in Trendelenburg position, and de-airing maneuvers were performed.  It should be noted that carbon dioxide  was insufflated into the operative field during the procedure.  The second layer of the closure then was performed.  A warm dose of retrograde cardioplegia was administered while de-airing through the aortic root vent.  Then with the patient in steep Trendelenburg position.  Lidocaine was administered.  Additional de-airing was performed, and the aortic crossclamp was removed.  The total crossclamp time was 60 minutes.  The patient initially was in sinus rhythm then fibrillated.  A single defibrillation with 10 joules was required.  An additional 50 mg of lidocaine was administered.  There were no further ventricular arrhythmias.  The patient did transiently have heart block.  Epicardial pacing wires were placed on the right ventricle and right atrium.  The patient did resume sinus rhythm prior to separation from bypass.  The retrograde cardioplegia cannula was removed after several minutes with an established rhythm.  The left ventricular vent was removed. Additional de-airing maneuvers were performed as this was done.  The patient had come to the operating room on dopamine and norepinephrine infusions. A vasopressin infusion was initiated as well.  When the patient had rewarmed to a core temperature of 37 degrees Celsius, a  final de-airing was done using a IV catheter through the apex of the heart.  The patient then was weaned from bypass without difficulty on the first attempt.  The total bypass time was 92 minutes. Transesophageal echocardiography showed preserved left ventricular function.  The left ventricle was much less dilated.  The pulmonary artery pressures were normalized.  There was good function of the prosthetic valve with no perivalvular leaks.  The stent was removed from the aortic root.  A test dose of protamine was well-tolerated, and the remainder of the protamine was administered without incident.  The atrial and aortic cannulae were removed.  There was good  hemostasis.  The chest was copiously irrigated with warm saline.  The pericardium was closed with interrupted 3-0 silk sutures over the aorta and base of the heart.  Bilateral chest tubes were placed.  These were both placed through the mediastinum with the tip within each pleural space respectively.  The sternum was closed with a combination of single and double heavy gauge stainless steel wires. The patient tolerated the sternal closure well without any hemodynamic compromise.  The pectoralis fascia, subcutaneous tissue, and skin were closed in standard fashion.  All sponge, needle, and instrument counts were correct at the end of the procedure.  The patient was taken from the operating room to the Surgical Intensive Care Unit intubated and in critical but stable condition.     Salvatore Decent Dorris Fetch, M.D.     SCH/MEDQ  D:  03/11/2018  T:  03/11/2018  Job:  [id number]

## 2018-03-11 NOTE — Progress Notes (Signed)
Pt transported from OR on vent.

## 2018-03-11 NOTE — Progress Notes (Signed)
eLink Physician-Brief Progress Note Patient Name: John Byrd DOB: [birth year] MRN: [id number]   Date of Service  03/11/2018  HPI/Events of Note  Ca++ = 7.3 and Albumin - 1.6. Ca++ corrects to 9.2 given Albumin = 1.6.   eICU Interventions  Will not replace Ca++ at this time.      Intervention Category Major Interventions: Electrolyte abnormality - evaluation and management  Sommer,Steven Eugene 03/11/2018, 5:24 AM

## 2018-03-11 NOTE — Brief Op Note (Addendum)
03/09/2018 - 03/11/2018  12:00 PM  PATIENT:  John Byrd  57 y.o. male  PRE-OPERATIVE DIAGNOSIS:  Severe AI secondary to endocarditis  POST-OPERATIVE DIAGNOSIS:  Severe AI secondary to endocarditis  PROCEDURE:  Procedure(s): AORTIC VALVE REPLACEMENT (AVR) USING 21 MM MAGNA EASE PERICARDIAL BIOPROSTHESIS-AORTIC, MODEL 3300TFX, SERIAL # [id number] (N/A) VIDEO BRONCHOSCOPY (N/A)  SURGEON:  Surgeon(s) and Role:    * Loreli SlotHendrickson, Maizee Reinhold C, MD - Primary  PHYSICIAN ASSISTANT:  Jari Favreessa Conte, PA-C   ANESTHESIA:   general  EBL:  860 mL   BLOOD ADMINISTERED:none  DRAINS: ROUTINE   LOCAL MEDICATIONS USED:  NONE  SPECIMEN:  Source of Specimen:  AORTIC VALVE LEAFLETS  DISPOSITION OF SPECIMEN:  PATHOLOGY  COUNTS:  YES  DICTATION: .Dragon Dictation  PLAN OF CARE: Admit to inpatient   PATIENT DISPOSITION:  ICU - intubated and hemodynamically stable.   Delay start of Pharmacological VTE agent (>24hrs) due to surgical blood loss or risk of bleeding: yes  XC= 60 min CPB= 92 min Severe AI due to flail noncoronary cusp destroyed by large vegetation, small vegetation on left coronary leaflet

## 2018-03-11 NOTE — Anesthesia Postprocedure Evaluation (Signed)
Anesthesia Post Note  Patient: Melton KrebsValentino Santiago Douthit  Procedure(s) Performed: AORTIC VALVE REPLACEMENT (AVR) USING 21 MM MAGNA EASE PERICARDIAL BIOPROSTHESIS-AORTIC, MODEL 3300TFX, SERIAL # [id number] (N/A Chest) VIDEO BRONCHOSCOPY (N/A )     Patient location during evaluation: SICU Anesthesia Type: General Level of consciousness: sedated and patient remains intubated per anesthesia plan Pain management: pain level controlled Vital Signs Assessment: post-procedure vital signs reviewed and stable Respiratory status: patient remains intubated per anesthesia plan Cardiovascular status: stable Postop Assessment: no apparent nausea or vomiting Anesthetic complications: no    Last Vitals:  Vitals:   03/11/18 1146 03/11/18 1203  BP: 104/72 104/72  Pulse: 99 75  Resp: 20   Temp:    SpO2: 100%     Last Pain:  Vitals:   03/11/18 0400  TempSrc: Esophageal  PainSc: Asleep                 Darnette Lampron,JAMES TERRILL

## 2018-03-11 NOTE — Plan of Care (Signed)
Patient stable at this time, titrating vasoactive drips as charted.  Infusion of 1 unit of PRBCs completed.  Monitoring as per protocol.

## 2018-03-11 NOTE — Progress Notes (Signed)
Subjective: Intubated, sedated   Antibiotics:  Anti-infectives (From admission, onward)   Start     Dose/Rate Route Frequency Ordered Stop   03/11/18 1830  vancomycin (VANCOCIN) IVPB 1000 mg/200 mL premix  Status:  Discontinued     1,000 mg 200 mL/hr over 60 Minutes Intravenous  Once 03/11/18 1141 03/11/18 1145   03/11/18 1430  ceFAZolin (ANCEF) IVPB 2g/100 mL premix     2 g 200 mL/hr over 30 Minutes Intravenous Every 8 hours 03/11/18 1141 03/13/18 1429   03/11/18 0815  vancomycin (VANCOCIN) 500 mg in sodium chloride 0.9 % 100 mL IVPB     500 mg 100 mL/hr over 60 Minutes Intravenous To Surgery 03/11/18 0806 03/11/18 0900   03/11/18 0400  vancomycin (VANCOCIN) 1,250 mg in sodium chloride 0.9 % 250 mL IVPB  Status:  Discontinued     1,250 mg 166.7 mL/hr over 90 Minutes Intravenous To Surgery 03/10/18 1919 03/11/18 1140   03/11/18 0400  cefUROXime (ZINACEF) 1.5 g in sodium chloride 0.9 % 100 mL IVPB     1.5 g 200 mL/hr over 30 Minutes Intravenous To Surgery 03/10/18 1919 03/11/18 0820   03/11/18 0400  cefUROXime (ZINACEF) 750 mg in sodium chloride 0.9 % 100 mL IVPB  Status:  Discontinued     750 mg 200 mL/hr over 30 Minutes Intravenous To Surgery 03/10/18 1917 03/11/18 1140   03/10/18 1800  cefTRIAXone (ROCEPHIN) 2 g in sodium chloride 0.9 % 100 mL IVPB     2 g 200 mL/hr over 30 Minutes Intravenous Every 24 hours 03/09/18 1756     03/10/18 0400  vancomycin (VANCOCIN) IVPB 750 mg/150 ml premix     750 mg 150 mL/hr over 60 Minutes Intravenous Every 8 hours 03/09/18 1719     03/09/18 1800  cefTRIAXone (ROCEPHIN) 1 g in sodium chloride 0.9 % 100 mL IVPB     1 g 200 mL/hr over 30 Minutes Intravenous  Once 03/09/18 1756 03/09/18 1925   03/09/18 1730  vancomycin (VANCOCIN) 1,500 mg in sodium chloride 0.9 % 500 mL IVPB     1,500 mg 250 mL/hr over 120 Minutes Intravenous  Once 03/09/18 1717 03/09/18 2303   03/09/18 1330  cefTRIAXone (ROCEPHIN) 1 g in sodium chloride 0.9 % 100  mL IVPB     1 g 200 mL/hr over 30 Minutes Intravenous  Once 03/09/18 1318 03/09/18 1508   03/09/18 1330  azithromycin (ZITHROMAX) 500 mg in sodium chloride 0.9 % 250 mL IVPB     500 mg 250 mL/hr over 60 Minutes Intravenous  Once 03/09/18 1318 03/09/18 1634      Medications: Scheduled Meds: . [START ON 03/12/2018] acetaminophen  1,000 mg Oral Q6H   Or  . [START ON 03/12/2018] acetaminophen (TYLENOL) oral liquid 160 mg/5 mL  1,000 mg Per Tube Q6H  . acetaminophen (TYLENOL) oral liquid 160 mg/5 mL  650 mg Per Tube Once   Or  . acetaminophen  650 mg Rectal Once  . [START ON 03/12/2018] aspirin EC  325 mg Oral Daily   Or  . [START ON 03/12/2018] aspirin  324 mg Per Tube Daily  . [START ON 03/12/2018] bisacodyl  10 mg Oral Daily   Or  . [START ON 03/12/2018] bisacodyl  10 mg Rectal Daily  . chlorhexidine  15 mL Mouth/Throat NOW  . [START ON 03/12/2018] docusate sodium  200 mg Oral Daily  . insulin regular  0-10 Units Intravenous TID WC  . metoprolol tartrate  12.5 mg Oral BID   Or  . metoprolol tartrate  12.5 mg Per Tube BID  . [START ON 03/13/2018] pantoprazole  40 mg Oral Daily  . [START ON 03/12/2018] sodium chloride flush  3 mL Intravenous Q12H   Continuous Infusions: . sodium chloride 20 mL/hr at 03/11/18 1400  . [START ON 03/12/2018] sodium chloride    . sodium chloride 10 mL/hr at 03/11/18 1400  . sodium chloride    . albumin human    .  ceFAZolin (ANCEF) IV    . cefTRIAXone (ROCEPHIN)  IV Stopped (03/10/18 1735)  . dexmedetomidine (PRECEDEX) IV infusion 0.7 mcg/kg/hr (03/11/18 1400)  . DOBUTamine 5.017 mcg/kg/min (03/11/18 1400)  . famotidine (PEPCID) IV Stopped (03/11/18 1227)  . insulin (NOVOLIN-R) infusion 2.1 Units/hr (03/11/18 1400)  . lactated ringers    . lactated ringers    . lactated ringers 20 mL/hr at 03/11/18 1400  . magnesium sulfate 4 g (03/11/18 1212)  . nitroGLYCERIN Stopped (03/11/18 1228)  . norepinephrine (LEVOPHED) Adult infusion 30 mcg/min (03/11/18 1345)  .  phenylephrine (NEO-SYNEPHRINE) Adult infusion Stopped (03/11/18 1231)  . potassium chloride Stopped (03/11/18 1355)  . vancomycin 750 mg (03/11/18 1243)   PRN Meds:.sodium chloride, albumin human, lactated ringers, metoprolol tartrate, midazolam, morphine injection, morphine injection, ondansetron (ZOFRAN) IV, oxyCODONE, [START ON 03/12/2018] sodium chloride flush, traMADol    Objective: Weight change: -12 lb 10.6 oz (-5.743 kg)  Intake/Output Summary (Last 24 hours) at 03/11/2018 1416 Last data filed at 03/11/2018 1407 Gross per 24 hour  Intake 5445.93 ml  Output 5700 ml  Net -254.07 ml   Blood pressure 104/72, pulse 75, temperature 97.9 F (36.6 C), resp. rate 20, height 5\' 3"  (1.6 m), weight 152 lb 5.4 oz (69.1 kg), SpO2 99 %. Temp:  [97.3 F (36.3 C)-99.7 F (37.6 C)] 97.9 F (36.6 C) (04/08 1200) Pulse Rate:  [75-115] 75 (04/08 1203) Resp:  [12-30] 20 (04/08 1200) BP: (95-121)/(39-72) 104/72 (04/08 1203) SpO2:  [89 %-100 %] 99 % (04/08 1200) Arterial Line BP: (99-140)/(36-76) 99/60 (04/08 1200) FiO2 (%):  [70 %-80 %] 70 % (04/08 1203) Weight:  [152 lb 5.4 oz (69.1 kg)] 152 lb 5.4 oz (69.1 kg) (04/08 0500)  Physical Exam: General:intubated sedated HEENT: anicteric sclera, EOMI CVS regular rate, normal r,  Chest: chest tubes in place Abdomen: soft  nondistended,  Extremities: no  clubbing or edema noted bilaterally Skin: no rashes Neuro: nonfocal  CBC:  CBC Latest Ref Rng & Units 03/11/2018 03/11/2018 03/11/2018  WBC 4.0 - 10.5 K/uL 15.8(H) - -  Hemoglobin 13.0 - 17.0 g/dL 7.7(L) 8.5(L) 8.8(L)  Hematocrit 39.0 - 52.0 % 25.3(L) 25.0(L) 26.0(L)  Platelets 150 - 400 K/uL 110(L) - -     BMET Recent Labs    03/10/18 1700 03/11/18 0304  03/11/18 0950 03/11/18 1045 03/11/18 1157  NA 134* 133*   < > 135 137 139  K 3.4* 3.7   < > 4.0 3.2* 3.3*  CL 100* 96*   < > 95* 96*  --   CO2 26 27  --   --   --   --   GLUCOSE 111* 125*   < > 158* 129* 126*  BUN 14 13   < > 11 10   --   CREATININE 0.83 0.90   < > 0.70 0.60*  --   CALCIUM 7.4* 7.3*  --   --   --   --    < > = values in this interval not  displayed.     Liver Panel  Recent Labs    03/09/18 1849 03/11/18 0304  PROT 7.2 6.8  ALBUMIN 1.8* 1.6*  AST 84* 44*  ALT 94* 79*  ALKPHOS 147* 129*  BILITOT 1.7* 1.5*  BILIDIR 0.8* 0.8*  IBILI 0.9 0.7       Sedimentation Rate No results for input(s): ESRSEDRATE in the last 72 hours. C-Reactive Protein No results for input(s): CRP in the last 72 hours.  Micro Results: Recent Results (from the past 720 hour(s))  Respiratory Panel by PCR     Status: None   Collection Time: 03/09/18  6:04 PM  Result Value Ref Range Status   Adenovirus NOT DETECTED NOT DETECTED Final   Coronavirus 229E NOT DETECTED NOT DETECTED Final   Coronavirus HKU1 NOT DETECTED NOT DETECTED Final   Coronavirus NL63 NOT DETECTED NOT DETECTED Final   Coronavirus OC43 NOT DETECTED NOT DETECTED Final   Metapneumovirus NOT DETECTED NOT DETECTED Final   Rhinovirus / Enterovirus NOT DETECTED NOT DETECTED Final   Influenza A NOT DETECTED NOT DETECTED Final   Influenza B NOT DETECTED NOT DETECTED Final   Parainfluenza Virus 1 NOT DETECTED NOT DETECTED Final   Parainfluenza Virus 2 NOT DETECTED NOT DETECTED Final   Parainfluenza Virus 3 NOT DETECTED NOT DETECTED Final   Parainfluenza Virus 4 NOT DETECTED NOT DETECTED Final   Respiratory Syncytial Virus NOT DETECTED NOT DETECTED Final   Bordetella pertussis NOT DETECTED NOT DETECTED Final   Chlamydophila pneumoniae NOT DETECTED NOT DETECTED Final   Mycoplasma pneumoniae NOT DETECTED NOT DETECTED Final    Comment: Performed at [facility], [address] [place], [place] [address]  MRSA PCR Screening     Status: None   Collection Time: 03/09/18  6:04 PM  Result Value Ref Range Status   MRSA by PCR NEGATIVE NEGATIVE Final    Comment:        The GeneXpert MRSA Assay (FDA approved for NASAL specimens only), is one component  of a comprehensive MRSA colonization surveillance program. It is not intended to diagnose MRSA infection nor to guide or monitor treatment for MRSA infections. Performed at [facility], [address] [place], [place] [address]   Culture, blood (routine x 2)     Status: None (Preliminary result)   Collection Time: 03/09/18  6:41 PM  Result Value Ref Range Status   Specimen Description BLOOD LEFT A-LINE  Final   Special Requests   Final    BOTTLES DRAWN AEROBIC AND ANAEROBIC Blood Culture adequate volume   Culture   Final    NO GROWTH < 24 HOURS Performed at [facility], [address], [place], [place] [address]    Report Status PENDING  Incomplete  Culture, blood (routine x 2)     Status: None (Preliminary result)   Collection Time: 03/09/18  6:42 PM  Result Value Ref Range Status   Specimen Description BLOOD LEFT ANTECUBITAL  Final   Special Requests   Final    BOTTLES DRAWN AEROBIC AND ANAEROBIC Blood Culture adequate volume   Culture   Final    NO GROWTH < 24 HOURS Performed at [facility], [address] [place], [place] [address]    Report Status PENDING  Incomplete  Culture, blood (single)     Status: None (Preliminary result)   Collection Time: 03/09/18  6:52 PM  Result Value Ref Range Status   Specimen Description BLOOD LEFT HAND  Final  Special Requests   Final    BOTTLES DRAWN AEROBIC AND ANAEROBIC Blood Culture adequate volume   Culture   Final    NO GROWTH < 24 HOURS Performed at [facility], [address] [place], [place] [address]    Report Status PENDING  Incomplete  Surgical PCR screen     Status: None   Collection Time: 03/10/18  6:56 PM  Result Value Ref Range Status   MRSA, PCR NEGATIVE NEGATIVE Final   Staphylococcus aureus NEGATIVE NEGATIVE Final    Comment: (NOTE) The Xpert SA Assay (FDA approved for NASAL specimens in patients 16 years of age and older), is one component of a comprehensive surveillance program.  It is not intended to diagnose infection nor to guide or monitor treatment. Performed at [facility], [address] [place], [place] [address]     Studies/Results: Dg Chest Port 1 View  Result Date: 03/11/2018 CLINICAL DATA:  Hypoxia EXAM: PORTABLE CHEST 1 VIEW COMPARISON:  March 11, 2018 study obtained earlier in the day FINDINGS: Endotracheal tube tip is 1.6 cm above the carina. Central catheter tip is in superior vena cava. There are bilateral chest tubes. Nasogastric tube tip and side port are below the diaphragm. There are temporary pacemaker leads attached to the right heart. No pneumothorax. There is patchy interstitial and alveolar edema bilaterally with somewhat less consolidation in the left lower lobe compared to earlier in the day. No new opacity. Heart size and pulmonary vascularity are normal. No adenopathy. IMPRESSION: Tube and catheter positions as described without pneumothorax. Note that the endotracheal tube tip is close to the carina and may wish to be withdrawn 2-3 cm. Extensive interstitial and patchy alveolar opacity, likely edema, somewhat less on the left than on the study obtained earlier in the day. Changes on the right are similar. Electronically Signed   By: Bretta Bang III M.D.   On: 03/11/2018 12:12   Dg Chest Portable 1 View  Result Date: 03/11/2018 CLINICAL DATA:  Respiratory failure EXAM: PORTABLE CHEST 1 VIEW COMPARISON:  Yesterday FINDINGS: Endotracheal tube tip between the clavicular heads and carina. Right IJ catheter and orogastric tube in stable good position. Extensive airspace disease, asymmetric to the right. No visible pleural fluid or pneumothorax. Asymmetric density at the right base attributed to lung obscuring the right heart border. IMPRESSION: 1. Stable hardware positioning. 2. History of cardiogenic shock unchanged extensive airspace disease. Electronically Signed   By: Marnee Spring M.D.   On: 03/11/2018 07:30   Portable Chest  Xray  Result Date: 03/10/2018 CLINICAL DATA:  57 year old male currently intubated with respiratory failure EXAM: PORTABLE CHEST 1 VIEW COMPARISON:  Prior chest x-ray yesterday 03/09/2018 FINDINGS: The patient remains intubated. The tip of the endotracheal tube is 3.4 cm above the carina. A right IJ central venous catheter is in place with the tip overlying the mid SVC. A nasogastric tube is present with the tip below the diaphragm and off the field of view, likely within the stomach. Similar appearance of the lungs with extensive bilateral interstitial and airspace opacities. No large pleural effusion or pneumothorax identified. No significant interval change in the aeration of the lungs. No acute osseous abnormality. IMPRESSION: 1. Interval placement of a nasogastric tube. The tube lies off the field of view, presumably in the stomach. 2. Otherwise, stable and satisfactory support apparatus. 3. Unchanged appearance of the chest with diffuse bilateral interstitial and airspace opacities more confluent on the right than the left. Findings remain  concerning for either congestive heart failure with asymmetric pulmonary edema, or multifocal pneumonia. If the abnormalities persist over time, ARDS would be a consideration. Electronically Signed   By: Malachy MoanHeath  McCullough M.D.   On: 03/10/2018 09:57   Portable Chest X-ray  Result Date: 03/10/2018 CLINICAL DATA:  Endotracheal and central line placements EXAM: PORTABLE CHEST 1 VIEW COMPARISON:  03/09/2018 FINDINGS: Endotracheal tube is been placed with tip measuring 3.4 cm above the carina. Right central venous catheter with tip over the mid SVC region. No pneumothorax. Shallow inspiration. Mild cardiac enlargement. Bilateral perihilar infiltrates could indicate edema, pneumonia, or ARDS. Increased density in the right apex could represent pleural fluid. This could be due to a layering effusion. In the setting of central venous catheter placement, hematoma is not excluded.  IMPRESSION: Appliances appear in satisfactory position. Developing fluid in the right apex may represent layering pleural effusion or possibly hematoma. Persistent bilateral pulmonary infiltrates. Electronically Signed   By: Burman NievesWilliam  Stevens M.D.   On: 03/10/2018 00:58   Dg Abd Portable 1v  Result Date: 03/10/2018 CLINICAL DATA:  OG tube placement EXAM: PORTABLE ABDOMEN - 1 VIEW COMPARISON:  None. FINDINGS: Enteric tube tip is in the left upper quadrant consistent with location in the body of the stomach. Gas-filled small and large bowel without significant distention. Diffuse infiltrates in the lungs. IMPRESSION: Enteric tube tip is in the left upper quadrant consistent with location in the body of the stomach. Electronically Signed   By: Burman NievesWilliam  Stevens M.D.   On: 03/10/2018 00:58      Assessment/Plan:  INTERVAL HISTORY: found to have large AV vegetation  pt sp AVR  Principal Problem:   Aortic valve endocarditis Active Problems:   Acute respiratory failure with hypoxia (HCC)   Acute decompensated heart failure (HCC)   S/P AVR    John Byrd is a 57 y.o. 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 Dorris FetchHendrickson with AV vegetation sent to micro and path, sp AVR  #1 AV endocarditis: continue vancomycin, ceftriaxone  I have submitted paperwork to Pathology dept [facility] PCR broad ranging bacterial, fungal, AFB as well as for specifically Bartonella, Legionella, T. whippleii     LOS: 2 days   Acey LavCornelius Van Dam 03/11/2018, 2:16 PM

## 2018-03-11 NOTE — Progress Notes (Signed)
70cc of 62mcg/ml fentanyl wasted in sink with June Smith, RN.

## 2018-03-11 NOTE — Anesthesia Preprocedure Evaluation (Addendum)
Anesthesia Evaluation  Patient identified by MRN, date of birth, ID band Patient awake    Reviewed: Allergy & Precautions, NPO status , Patient's Chart, lab work & pertinent test results  Airway Mallampati: I  TM Distance: <3 FB Neck ROM: Full   Comment: intubated Dental no notable dental hx.    Pulmonary    + rhonchi  + decreased breath sounds      Cardiovascular +CHF   Rhythm:Irregular Rate:Tachycardia     Neuro/Psych    GI/Hepatic   Endo/Other    Renal/GU      Musculoskeletal   Abdominal   Peds  Hematology   Anesthesia Other Findings   Reproductive/Obstetrics                            Anesthesia Physical Anesthesia Plan  ASA: IV  Anesthesia Plan: General   Post-op Pain Management:    Induction: Intravenous  PONV Risk Score and Plan: 3 and Treatment may vary due to age or medical condition  Airway Management Planned: Oral ETT  Additional Equipment: TEE, Ultrasound Guidance Line Placement, Arterial line and PA Cath  Intra-op Plan:   Post-operative Plan: Post-operative intubation/ventilation  Informed Consent: I have reviewed the patients History and Physical, chart, labs and discussed the procedure including the risks, benefits and alternatives for the proposed anesthesia with the patient or authorized representative who has indicated his/her understanding and acceptance.   Dental advisory given  Plan Discussed with: CRNA  Anesthesia Plan Comments:        Anesthesia Quick Evaluation

## 2018-03-11 NOTE — Progress Notes (Addendum)
PULMONARY / CRITICAL CARE MEDICINE   Name: John Byrd MRN: [id number] DOB: Oct 02, 1961    ADMISSION DATE:  03/09/2018 CONSULTATION DATE:  4/6/419  REFERRING MD:  Enedina Finner  CHIEF COMPLAINT:  dyspnea  HISTORY OF PRESENT ILLNESS:   57 year old male nonsmoker with no known past medical history who presents 4/6 with 3 weeks of progressive dyspnea, lower extremity edema and nonproductive cough.  He had a chest x-ray performed 1 week prior to admit for this complaint which showed multifocal airspace opacities however he has not followed up for additional care for this.  He denies fevers, night sweats, weight loss, chest pain, nausea, vomiting, diarrhea, myalgias, sick contacts.  No sore throat.  Did report that he was having occasional chills a couple of months ago that improved with taking over-the-counter medicine.  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  He is from [place] though his girlfriend lives here.    In the emergency department he was noted to be acutely tachypneic, oxygen saturation in the 80s on room air which did not improve with a nonrebreather and so he was started on BiPAP and given a dose of Lasix, ceftriaxone and azithromycin.    4/8- patient found to have significant aortic regurgitation and large embolus. Did not improve with conservative treatment. Currently s/p aortic valve replacement    SUBJECTIVE:  Progressive respiratory failure overnight requiring intubation.  TTE showed severe AI with vegetation.  On Lasix drip-urine output trending down On levophed-weaning   VITAL SIGNS: BP 104/72   Pulse 75   Temp 99.7 F (37.6 C)   Resp 20   Ht 5\' 3"  (1.6 m)   Wt 152 lb 5.4 oz (69.1 kg)   SpO2 100%   BMI 26.99 kg/m   HEMODYNAMICS: PAP: (38-48)/(7-20) 48/7 CVP:  [9 mmHg-17 mmHg] 10 mmHg CO:  [4.4 L/min] 4.4 L/min  VENTILATOR SETTINGS: Vent Mode: PRVC FiO2 (%):  [70  %-80 %] 70 % Set Rate:  [20 bmp] 20 bmp Vt Set:  [460 mL] 460 mL PEEP:  [10 cmH20-12 cmH20] 10 cmH20 Plateau Pressure:  [18 cmH20-28 cmH20] 27 cmH20  INTAKE / OUTPUT: I/O last 3 completed shifts: In: 4453.5 [I.V.:2583.5; NG/GT:120; IV Piggyback:1750] Out: 6200 [Urine:6200]  PHYSICAL EXAMINATION: General:male lying in bed in NAD on vent sedated  Neuro: Sedated on vent, RASS -2 mm moist, ETT HEENT: ETT, + JVD Cardiovascular: S1-S2, + M Lungs: Respirations are even and nonlabored on vent, coarse, bibasilar crackles Abdomen: Round, soft, hypoactive bowel sounds Musculoskeletal: Warm and dry, 1-2+ BLE edema   LABS:  BMET Recent Labs  Lab 03/10/18 0456 03/10/18 1700 03/11/18 0304  03/11/18 0900 03/11/18 0950 03/11/18 1045 03/11/18 1157  NA 133* 134* 133*   < > 138 135 137 139  K 3.9 3.4* 3.7   < > 2.8* 4.0 3.2* 3.3*  CL 98* 100* 96*   < > 91* 95* 96*  --   CO2 23 26 27   --   --   --   --   --   BUN 13 14 13    < > 9 11 10   --   CREATININE 0.84 0.83 0.90   < > 0.60* 0.70 0.60*  --   GLUCOSE 161* 111* 125*   < > 121* 158* 129* 126*   < > = values in this interval not displayed.    Electrolytes Recent Labs  Lab 03/10/18 0456 03/10/18 1700 03/11/18 0304  CALCIUM  7.2* 7.4* 7.3*  MG 2.2 2.1 2.1  PHOS 4.2 3.9 3.5    CBC Recent Labs  Lab 03/10/18 0456 03/11/18 0303  03/11/18 0949  03/11/18 1045 03/11/18 1157 03/11/18 1208  WBC 22.1* 14.3*  --   --   --   --   --  15.8*  HGB 9.8* 9.5*   < > 8.8*   < > 8.8* 8.5* 7.7*  HCT 30.9* 28.8*   < > 28.0*   < > 26.0* 25.0* 25.3*  PLT 358 248  --  198  --   --   --  110*   < > = values in this interval not displayed.    Coag's Recent Labs  Lab 03/10/18 1854 03/11/18 1208  APTT 34 36  INR 1.28 1.78    Sepsis Markers Recent Labs  Lab 03/09/18 1849 03/10/18 0928 03/11/18 0304  LATICACIDVEN 1.1  --   --   PROCALCITON 0.29 1.01 0.84    ABG Recent Labs  Lab 03/11/18 0906 03/11/18 1049 03/11/18 1201  PHART  7.396 7.469* 7.371  PCO2ART 54.8* 37.7 49.8*  PO2ART 326.0* 81.0* 86.0    Liver Enzymes Recent Labs  Lab 03/09/18 1108 03/09/18 1849 03/11/18 0304  AST 74* 84* 44*  ALT 95* 94* 79*  ALKPHOS 151* 147* 129*  BILITOT 1.8* 1.7* 1.5*  ALBUMIN 1.9* 1.8* 1.6*    Cardiac Enzymes No results for input(s): TROPONINI, PROBNP in the last 168 hours.  Glucose Recent Labs  Lab 03/10/18 0846 03/10/18 1254 03/10/18 1650 03/10/18 2020 03/10/18 2323 03/11/18 0317  GLUCAP 134* 118* 104* 111* 111* 122*    Imaging Dg Chest Port 1 View  Result Date: 03/11/2018 CLINICAL DATA:  Hypoxia EXAM: PORTABLE CHEST 1 VIEW COMPARISON:  March 11, 2018 study obtained earlier in the day FINDINGS: Endotracheal tube tip is 1.6 cm above the carina. Central catheter tip is in superior vena cava. There are bilateral chest tubes. Nasogastric tube tip and side port are below the diaphragm. There are temporary pacemaker leads attached to the right heart. No pneumothorax. There is patchy interstitial and alveolar edema bilaterally with somewhat less consolidation in the left lower lobe compared to earlier in the day. No new opacity. Heart size and pulmonary vascularity are normal. No adenopathy. IMPRESSION: Tube and catheter positions as described without pneumothorax. Note that the endotracheal tube tip is close to the carina and may wish to be withdrawn 2-3 cm. Extensive interstitial and patchy alveolar opacity, likely edema, somewhat less on the left than on the study obtained earlier in the day. Changes on the right are similar. Electronically Signed   By: Bretta Bang III M.D.   On: 03/11/2018 12:12   Dg Chest Portable 1 View  Result Date: 03/11/2018 CLINICAL DATA:  Respiratory failure EXAM: PORTABLE CHEST 1 VIEW COMPARISON:  Yesterday FINDINGS: Endotracheal tube tip between the clavicular heads and carina. Right IJ catheter and orogastric tube in stable good position. Extensive airspace disease, asymmetric to the  right. No visible pleural fluid or pneumothorax. Asymmetric density at the right base attributed to lung obscuring the right heart border. IMPRESSION: 1. Stable hardware positioning. 2. History of cardiogenic shock unchanged extensive airspace disease. Electronically Signed   By: Marnee Spring M.D.   On: 03/11/2018 07:30     STUDIES:  2D echo 4/6>>> severe AI, aortic valve vegetation   CULTURES: BC x 2 4/6>>> BC x 2 4/7>>>  ANTIBIOTICS: vanc 4/6>>> Rocephin 4/6>>> azithro 4/6>>>   SIGNIFICANT EVENTS:  LINES/TUBES: ETT 4/6>>> R IJ CVL 4/6>>>   DISCUSSION: [age] year old male with no known history admitted 4/7 with acute respiratory failure, found to have acute diastolic CHF due to aortic insufficiency in the setting of subacute bacterial endocarditis.  ASSESSMENT / PLAN:  PULMONARY Ventilator dependent respiratory failure secondary to Acute hypoxemic respiratory failure secondary to severe aortic valve regurgitation  P:   Vent support - 8cc/kg  F/u CXR  F/u ABG Continue PEEP 12 Wean FiO2 as able   CARDIOVASCULAR Acute diastolic heart failure secondary to aortic valve regurgitation secondary to valve endocarditis s/p bioprosthetic valve replacement  Aortic insufficiency Subacute bacterial endocarditis-aortic valve Shock-cardiogenic P:  Continue pressors-wean as able POD #0  Cardiology and CTS following   RENAL Hyponatremia- resolved  P:   Follow-up chemistry Replace electrolytes as needed Strict I/O  GASTROINTESTINAL Elevated LFTs-likely secondary to hepatic congestion - resolved  P:   N.p.o. for now Famotidine PPX  HEMATOLOGIC Anemia P:  Follow-up CBC @ 6PM  INFECTIOUS Subacute bacterial endocarditis.  No known indwelling hardware or history of IV drug abuse. P:   Cultures pending-negative to date Follow-up procalcitonin Continue antibiotics   ENDOCRINE No active issue P:   Monitor glucose on chemistries  NEUROLOGIC Sedation needs on  vent P:   RASS goal: -1 Continue propofol, fentanyl drips Daily wake up assessment   FAMILY   - Inter-disciplinary family meet or Palliative Care meeting due by:  Day 7  .Dimas Aguasijo Lemonte Al D.O [facility] Pager: [phone]

## 2018-03-11 NOTE — Progress Notes (Signed)
Patient ID: John Byrd, male   DOB: Apr 22, 1961, 57 y.o.   MRN: [id number]  TCTS Evening Rounds:   Hemodynamically stable on dobut 5, vaso .04, levo 30 mcg. CI = 2.2  Sedated on vent, starting to wake up.  Urine output good  CT output low  CBC    Component Value Date/Time   WBC 15.8 (H) 03/11/2018 1208   RBC 2.71 (L) 03/11/2018 1208   HGB 7.7 (L) 03/11/2018 1208   HCT 25.3 (L) 03/11/2018 1208   PLT 110 (L) 03/11/2018 1208   MCV 93.4 03/11/2018 1208   MCH 28.4 03/11/2018 1208   MCHC 30.4 03/11/2018 1208   RDW 15.7 (H) 03/11/2018 1208   LYMPHSABS 1.2 10/25/2014 1219   MONOABS 0.3 10/25/2014 1219   EOSABS 0.0 10/25/2014 1219   BASOSABS 0.0 10/25/2014 1219   Transfused 1 unit PRBC's  BMET    Component Value Date/Time   NA 139 03/11/2018 1157   K 3.3 (L) 03/11/2018 1157   CL 96 (L) 03/11/2018 1045   CO2 27 03/11/2018 0304   GLUCOSE 126 (H) 03/11/2018 1157   BUN 10 03/11/2018 1045   CREATININE 0.60 (L) 03/11/2018 1045   CALCIUM 7.3 (L) 03/11/2018 0304   GFRNONAA >60 03/11/2018 0304   GFRAA >60 03/11/2018 0304     A/P:  Stable postop course. Wean pressors as tolerated. Will keep on vent overnight due to preop pulmonary edema.

## 2018-03-11 NOTE — Anesthesia Procedure Notes (Signed)
Central Venous Catheter Insertion Performed by: Rica Koyanagi, MD, anesthesiologist Start/End4/07/2018 7:45 AM, 03/11/2018 8:05 AM Patient location: OR. Preanesthetic checklist: patient identified, IV checked, site marked, risks and benefits discussed, surgical consent, monitors and equipment checked, pre-op evaluation and timeout performed Position: Trendelenburg Patient sedated Hand hygiene performed , maximum sterile barriers used  and Seldinger technique used Catheter size: 7.5 Fr PA cath was placed.MAC introducer Swan type:thermodilution PA Cath depth:50 Procedure performed without using ultrasound guided technique. Attempts: 1 Following insertion, line sutured, dressing applied and Biopatch. Post procedure assessment: blood return through all ports, free fluid flow and no air  Patient tolerated the procedure well with no immediate complications.

## 2018-03-11 NOTE — Plan of Care (Signed)
Patient remains stable, titrating gtts, monitoring

## 2018-03-11 NOTE — Interval H&P Note (Signed)
History and Physical Interval Note:  With 2 days of diuresis there has been minimal improvement in CXR, currently has sat of 95% on 80% and 10 PEEP. I do not think there is any benefit to additional delay with AVR. Likely will take a significant period of time to see any dramatic improvement in pulmonary status and even that is uncertain. In the meantime there is the possibility of developing other complications. Surgery is high risk, but further delay would be even higher risk. He was aware of the high risk nature of the procedure and consented verbally before being intubated and sedated.  Will proceed with tissue valve AVR 03/11/2018 7:12 AM  John Byrd  has presented today for surgery, with the diagnosis of AI  The various methods of treatment have been discussed with the patient and family. After consideration of risks, benefits and other options for treatment, the patient has consented to  Procedure(s): AORTIC VALVE REPLACEMENT (AVR) (N/A) as a surgical intervention .  The patient's history has been reviewed, patient examined, no change in status, stable for surgery.  I have reviewed the patient's chart and labs.  Questions were answered to the patient's satisfaction.     Loreli SlotSteven C Maryna Yeagle

## 2018-03-11 NOTE — Transfer of Care (Signed)
Immediate Anesthesia Transfer of Care Note  Patient: Melton KrebsValentino Santiago Brett  Procedure(s) Performed: AORTIC VALVE REPLACEMENT (AVR) USING 21 MM MAGNA EASE PERICARDIAL BIOPROSTHESIS-AORTIC, MODEL 3300TFX, SERIAL # [id number] (N/A Chest) VIDEO BRONCHOSCOPY (N/A )  Patient Location: ICU  Anesthesia Type:General  Level of Consciousness: Patient remains intubated per anesthesia plan  Airway & Oxygen Therapy: Patient remains intubated per anesthesia plan and Patient placed on Ventilator (see vital sign flow sheet for setting)  Post-op Assessment: Report given to RN and Post -op Vital signs reviewed and stable  Post vital signs: Reviewed and stable  Last Vitals:  Vitals Value Taken Time  BP    Temp 36.6 C 03/11/2018 11:50 AM  Pulse 94 03/11/2018 11:50 AM  Resp 20 03/11/2018 11:50 AM  SpO2 99 % 03/11/2018 11:50 AM  Vitals shown include unvalidated device data.  Last Pain:  Vitals:   03/11/18 0400  TempSrc: Esophageal  PainSc: Asleep         Complications: No apparent anesthesia complications

## 2018-03-12 ENCOUNTER — Inpatient Hospital Stay (HOSPITAL_COMMUNITY): Payer: Self-pay

## 2018-03-12 DIAGNOSIS — J810 Acute pulmonary edema: Secondary | ICD-10-CM

## 2018-03-12 DIAGNOSIS — Z952 Presence of prosthetic heart valve: Secondary | ICD-10-CM

## 2018-03-12 DIAGNOSIS — R451 Restlessness and agitation: Secondary | ICD-10-CM

## 2018-03-12 LAB — GLUCOSE, CAPILLARY
GLUCOSE-CAPILLARY: 95 mg/dL (ref 65–99)
GLUCOSE-CAPILLARY: 112 mg/dL — AB (ref 65–99)
GLUCOSE-CAPILLARY: 106 mg/dL — AB (ref 65–99)
Glucose-Capillary: 102 mg/dL — ABNORMAL HIGH (ref 65–99)
Glucose-Capillary: 107 mg/dL — ABNORMAL HIGH (ref 65–99)
Glucose-Capillary: 108 mg/dL — ABNORMAL HIGH (ref 65–99)
Glucose-Capillary: 114 mg/dL — ABNORMAL HIGH (ref 65–99)
Glucose-Capillary: 115 mg/dL — ABNORMAL HIGH (ref 65–99)
Glucose-Capillary: 116 mg/dL — ABNORMAL HIGH (ref 65–99)
Glucose-Capillary: 118 mg/dL — ABNORMAL HIGH (ref 65–99)
Glucose-Capillary: 135 mg/dL — ABNORMAL HIGH (ref 65–99)
Glucose-Capillary: 97 mg/dL (ref 65–99)

## 2018-03-12 LAB — POCT I-STAT, CHEM 8
BUN: 8 mg/dL (ref 6–20)
CALCIUM ION: 1.07 mmol/L — AB (ref 1.15–1.40)
CHLORIDE: 98 mmol/L — AB (ref 101–111)
Creatinine, Ser: 0.5 mg/dL — ABNORMAL LOW (ref 0.61–1.24)
GLUCOSE: 115 mg/dL — AB (ref 65–99)
HCT: 28 % — ABNORMAL LOW (ref 39.0–52.0)
Hemoglobin: 9.5 g/dL — ABNORMAL LOW (ref 13.0–17.0)
Potassium: 3.4 mmol/L — ABNORMAL LOW (ref 3.5–5.1)
Sodium: 138 mmol/L (ref 135–145)
TCO2: 26 mmol/L (ref 22–32)

## 2018-03-12 LAB — POCT I-STAT 3, ART BLOOD GAS (G3+)
ACID-BASE EXCESS: 1 mmol/L (ref 0.0–2.0)
BICARBONATE: 24.7 mmol/L (ref 20.0–28.0)
O2 SAT: 91 %
TCO2: 26 mmol/L (ref 22–32)
pCO2 arterial: 32.8 mmHg (ref 32.0–48.0)
pH, Arterial: 7.481 — ABNORMAL HIGH (ref 7.350–7.450)
pO2, Arterial: 53 mmHg — ABNORMAL LOW (ref 83.0–108.0)

## 2018-03-12 LAB — BASIC METABOLIC PANEL
Anion gap: 7 (ref 5–15)
BUN: 11 mg/dL (ref 6–20)
CALCIUM: 7.4 mg/dL — AB (ref 8.9–10.3)
CO2: 25 mmol/L (ref 22–32)
Chloride: 103 mmol/L (ref 101–111)
Creatinine, Ser: 0.69 mg/dL (ref 0.61–1.24)
GFR calc non Af Amer: >60 mL/min (ref >60)
GLUCOSE: 94 mg/dL (ref 65–99)
POTASSIUM: 3.6 mmol/L (ref 3.5–5.1)
Sodium: 135 mmol/L (ref 135–145)

## 2018-03-12 LAB — CBC
HCT: 21.2 % — ABNORMAL LOW (ref 39.0–52.0)
HCT: 28.8 % — ABNORMAL LOW (ref 39.0–52.0)
HEMATOCRIT: 25.6 % — AB (ref 39.0–52.0)
HEMOGLOBIN: 8.1 g/dL — AB (ref 13.0–17.0)
HEMOGLOBIN: 9.3 g/dL — AB (ref 13.0–17.0)
Hemoglobin: 6.8 g/dL — CL (ref 13.0–17.0)
MCH: 28.1 pg (ref 26.0–34.0)
MCH: 28.5 pg (ref 26.0–34.0)
MCH: 28.3 pg (ref 26.0–34.0)
MCHC: 31.6 g/dL (ref 30.0–36.0)
MCHC: 32.1 g/dL (ref 30.0–36.0)
MCHC: 32.3 g/dL (ref 30.0–36.0)
MCV: 88.9 fL (ref 78.0–100.0)
MCV: 88.7 fL (ref 78.0–100.0)
MCV: 87.5 fL (ref 78.0–100.0)
PLATELETS: 144 10*3/uL — AB (ref 150–400)
Platelets: 133 10*3/uL — ABNORMAL LOW (ref 150–400)
Platelets: 135 10*3/uL — ABNORMAL LOW (ref 150–400)
RBC: 2.88 MIL/uL — ABNORMAL LOW (ref 4.22–5.81)
RBC: 2.39 MIL/uL — ABNORMAL LOW (ref 4.22–5.81)
RBC: 3.29 MIL/uL — ABNORMAL LOW (ref 4.22–5.81)
RDW: 18.7 % — AB (ref 11.5–15.5)
RDW: 18.7 % — AB (ref 11.5–15.5)
RDW: 17.8 % — AB (ref 11.5–15.5)
WBC: 12.3 10*3/uL — AB (ref 4.0–10.5)
WBC: 12 10*3/uL — AB (ref 4.0–10.5)
WBC: 12.1 10*3/uL — AB (ref 4.0–10.5)

## 2018-03-12 LAB — CREATININE, SERUM
CREATININE: 0.69 mg/dL (ref 0.61–1.24)
Creatinine, Ser: 0.64 mg/dL (ref 0.61–1.24)
GFR calc non Af Amer: >60 mL/min (ref >60)

## 2018-03-12 LAB — POCT I-STAT 4, (NA,K, GLUC, HGB,HCT)
Glucose, Bld: 117 mg/dL — ABNORMAL HIGH (ref 65–99)
HEMATOCRIT: 23 % — AB (ref 39.0–52.0)
HEMOGLOBIN: 7.8 g/dL — AB (ref 13.0–17.0)
POTASSIUM: 3.6 mmol/L (ref 3.5–5.1)
SODIUM: 139 mmol/L (ref 135–145)

## 2018-03-12 LAB — MAGNESIUM
Magnesium: 2.6 mg/dL — ABNORMAL HIGH (ref 1.7–2.4)
Magnesium: 2.2 mg/dL (ref 1.7–2.4)

## 2018-03-12 LAB — ACID FAST SMEAR (AFB): ACID FAST SMEAR - AFSCU2: NEGATIVE

## 2018-03-12 LAB — ANA W/REFLEX IF POSITIVE: Anti Nuclear Antibody(ANA): NEGATIVE

## 2018-03-12 LAB — HEPATITIS PANEL, ACUTE
HCV Ab: 0.2 s/co ratio (ref 0.0–0.9)
HEP A IGM: NEGATIVE
Hep B C IgM: NEGATIVE
Hepatitis B Surface Ag: NEGATIVE

## 2018-03-12 LAB — VANCOMYCIN, TROUGH: VANCOMYCIN TR: 18 ug/mL (ref 15–20)

## 2018-03-12 LAB — PREPARE RBC (CROSSMATCH)

## 2018-03-12 MED ORDER — POTASSIUM CHLORIDE 10 MEQ/50ML IV SOLN
10.0000 meq | INTRAVENOUS | Status: AC
  Administered 2018-03-12 (×3): 10 meq via INTRAVENOUS
  Filled 2018-03-12 (×3): qty 50

## 2018-03-12 MED ORDER — FUROSEMIDE 10 MG/ML IJ SOLN
20.0000 mg | Freq: Two times a day (BID) | INTRAMUSCULAR | Status: DC
  Administered 2018-03-13 – 2018-03-14 (×3): 20 mg via INTRAVENOUS
  Filled 2018-03-12 (×3): qty 2

## 2018-03-12 MED ORDER — INSULIN DETEMIR 100 UNIT/ML ~~LOC~~ SOLN
10.0000 [IU] | Freq: Two times a day (BID) | SUBCUTANEOUS | Status: DC
  Administered 2018-03-12 (×2): 10 [IU] via SUBCUTANEOUS
  Filled 2018-03-12 (×3): qty 0.1

## 2018-03-12 MED ORDER — SODIUM CHLORIDE 0.9% FLUSH: 10.0000 mL | INTRAVENOUS | Status: DC | PRN

## 2018-03-12 MED ORDER — FUROSEMIDE 10 MG/ML IJ SOLN
40.0000 mg | Freq: Once | INTRAMUSCULAR | Status: AC
  Administered 2018-03-12: 40 mg via INTRAVENOUS

## 2018-03-12 MED ORDER — ENOXAPARIN SODIUM 40 MG/0.4ML ~~LOC~~ SOLN
40.0000 mg | Freq: Every day | SUBCUTANEOUS | Status: DC
  Administered 2018-03-12 – 2018-03-18 (×7): 40 mg via SUBCUTANEOUS
  Filled 2018-03-12 (×7): qty 0.4

## 2018-03-12 MED ORDER — CHLORHEXIDINE GLUCONATE CLOTH 2 % EX PADS
6.0000 | MEDICATED_PAD | Freq: Every day | CUTANEOUS | Status: DC
  Administered 2018-03-12 – 2018-03-19 (×7): 6 via TOPICAL

## 2018-03-12 MED ORDER — POTASSIUM CHLORIDE 10 MEQ/50ML IV SOLN
10.0000 meq | INTRAVENOUS | Status: AC
  Administered 2018-03-12 (×3): 10 meq via INTRAVENOUS

## 2018-03-12 MED ORDER — SODIUM CHLORIDE 0.9% FLUSH
10.0000 mL | Freq: Two times a day (BID) | INTRAVENOUS | Status: DC
  Administered 2018-03-12: 10 mL
  Administered 2018-03-12: 40 mL
  Administered 2018-03-12 – 2018-03-15 (×6): 10 mL
  Administered 2018-03-15: 30 mL
  Administered 2018-03-16 (×2): 10 mL
  Administered 2018-03-17: 20 mL
  Administered 2018-03-18 – 2018-03-19 (×2): 10 mL

## 2018-03-12 MED ORDER — INSULIN ASPART 100 UNIT/ML ~~LOC~~ SOLN
0.0000 [IU] | SUBCUTANEOUS | Status: DC
  Administered 2018-03-13 – 2018-03-15 (×5): 2 [IU] via SUBCUTANEOUS

## 2018-03-12 MED ORDER — POTASSIUM CHLORIDE 10 MEQ/50ML IV SOLN
10.0000 meq | INTRAVENOUS | Status: AC
  Administered 2018-03-12 (×2): 10 meq via INTRAVENOUS
  Filled 2018-03-12 (×2): qty 50

## 2018-03-12 MED FILL — Phenylephrine HCl Inj 10 MG/ML: INTRAMUSCULAR | Qty: 2 | Status: AC

## 2018-03-12 MED FILL — Magnesium Sulfate Inj 50%: INTRAMUSCULAR | Qty: 10 | Status: AC

## 2018-03-12 MED FILL — Potassium Chloride Inj 2 mEq/ML: INTRAVENOUS | Qty: 40 | Status: AC

## 2018-03-12 MED FILL — Heparin Sodium (Porcine) Inj 1000 Unit/ML: INTRAMUSCULAR | Qty: 30 | Status: AC

## 2018-03-12 MED FILL — Dexmedetomidine HCl in NaCl 0.9% IV Soln 400 MCG/100ML: INTRAVENOUS | Qty: 100 | Status: AC

## 2018-03-12 MED FILL — Sodium Chloride IV Soln 0.9%: INTRAVENOUS | Qty: 250 | Status: AC

## 2018-03-12 MED FILL — Heparin Sodium (Porcine) Inj 1000 Unit/ML: INTRAMUSCULAR | Qty: 2500 | Status: AC

## 2018-03-12 NOTE — Progress Notes (Signed)
TCTS BRIEF SICU PROGRESS NOTE  1 Day Post-Op  S/P Procedure(s) (LRB): AORTIC VALVE REPLACEMENT (AVR) USING 21 MM MAGNA EASE PERICARDIAL BIOPROSTHESIS-AORTIC, MODEL 3300TFX, SERIAL # [id number] (N/A) VIDEO BRONCHOSCOPY (N/A)   Sedated on vent NSR w/ stable hemodynamics on Levophe 7 O2 sats 94-97% on 50% FiO2 Diuresing fairly well  Plan: Continue current plan  Purcell Nailslarence H Grafton Warzecha, MD 03/12/2018 6:49 PM

## 2018-03-12 NOTE — Addendum Note (Signed)
Addendum  created 03/12/18 0756 by Margarita RanaHoltzman, Yulieth Carrender [place], CRNA   Charge Capture section accepted, Visit diagnoses modified

## 2018-03-12 NOTE — Progress Notes (Signed)
Subjective: Intubated, sedated, trying to pull ET tube out but gloved, restrained   Antibiotics:  Anti-infectives (From admission, onward)   Start     Dose/Rate Route Frequency Ordered Stop   03/11/18 1830  vancomycin (VANCOCIN) IVPB 1000 mg/200 mL premix  Status:  Discontinued     1,000 mg 200 mL/hr over 60 Minutes Intravenous  Once 03/11/18 1141 03/11/18 1145   03/11/18 1430  ceFAZolin (ANCEF) IVPB 2g/100 mL premix  Status:  Discontinued     2 g 200 mL/hr over 30 Minutes Intravenous Every 8 hours 03/11/18 1141 03/11/18 2023   03/11/18 0815  vancomycin (VANCOCIN) 500 mg in sodium chloride 0.9 % 100 mL IVPB     500 mg 100 mL/hr over 60 Minutes Intravenous To Surgery 03/11/18 0806 03/11/18 1225   03/11/18 0400  vancomycin (VANCOCIN) 1,250 mg in sodium chloride 0.9 % 250 mL IVPB  Status:  Discontinued     1,250 mg 166.7 mL/hr over 90 Minutes Intravenous To Surgery 03/10/18 1919 03/11/18 1140   03/11/18 0400  cefUROXime (ZINACEF) 1.5 g in sodium chloride 0.9 % 100 mL IVPB     1.5 g 200 mL/hr over 30 Minutes Intravenous To Surgery 03/10/18 1919 03/11/18 1225   03/11/18 0400  cefUROXime (ZINACEF) 750 mg in sodium chloride 0.9 % 100 mL IVPB  Status:  Discontinued     750 mg 200 mL/hr over 30 Minutes Intravenous To Surgery 03/10/18 1917 03/11/18 1140   03/10/18 1800  cefTRIAXone (ROCEPHIN) 2 g in sodium chloride 0.9 % 100 mL IVPB     2 g 200 mL/hr over 30 Minutes Intravenous Every 24 hours 03/09/18 1756     03/10/18 0400  vancomycin (VANCOCIN) IVPB 750 mg/150 ml premix     750 mg 150 mL/hr over 60 Minutes Intravenous Every 8 hours 03/09/18 1719     03/09/18 1800  cefTRIAXone (ROCEPHIN) 1 g in sodium chloride 0.9 % 100 mL IVPB     1 g 200 mL/hr over 30 Minutes Intravenous  Once 03/09/18 1756 03/09/18 1925   03/09/18 1730  vancomycin (VANCOCIN) 1,500 mg in sodium chloride 0.9 % 500 mL IVPB     1,500 mg 250 mL/hr over 120 Minutes Intravenous  Once 03/09/18 1717 03/09/18 2303     03/09/18 1330  cefTRIAXone (ROCEPHIN) 1 g in sodium chloride 0.9 % 100 mL IVPB     1 g 200 mL/hr over 30 Minutes Intravenous  Once 03/09/18 1318 03/09/18 1508   03/09/18 1330  azithromycin (ZITHROMAX) 500 mg in sodium chloride 0.9 % 250 mL IVPB     500 mg 250 mL/hr over 60 Minutes Intravenous  Once 03/09/18 1318 03/09/18 1634      Medications: Scheduled Meds: . acetaminophen  1,000 mg Oral Q6H   Or  . acetaminophen (TYLENOL) oral liquid 160 mg/5 mL  1,000 mg Per Tube Q6H  . acetaminophen (TYLENOL) oral liquid 160 mg/5 mL  650 mg Per Tube Once   Or  . acetaminophen  650 mg Rectal Once  . aspirin EC  325 mg Oral Daily   Or  . aspirin  324 mg Per Tube Daily  . bisacodyl  10 mg Oral Daily   Or  . bisacodyl  10 mg Rectal Daily  . chlorhexidine gluconate (MEDLINE KIT)  15 mL Mouth Rinse BID  . Chlorhexidine Gluconate Cloth  6 each Topical Daily  . docusate sodium  200 mg Oral Daily  . enoxaparin (LOVENOX) injection  40  mg Subcutaneous QHS  . furosemide  20 mg Intravenous BID  . insulin aspart  0-24 Units Subcutaneous Q4H  . insulin detemir  10 Units Subcutaneous BID  . mouth rinse  15 mL Mouth Rinse 10 times per day  . metoprolol tartrate  12.5 mg Oral BID   Or  . metoprolol tartrate  12.5 mg Per Tube BID  . [START ON 03/13/2018] pantoprazole  40 mg Oral Daily  . sodium chloride flush  10-40 mL Intracatheter Q12H  . sodium chloride flush  3 mL Intravenous Q12H   Continuous Infusions: . sodium chloride 20 mL/hr at 03/12/18 0800  . sodium chloride    . sodium chloride Stopped (03/12/18 0900)  . albumin human    . cefTRIAXone (ROCEPHIN)  IV Stopped (03/11/18 1923)  . dexmedetomidine (PRECEDEX) IV infusion 0.5 mcg/kg/hr (03/12/18 1117)  . DOBUTamine 5 mcg/kg/min (03/12/18 0800)  . lactated ringers    . lactated ringers    . lactated ringers 20 mL/hr at 03/12/18 0800  . nitroGLYCERIN Stopped (03/11/18 1228)  . norepinephrine (LEVOPHED) Adult infusion 4 mcg/min (03/12/18 1026)   . phenylephrine (NEO-SYNEPHRINE) Adult infusion Stopped (03/11/18 1231)  . vancomycin Stopped (03/12/18 0500)  . vasopressin (PITRESSIN) infusion - *FOR SHOCK* Stopped (03/12/18 0929)   PRN Meds:.sodium chloride, albumin human, lactated ringers, metoprolol tartrate, midazolam, morphine injection, ondansetron (ZOFRAN) IV, oxyCODONE, sodium chloride flush, sodium chloride flush, traMADol    Objective: Weight change: 8 lb 2.5 oz (3.7 kg)  Intake/Output Summary (Last 24 hours) at 03/12/2018 1146 Last data filed at 03/12/2018 1100 Gross per 24 hour  Intake 4941.62 ml  Output 2960 ml  Net 1981.62 ml   Blood pressure (!) 86/61, pulse 87, temperature (!) 96.1 F (35.6 C), temperature source Core, resp. rate (!) 29, height 5' 3" (1.6 m), weight 160 lb 7.9 oz (72.8 kg), SpO2 98 %. Temp:  [96.1 F (35.6 C)-100.9 F (38.3 C)] 96.1 F (35.6 C) (04/09 1117) Pulse Rate:  [75-99] 87 (04/09 1117) Resp:  [16-34] 29 (04/09 1117) BP: (78-123)/(57-90) 86/61 (04/09 1117) SpO2:  [93 %-100 %] 98 % (04/09 1117) Arterial Line BP: (81-135)/(43-74) 120/58 (04/09 1117) FiO2 (%):  [40 %-70 %] 40 % (04/09 1117) Weight:  [160 lb 7.9 oz (72.8 kg)] 160 lb 7.9 oz (72.8 kg) (04/09 0600)  Physical Exam: General:intubated he is a bit agitated and trying to get at the endotracheal tube. HEENT: anicteric sclera, EOMI CVS regular rate, normal r,  Chest: chest tubes in place Abdomen: soft  nondistended,  Extremities: no  clubbing or edema noted bilaterally Skin: no rashes Neuro: nonfocal  CBC:  CBC Latest Ref Rng & Units 03/12/2018 03/12/2018 03/12/2018  WBC 4.0 - 10.5 K/uL - 12.0(H) 12.3(H)  Hemoglobin 13.0 - 17.0 g/dL 7.8(L) 6.8(LL) 8.1(L)  Hematocrit 39.0 - 52.0 % 23.0(L) 21.2(L) 25.6(L)  Platelets 150 - 400 K/uL - 144(L) 133(L)     BMET Recent Labs    03/11/18 0304  03/11/18 1752 03/12/18 0352 03/12/18 0755 03/12/18 0937  NA 133*   < > 136 135  --  139  K 3.7   < > 4.5 3.6  --  3.6  CL 96*   < > 99*  103  --   --   CO2 27  --   --  25  --   --   GLUCOSE 125*   < > 123* 94  --  117*  BUN 13   < > 11 11  --   --  CREATININE 0.90   < > 0.70 0.69 0.64  --   CALCIUM 7.3*  --   --  7.4*  --   --    < > = values in this interval not displayed.     Liver Panel  Recent Labs    03/09/18 1849 03/11/18 0304  PROT 7.2 6.8  ALBUMIN 1.8* 1.6*  AST 84* 44*  ALT 94* 79*  ALKPHOS 147* 129*  BILITOT 1.7* 1.5*  BILIDIR 0.8* 0.8*  IBILI 0.9 0.7       Sedimentation Rate No results for input(s): ESRSEDRATE in the last 72 hours. C-Reactive Protein No results for input(s): CRP in the last 72 hours.  Micro Results: Recent Results (from the past 720 hour(s))  Respiratory Panel by PCR     Status: None   Collection Time: 03/09/18  6:04 PM  Result Value Ref Range Status   Adenovirus NOT DETECTED NOT DETECTED Final   Coronavirus 229E NOT DETECTED NOT DETECTED Final   Coronavirus HKU1 NOT DETECTED NOT DETECTED Final   Coronavirus NL63 NOT DETECTED NOT DETECTED Final   Coronavirus OC43 NOT DETECTED NOT DETECTED Final   Metapneumovirus NOT DETECTED NOT DETECTED Final   Rhinovirus / Enterovirus NOT DETECTED NOT DETECTED Final   Influenza A NOT DETECTED NOT DETECTED Final   Influenza B NOT DETECTED NOT DETECTED Final   Parainfluenza Virus 1 NOT DETECTED NOT DETECTED Final   Parainfluenza Virus 2 NOT DETECTED NOT DETECTED Final   Parainfluenza Virus 3 NOT DETECTED NOT DETECTED Final   Parainfluenza Virus 4 NOT DETECTED NOT DETECTED Final   Respiratory Syncytial Virus NOT DETECTED NOT DETECTED Final   Bordetella pertussis NOT DETECTED NOT DETECTED Final   Chlamydophila pneumoniae NOT DETECTED NOT DETECTED Final   Mycoplasma pneumoniae NOT DETECTED NOT DETECTED Final    Comment: Performed at [facility], [address], [place], [place] [address]  MRSA PCR Screening     Status: None   Collection Time: 03/09/18  6:04 PM  Result Value Ref Range Status   MRSA by PCR NEGATIVE NEGATIVE  Final    Comment:        The GeneXpert MRSA Assay (FDA approved for NASAL specimens only), is one component of a comprehensive MRSA colonization surveillance program. It is not intended to diagnose MRSA infection nor to guide or monitor treatment for MRSA infections. Performed at [facility], [place] [address], [place], [place] [address]   Culture, blood (routine x 2)     Status: None (Preliminary result)   Collection Time: 03/09/18  6:41 PM  Result Value Ref Range Status   Specimen Description BLOOD LEFT A-LINE  Final   Special Requests   Final    BOTTLES DRAWN AEROBIC AND ANAEROBIC Blood Culture adequate volume   Culture   Final    NO GROWTH 2 DAYS Performed at [facility], [address] [place], [place] [address]    Report Status PENDING  Incomplete  Culture, blood (routine x 2)     Status: None (Preliminary result)   Collection Time: 03/09/18  6:42 PM  Result Value Ref Range Status   Specimen Description BLOOD LEFT ANTECUBITAL  Final   Special Requests   Final    BOTTLES DRAWN AEROBIC AND ANAEROBIC Blood Culture adequate volume   Culture   Final    NO GROWTH 2 DAYS Performed at [facility], [place] [address], [place], [place] [address]    Report Status PENDING  Incomplete  Culture, blood (single)     Status: None (Preliminary result)   Collection Time: 03/09/18  6:52 PM  Result Value Ref Range Status   Specimen Description BLOOD LEFT HAND  Final   Special Requests   Final    BOTTLES DRAWN AEROBIC AND ANAEROBIC Blood Culture adequate volume   Culture   Final    NO GROWTH 2 DAYS Performed at [facility], [address] [place], [place] [address]    Report Status PENDING  Incomplete  Surgical PCR screen     Status: None   Collection Time: 03/10/18  6:56 PM  Result Value Ref Range Status   MRSA, PCR NEGATIVE NEGATIVE Final   Staphylococcus aureus NEGATIVE NEGATIVE Final    Comment: (NOTE) The Xpert SA Assay (FDA approved for NASAL specimens  in patients 23 years of age and older), is one component of a comprehensive surveillance program. It is not intended to diagnose infection nor to guide or monitor treatment. Performed at [facility], [place] [address] [place], [place] [address]   Aerobic/Anaerobic Culture (surgical/deep wound)     Status: None (Preliminary result)   Collection Time: 03/11/18  9:22 AM  Result Value Ref Range Status   Specimen Description WOUND  Final   Special Requests AORTIC VALVE  Final   Gram Stain   Final    RARE WBC PRESENT, PREDOMINANTLY MONONUCLEAR NO ORGANISMS SEEN    Culture   Final    NO GROWTH < 24 HOURS Performed at [facility] [address], [place], [place] [address]    Report Status PENDING  Incomplete  Acid Fast Smear (AFB)     Status: None   Collection Time: 03/11/18  9:22 AM  Result Value Ref Range Status   AFB Specimen Processing Concentration  Final   Acid Fast Smear Negative  Final    Comment: (NOTE) Performed At: [facility] [place], [place] [id number] Rush Farmer MD OZ:[id number]    Source (AFB) AORTIC VALVE  Final    Comment: Performed at [facility], [place] [address] [place],  [address]    Studies/Results: Dg Chest Port 1 View  Result Date: 03/12/2018 CLINICAL DATA:  Hypoxia EXAM: PORTABLE CHEST 1 VIEW COMPARISON:  March 11, 2018 FINDINGS: Endotracheal tube tip is 2.8 cm above the carina. Central catheter tip is in the superior vena cava. Nasogastric tube tip and side port are below the diaphragm. There are stable mediastinal drains. Temporary pacemaker wires are attached to the right heart. No pneumothorax. There is widespread interstitial and alveolar edema, stable. No new opacity evident. Heart is prominent with pulmonary vascularity within normal limits, stable. No adenopathy. No bone lesions. IMPRESSION: Tube and catheter positions as described without pneumothorax. Interstitial and alveolar edema persist without  change. No new opacity evident. Stable cardiac silhouette. Electronically Signed   By: Lowella Grip III M.D.   On: 03/12/2018 07:28   Dg Chest Port 1 View  Result Date: 03/11/2018 CLINICAL DATA:  Hypoxia EXAM: PORTABLE CHEST 1 VIEW COMPARISON:  March 11, 2018 study obtained earlier in the day FINDINGS: Endotracheal tube tip is 1.6 cm above the carina. Central catheter tip is in superior vena cava. There are bilateral chest tubes. Nasogastric tube tip and side port are below the diaphragm. There are temporary pacemaker leads attached to the right heart. No pneumothorax. There is patchy interstitial and alveolar edema bilaterally with somewhat less consolidation in the left lower lobe compared  to earlier in the day. No new opacity. Heart size and pulmonary vascularity are normal. No adenopathy. IMPRESSION: Tube and catheter positions as described without pneumothorax. Note that the endotracheal tube tip is close to the carina and may wish to be withdrawn 2-3 cm. Extensive interstitial and patchy alveolar opacity, likely edema, somewhat less on the left than on the study obtained earlier in the day. Changes on the right are similar. Electronically Signed   By: Lowella Grip III M.D.   On: 03/11/2018 12:12   Dg Chest Portable 1 View  Result Date: 03/11/2018 CLINICAL DATA:  Respiratory failure EXAM: PORTABLE CHEST 1 VIEW COMPARISON:  Yesterday FINDINGS: Endotracheal tube tip between the clavicular heads and carina. Right IJ catheter and orogastric tube in stable good position. Extensive airspace disease, asymmetric to the right. No visible pleural fluid or pneumothorax. Asymmetric density at the right base attributed to lung obscuring the right heart border. IMPRESSION: 1. Stable hardware positioning. 2. History of cardiogenic shock unchanged extensive airspace disease. Electronically Signed   By: Monte Fantasia M.D.   On: 03/11/2018 07:30      Assessment/Plan:  INTERVAL HISTORY: Cultures from blood  and heart valve tissue so far unrevealing. Principal Problem:   Aortic valve endocarditis Active Problems:   Acute respiratory failure with hypoxia (HCC)   Acute decompensated heart failure (HCC)   S/P AVR    John Byrd is a 57 y.o. 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 Roxan Hockey with AV vegetation sent to micro and path, sp AVR  #1 AV endocarditis: continue vancomycin, ceftriaxone  I have submitted paperwork to Pathology dept to have valve sent to [facility] for PCR broad ranging bacterial, fungal, AFB as well as for specifically Bartonella, Legionella, T. Whippleii   they confirmed to have appropriate specimen to send this usually the turnaround is about 2 weeks time.     LOS: 3 days   Alcide Evener 03/12/2018, 11:46 AM

## 2018-03-12 NOTE — Progress Notes (Signed)
ABG performed on PS 10/5 40% weaning  Arterial Blood Gas result:  pO2 53; pCO2 32.8; pH 7.48;  HCO3 24.7, %O2 Sat 91.

## 2018-03-12 NOTE — Progress Notes (Signed)
PULMONARY / CRITICAL CARE MEDICINE   Name: John Byrd MRN: [id number] DOB: Mar 04, 1961    ADMISSION DATE:  03/09/2018 CONSULTATION DATE:  4/6/419  REFERRING MD:  Enedina Finner  CHIEF COMPLAINT:  dyspnea  HISTORY OF PRESENT ILLNESS:   57 year old male nonsmoker with no known past medical history who presents 4/6 with 3 weeks of progressive dyspnea, lower extremity edema and nonproductive cough.  He had a chest x-ray performed 1 week prior to admit for this complaint which showed multifocal airspace opacities however he has not followed up for additional care for this.  He denies fevers, night sweats, weight loss, chest pain, nausea, vomiting, diarrhea, myalgias, sick contacts.  No sore throat.  Did report that he was having occasional chills a couple of months ago that improved with taking over-the-counter medicine.  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  He is from [place] though his girlfriend lives here.    In the emergency department he was noted to be acutely tachypneic, oxygen saturation in the 80s on room air which did not improve with a nonrebreather and so he was started on BiPAP and given a dose of Lasix, ceftriaxone and azithromycin.    4/8- patient found to have significant aortic regurgitation and large embolus. Did not improve with conservative treatment. Currently s/p aortic valve replacement    SUBJECTIVE:  Awake and alert, Following simple commands Weaning on CPAP/PS Complains of CT pain TTE showed severe AI with vegetation.  On levophed-weaning at 4 Dobutamine at 5   VITAL SIGNS: BP 93/68   Pulse 88   Temp (!) 96.3 F (35.7 C) (Core)   Resp 18   Ht 5\' 3"  (1.6 m)   Wt 160 lb 7.9 oz (72.8 kg)   SpO2 95%   BMI 28.43 kg/m   HEMODYNAMICS: PAP: (30-48)/(7-24) 35/16 CO:  [3.6 L/min-5.9 L/min] 5.9 L/min CI:  [2.1 L/min/m2-3.5 L/min/m2] 3.4 L/min/m2  VENTILATOR  SETTINGS: Vent Mode: PRVC FiO2 (%):  [40 %-80 %] 40 % Set Rate:  [20 bmp] 20 bmp Vt Set:  [460 mL] 460 mL PEEP:  [8 cmH20-10 cmH20] 8 cmH20 Plateau Pressure:  [16 cmH20-27 cmH20] 18 cmH20  INTAKE / OUTPUT: I/O last 3 completed shifts: In: 8338.7 [I.V.:4406.8; Blood:1162; NG/GT:120; IV Piggyback:2650] Out: 6265 [Urine:5200; Emesis/NG output:25; Blood:860; Chest Tube:180]  PHYSICAL EXAMINATION: General:male lying in bed in NAD on vent minimal sedation Neuro: Sedated on CPAP/PS, RASS -2 mm moist, ETT HEENT: ETT, + JVDm NCAT Cardiovascular: S1-S2, + M, No RG Lungs: Respiration regular and unlabored, NAD, coarse throughout, crackles per bases Abdomen: Round, soft, hypoactive bowel sounds Musculoskeletal: Warm and dry, 1-2+ BLE edema   LABS:  BMET Recent Labs  Lab 03/10/18 1700 03/11/18 0304  03/11/18 1045  03/11/18 1752 03/12/18 0352 03/12/18 0755 03/12/18 0937  NA 134* 133*   < > 137   < > 136 135  --  139  K 3.4* 3.7   < > 3.2*   < > 4.5 3.6  --  3.6  CL 100* 96*   < > 96*  --  99* 103  --   --   CO2 26 27  --   --   --   --  25  --   --   BUN 14 13   < > 10  --  11 11  --   --   CREATININE 0.83 0.90   < > 0.60*   < >  0.70 0.69 0.64  --   GLUCOSE 111* 125*   < > 129*   < > 123* 94  --  117*   < > = values in this interval not displayed.    Electrolytes Recent Labs  Lab 03/10/18 0456 03/10/18 1700 03/11/18 0304 03/11/18 1750 03/12/18 0352  CALCIUM 7.2* 7.4* 7.3*  --  7.4*  MG 2.2 2.1 2.1 3.5* 2.6*  PHOS 4.2 3.9 3.5  --   --     CBC Recent Labs  Lab 03/11/18 1750  03/12/18 0352 03/12/18 0755 03/12/18 0937  WBC 16.8*  --  12.3* 12.0*  --   HGB 8.7*   < > 8.1* 6.8* 7.8*  HCT 27.5*   < > 25.6* 21.2* 23.0*  PLT 143*  --  133* 144*  --    < > = values in this interval not displayed.    Coag's Recent Labs  Lab 03/10/18 1854 03/11/18 1208  APTT 34 36  INR 1.28 1.78    Sepsis Markers Recent Labs  Lab 03/09/18 1849 03/10/18 0928 03/11/18 0304   LATICACIDVEN 1.1  --   --   PROCALCITON 0.29 1.01 0.84    ABG Recent Labs  Lab 03/11/18 1049 03/11/18 1201 03/11/18 1722  PHART 7.469* 7.371 7.329*  PCO2ART 37.7 49.8* 52.3*  PO2ART 81.0* 86.0 77.0*    Liver Enzymes Recent Labs  Lab 03/09/18 1108 03/09/18 1849 03/11/18 0304  AST 74* 84* 44*  ALT 95* 94* 79*  ALKPHOS 151* 147* 129*  BILITOT 1.8* 1.7* 1.5*  ALBUMIN 1.9* 1.8* 1.6*    Cardiac Enzymes No results for input(s): TROPONINI, PROBNP in the last 168 hours.  Glucose Recent Labs  Lab 03/12/18 0349 03/12/18 0455 03/12/18 0556 03/12/18 0659 03/12/18 0811 03/12/18 0840  GLUCAP 97 102* 112* 135* 108* 114*    Imaging Dg Chest Port 1 View  Result Date: 03/12/2018 CLINICAL DATA:  Hypoxia EXAM: PORTABLE CHEST 1 VIEW COMPARISON:  March 11, 2018 FINDINGS: Endotracheal tube tip is 2.8 cm above the carina. Central catheter tip is in the superior vena cava. Nasogastric tube tip and side port are below the diaphragm. There are stable mediastinal drains. Temporary pacemaker wires are attached to the right heart. No pneumothorax. There is widespread interstitial and alveolar edema, stable. No new opacity evident. Heart is prominent with pulmonary vascularity within normal limits, stable. No adenopathy. No bone lesions. IMPRESSION: Tube and catheter positions as described without pneumothorax. Interstitial and alveolar edema persist without change. No new opacity evident. Stable cardiac silhouette. Electronically Signed   By: Bretta BangWilliam  Woodruff III M.D.   On: 03/12/2018 07:28   Dg Chest Port 1 View  Result Date: 03/11/2018 CLINICAL DATA:  Hypoxia EXAM: PORTABLE CHEST 1 VIEW COMPARISON:  March 11, 2018 study obtained earlier in the day FINDINGS: Endotracheal tube tip is 1.6 cm above the carina. Central catheter tip is in superior vena cava. There are bilateral chest tubes. Nasogastric tube tip and side port are below the diaphragm. There are temporary pacemaker leads attached to the  right heart. No pneumothorax. There is patchy interstitial and alveolar edema bilaterally with somewhat less consolidation in the left lower lobe compared to earlier in the day. No new opacity. Heart size and pulmonary vascularity are normal. No adenopathy. IMPRESSION: Tube and catheter positions as described without pneumothorax. Note that the endotracheal tube tip is close to the carina and may wish to be withdrawn 2-3 cm. Extensive interstitial and patchy alveolar opacity, likely edema, somewhat less on  the left than on the study obtained earlier in the day. Changes on the right are similar. Electronically Signed   By: Bretta Bang III M.D.   On: 03/11/2018 12:12     STUDIES:  2D echo 4/6>>> severe AI, aortic valve vegetation   CULTURES: BC x 2 4/6>>> BC x 2 4/7>>> AFB 4/8>>Negative x 1 Surgical Culture Aortic valve 4/8>>  Fungal aortic valve leaflets 4/8   ANTIBIOTICS: vanc 4/6>>> Rocephin 4/6>>> azithro 4/6>>>   SIGNIFICANT EVENTS: 4/9 Aortic Valve Replacement  LINES/TUBES: ETT 4/6>>> R IJ CVL 4/6>>>   DISCUSSION: 57 year old male with no known history admitted 4/7 with acute respiratory failure, found to have acute diastolic CHF due to aortic insufficiency in the setting of subacute bacterial endocarditis.  ASSESSMENT / PLAN:  PULMONARY Ventilator dependent respiratory failure secondary to Acute hypoxemic respiratory failure secondary to severe aortic valve regurgitation  Tolerating CPAP/PS 4/9 after lasix P:   Vent support - 8cc/kg  Wean FiO2 and PEEP as able SBT Q am  CXR/ABG prn PEEP weaned to 5 on 4/9 am able   CARDIOVASCULAR Acute diastolic heart failure secondary to aortic valve regurgitation secondary to valve endocarditis s/p bioprosthetic valve replacement  Aortic insufficiency Subacute bacterial endocarditis-aortic valve Shock-cardiogenic P:  Weaning  pressors POD #1  Cardiology and CTS following Tele monitoring MAP goal > 65 EKG  prn   RENAL Hyponatremia- resolved  P:   Follow BMET daily  and Urine output Replace electrolytes as needed Strict I/O  GASTROINTESTINAL Elevated LFTs-likely secondary to hepatic congestion - resolved  P:   N.p.o. for now Famotidine PPX  HEMATOLOGIC Anemia P:  Trend CBC Transfuse PRBC's  per TCTS Monitor for any obvious bleeding   INFECTIOUS Subacute bacterial endocarditis.  No known indwelling hardware or history of IV drug abuse. T max 100.9 P:   Cultures pending-negative to date procalcitonin 0.84 Continue antibiotics Trend fever/ WBC Re- Culture as is clinically indicated  ENDOCRINE No active issue P:   Monitor glucose on chemistries  NEUROLOGIC Sedation needs on vent P:   RASS goal: -1 Precedex gtt Daily wake up assessment Minimize sedation   FAMILY   - Inter-disciplinary family meet or Palliative Care meeting due by:  Day 7  Bevelyn Ngo AGACNP-BC [facility] Pager: [phone]

## 2018-03-12 NOTE — Progress Notes (Signed)
CARDIOLOGY   Status post aortic valve replacement.  Still intubated.  Systolic blood pressure around 100 mmHg.  Atrial paced rhythm at 80.  No sustained arrhythmias.  No pericardial rub or murmur of aortic regurgitation is heard.  Unable to assess neuro a.m. EKG performed at 6:45 demonstrates sinus rhythm, LVH, and is otherwise unremarkable.  Status post aortic valve replacement for endocarditis.  Stable hemodynamics.  Still intubated.  Plan perTCTS.  We will follow and help as required.

## 2018-03-12 NOTE — Care Management Note (Signed)
Case Management Note Donn PieriniKristi Numair Masden RN, BSN Unit 4E-Case Manager-- 2H coverage [phone]  Patient Details  Name: Melton KrebsValentino Santiago Stephenson MRN: [id number] Date of Birth: 07-01-1961  Subjective/Objective:  Pt admitted with resp. 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 - CM to follow for transition of care needs.   Expected Discharge Date:                  Expected Discharge Plan:     In-House Referral:     Discharge planning Services     Post Acute Care Choice:    Choice offered to:     DME Arranged:    DME Agency:     HH Arranged:    HH Agency:     Status of Service:     If discussed at Long Length of Stay Meetings, dates discussed:    Discharge Disposition:   Additional Comments:  Darrold SpanWebster, Berkley Wrightsman Hall, RN 03/12/2018, 10:59 AM

## 2018-03-12 NOTE — Plan of Care (Signed)
  Problem: Clinical Measurements: Goal: Ability to maintain clinical measurements within normal limits will improve Outcome: Progressing Goal: Will remain free from infection Outcome: Progressing Goal: Diagnostic test results will improve Outcome: Progressing Goal: Respiratory complications will improve Outcome: Progressing Goal: Cardiovascular complication will be avoided Outcome: Progressing   Problem: Elimination: Goal: Will not experience complications related to bowel motility Outcome: Progressing Goal: Will not experience complications related to urinary retention Outcome: Progressing   Problem: Pain Managment: Goal: General experience of comfort will improve Outcome: Progressing   Problem: Safety: Goal: Ability to remain free from injury will improve Outcome: Progressing   Problem: Skin Integrity: Goal: Risk for impaired skin integrity will decrease Outcome: Progressing   Problem: Respiratory: Goal: Ability to maintain a clear airway and adequate ventilation will improve Outcome: Progressing   Problem: Cardiac: Goal: Hemodynamic stability will improve Outcome: Progressing   Problem: Clinical Measurements: Goal: Postoperative complications will be avoided or minimized Outcome: Progressing   Problem: Respiratory: Goal: Respiratory status will improve Outcome: Progressing   Problem: Skin Integrity: Goal: Wound healing without signs and symptoms of infection Outcome: Progressing Goal: Risk for impaired skin integrity will decrease Outcome: Progressing   Problem: Urinary Elimination: Goal: Ability to achieve and maintain adequate renal perfusion and functioning will improve Outcome: Progressing   Problem: Education: Goal: Knowledge of General Education information will improve Outcome: Not Progressing   Problem: Health Behavior/Discharge Planning: Goal: Ability to manage health-related needs will improve Outcome: Not Progressing   Problem:  Activity: Goal: Risk for activity intolerance will decrease Outcome: Not Progressing   Problem: Nutrition: Goal: Adequate nutrition will be maintained Outcome: Not Progressing   Problem: Coping: Goal: Level of anxiety will decrease Outcome: Not Progressing   Problem: Education: Goal: Ability to demonstrate proper wound care will improve Outcome: Not Progressing Goal: Knowledge of disease or condition will improve Outcome: Not Progressing Goal: Knowledge of the prescribed therapeutic regimen will improve Outcome: Not Progressing   Problem: Activity: Goal: Risk for activity intolerance will decrease Outcome: Not Progressing

## 2018-03-12 NOTE — Progress Notes (Signed)
Called CCM to report ABG results on ventilator wean. MD wants to administer lasix 40mg  x1 then reassess the situation.

## 2018-03-12 NOTE — Progress Notes (Signed)
Pharmacy Antibiotic Note  John Byrd is a 57 y.o. male admitted on 03/09/2018 with AV endocarditis, now s/p tissue VR on 4/8. Patient continues on vancomycin and ceftriaxone. WBC downtrending, now 12.0; tmax 100.6, currently AF. Scr stable < 1, estimated CrCl >100 mL/min. Patient received an extra 500mg  of vancomycin perioperatively on 4/8. Vancomycin trough drawn this AM within goal range at 18.  Plan: Continue vancomycin 750 mg IV q8h Continue ceftriaxone 2g IV q24h F/u renal function, culture/pathology results, de-escalation, LOT  Height: 5\' 3"  (160 cm) Weight: 160 lb 7.9 oz (72.8 kg) IBW/kg (Calculated) : 56.9  Temp (24hrs), Avg:98.8 F (37.1 C), Min:96.1 F (35.6 C), Max:100.9 F (38.3 C)  Recent Labs  Lab 03/09/18 1849  03/11/18 0303  03/11/18 1045 03/11/18 1208 03/11/18 1750 03/11/18 1752 03/12/18 0352 03/12/18 0755 03/12/18 1100  WBC 16.7*   < > 14.3*  --   --  15.8* 16.8*  --  12.3* 12.0*  --   CREATININE 0.71   < >  --    < > 0.60*  --  0.80 0.70 0.69 0.64  --   LATICACIDVEN 1.1  --   --   --   --   --   --   --   --   --   --   VANCOTROUGH  --   --   --   --   --   --   --   --   --   --  18   < > = values in this interval not displayed.    Estimated Creatinine Clearance: 91.2 mL/min (by C-G formula based on SCr of 0.64 mg/dL).    No Known Allergies  Antimicrobials this admission: Azithro 4/6 x 1 CTX 4/6 >> Vanc 4/6 >>  Dose adjustments this admission: 4/9 VT = 18 >> no changes  Microbiology results: 4/7 BCx: NGTD 4/8 AV Cx: NGTD 4/8 AV fungal: pending 4/8 AV acid fast: pending  Thank you for allowing pharmacy to be a part of this patient's care.  Roderic ScarceErin N. Zigmund Danieleja, PharmD PGY1 Pharmacy Resident Pager: [phone] 03/12/2018 11:45 AM

## 2018-03-12 NOTE — Progress Notes (Signed)
1 Day Post-Op Procedure(s) (LRB): AORTIC VALVE REPLACEMENT (AVR) USING 21 MM MAGNA EASE PERICARDIAL BIOPROSTHESIS-AORTIC, MODEL 3300TFX, SERIAL # [id number] (N/A) VIDEO BRONCHOSCOPY (N/A) Subjective: Alert, pain from chest tubes  Objective: Vital signs in last 24 hours: Temp:  [96.6 F (35.9 C)-100.9 F (38.3 C)] 96.6 F (35.9 C) (04/09 0700) Pulse Rate:  [75-115] 88 (04/09 0700) Cardiac Rhythm: Atrial paced (04/09 0000) Resp:  [12-34] 18 (04/09 0700) BP: (78-123)/(57-90) 120/87 (04/09 0700) SpO2:  [93 %-100 %] 94 % (04/09 0700) Arterial Line BP: (81-137)/(43-76) 132/70 (04/09 0700) FiO2 (%):  [40 %-80 %] 40 % (04/09 0353) Weight:  [160 lb 7.9 oz (72.8 kg)] 160 lb 7.9 oz (72.8 kg) (04/09 0600)  Hemodynamic parameters for last 24 hours: PAP: (30-48)/(7-24) 36/12 CO:  [3.6 L/min-5.9 L/min] 5.9 L/min CI:  [2.1 L/min/m2-3.5 L/min/m2] 3.5 L/min/m2  Intake/Output from previous day: 04/08 0701 - 04/09 0700 In: 6566.7 [I.V.:3444.8; Blood:1162; NG/GT:60; IV Piggyback:1900] Out: 3780 [Urine:2715; Emesis/NG output:25; Blood:860; Chest Tube:180] Intake/Output this shift: No intake/output data recorded.  General appearance: alert and mild distress Neurologic: moving all 4 and communicating Heart: regular rate and rhythm Lungs: rhonchi bilaterally Abdomen: normal findings: soft, non-tender and symmetric  Lab Results: Recent Labs    03/11/18 1750 03/11/18 1752 03/12/18 0352  WBC 16.8*  --  12.3*  HGB 8.7* 8.8* 8.1*  HCT 27.5* 26.0* 25.6*  PLT 143*  --  133*   BMET:  Recent Labs    03/11/18 0304  03/11/18 1752 03/12/18 0352  NA 133*   < > 136 135  K 3.7   < > 4.5 3.6  CL 96*   < > 99* 103  CO2 27  --   --  25  GLUCOSE 125*   < > 123* 94  BUN 13   < > 11 11  CREATININE 0.90   < > 0.70 0.69  CALCIUM 7.3*  --   --  7.4*   < > = values in this interval not displayed.    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 PAP improved post AVR  Wean drips as tolerated  ID- AV endocarditis- on vanco and ceftriaxone pending culture results  RESP_ still with pulmonary edema on CXR. Currently on 40% and 8 PEEP  hopefully can wean vent today  RENAL- creatinine OK, supplement K  Diurese  ENDO- CBG well controlled  Transition to levemir + SSI  HEME- anemia secondary to ABL- follow  SCD + enoxaparin for DVT prophylaxis     LOS: 3 days    Loreli SlotSteven C Irine Heminger 03/12/2018

## 2018-03-13 ENCOUNTER — Inpatient Hospital Stay (HOSPITAL_COMMUNITY): Payer: Self-pay

## 2018-03-13 ENCOUNTER — Encounter (HOSPITAL_COMMUNITY): Payer: Self-pay | Admitting: Thoracic Surgery (Cardiothoracic Vascular Surgery)

## 2018-03-13 LAB — CBC
HEMATOCRIT: 28 % — AB (ref 39.0–52.0)
Hemoglobin: 8.9 g/dL — ABNORMAL LOW (ref 13.0–17.0)
MCH: 28.1 pg (ref 26.0–34.0)
MCHC: 31.8 g/dL (ref 30.0–36.0)
MCV: 88.3 fL (ref 78.0–100.0)
PLATELETS: 134 10*3/uL — AB (ref 150–400)
RBC: 3.17 MIL/uL — ABNORMAL LOW (ref 4.22–5.81)
RDW: 17.9 % — ABNORMAL HIGH (ref 11.5–15.5)
WBC: 11 10*3/uL — AB (ref 4.0–10.5)

## 2018-03-13 LAB — TYPE AND SCREEN
ABO/RH(D): B POS
Antibody Screen: NEGATIVE
UNIT DIVISION: 0
Unit division: 0

## 2018-03-13 LAB — BASIC METABOLIC PANEL
Anion gap: 9 (ref 5–15)
BUN: 10 mg/dL (ref 6–20)
CALCIUM: 7.5 mg/dL — AB (ref 8.9–10.3)
CO2: 26 mmol/L (ref 22–32)
CREATININE: 0.65 mg/dL (ref 0.61–1.24)
Chloride: 100 mmol/L — ABNORMAL LOW (ref 101–111)
GFR calc Af Amer: >60 mL/min (ref >60)
GLUCOSE: 104 mg/dL — AB (ref 65–99)
POTASSIUM: 3.8 mmol/L (ref 3.5–5.1)
SODIUM: 135 mmol/L (ref 135–145)

## 2018-03-13 LAB — BPAM RBC
BLOOD PRODUCT EXPIRATION DATE: 201904122359
BLOOD PRODUCT EXPIRATION DATE: 201904202359
ISSUE DATE / TIME: 201904081425
ISSUE DATE / TIME: 201904091043
UNIT TYPE AND RH: 7300
Unit Type and Rh: 7300

## 2018-03-13 LAB — GLUCOSE, CAPILLARY
GLUCOSE-CAPILLARY: 92 mg/dL (ref 65–99)
GLUCOSE-CAPILLARY: 101 mg/dL — AB (ref 65–99)
Glucose-Capillary: 106 mg/dL — ABNORMAL HIGH (ref 65–99)
Glucose-Capillary: 126 mg/dL — ABNORMAL HIGH (ref 65–99)
Glucose-Capillary: 87 mg/dL (ref 65–99)
Glucose-Capillary: 96 mg/dL (ref 65–99)

## 2018-03-13 LAB — POCT I-STAT 3, ART BLOOD GAS (G3+)
Acid-Base Excess: 1 mmol/L (ref 0.0–2.0)
BICARBONATE: 25.3 mmol/L (ref 20.0–28.0)
O2 SAT: 98 %
PCO2 ART: 37.5 mmHg (ref 32.0–48.0)
PH ART: 7.439 (ref 7.350–7.450)
PO2 ART: 100 mmHg (ref 83.0–108.0)
Patient temperature: 99.9
TCO2: 26 mmol/L (ref 22–32)

## 2018-03-13 LAB — HEPATITIS C ANTIBODY (REFLEX): HCV AB: 0.1 {s_co_ratio} (ref 0.0–0.9)

## 2018-03-13 LAB — HCV COMMENT:

## 2018-03-13 LAB — MAGNESIUM
MAGNESIUM: 2.1 mg/dL (ref 1.7–2.4)
MAGNESIUM: 2.2 mg/dL (ref 1.7–2.4)

## 2018-03-13 LAB — PHOSPHORUS
Phosphorus: 2.8 mg/dL (ref 2.5–4.6)
Phosphorus: 3.2 mg/dL (ref 2.5–4.6)

## 2018-03-13 LAB — HEPATITIS B SURFACE ANTIGEN: Hepatitis B Surface Ag: NEGATIVE

## 2018-03-13 MED ORDER — VITAL AF 1.2 CAL PO LIQD
1000.0000 mL | ORAL | Status: DC
  Administered 2018-03-15: 1000 mL

## 2018-03-13 MED ORDER — VITAL HIGH PROTEIN PO LIQD
1000.0000 mL | ORAL | Status: DC
  Administered 2018-03-13: 1000 mL

## 2018-03-13 MED ORDER — SODIUM CHLORIDE 0.9 % IV SOLN
0.0000 ug/min | INTRAVENOUS | Status: DC
  Administered 2018-03-13 – 2018-03-14 (×3): 10 ug/min via INTRAVENOUS
  Filled 2018-03-13: qty 1
  Filled 2018-03-13: qty 10

## 2018-03-13 MED ORDER — PRO-STAT SUGAR FREE PO LIQD
30.0000 mL | Freq: Two times a day (BID) | ORAL | Status: DC
  Administered 2018-03-13: 30 mL
  Filled 2018-03-13: qty 30

## 2018-03-13 MED ORDER — PRO-STAT SUGAR FREE PO LIQD
30.0000 mL | Freq: Every day | ORAL | Status: DC
  Administered 2018-03-14 – 2018-03-15 (×2): 30 mL
  Filled 2018-03-13 (×2): qty 30

## 2018-03-13 MED ORDER — POTASSIUM CHLORIDE 10 MEQ/50ML IV SOLN
10.0000 meq | INTRAVENOUS | Status: AC
  Administered 2018-03-13 (×4): 10 meq via INTRAVENOUS
  Filled 2018-03-13 (×4): qty 50

## 2018-03-13 MED FILL — Sodium Bicarbonate IV Soln 8.4%: INTRAVENOUS | Qty: 50 | Status: AC

## 2018-03-13 MED FILL — Mannitol IV Soln 20%: INTRAVENOUS | Qty: 500 | Status: AC

## 2018-03-13 MED FILL — Calcium Chloride Inj 10%: INTRAVENOUS | Qty: 10 | Status: AC

## 2018-03-13 MED FILL — Electrolyte-R (PH 7.4) Solution: INTRAVENOUS | Qty: 4000 | Status: AC

## 2018-03-13 MED FILL — Sodium Chloride IV Soln 0.9%: INTRAVENOUS | Qty: 2000 | Status: AC

## 2018-03-13 MED FILL — Heparin Sodium (Porcine) Inj 1000 Unit/ML: INTRAMUSCULAR | Qty: 20 | Status: AC

## 2018-03-13 NOTE — Progress Notes (Signed)
Subjective: Intubated restrained still gesticulating about the endotracheal tube   Antibiotics:  Anti-infectives (From admission, onward)   Start     Dose/Rate Route Frequency Ordered Stop   03/11/18 1830  vancomycin (VANCOCIN) IVPB 1000 mg/200 mL premix  Status:  Discontinued     1,000 mg 200 mL/hr over 60 Minutes Intravenous  Once 03/11/18 1141 03/11/18 1145   03/11/18 1430  ceFAZolin (ANCEF) IVPB 2g/100 mL premix  Status:  Discontinued     2 g 200 mL/hr over 30 Minutes Intravenous Every 8 hours 03/11/18 1141 03/11/18 2023   03/11/18 0815  vancomycin (VANCOCIN) 500 mg in sodium chloride 0.9 % 100 mL IVPB     500 mg 100 mL/hr over 60 Minutes Intravenous To Surgery 03/11/18 0806 03/11/18 1225   03/11/18 0400  vancomycin (VANCOCIN) 1,250 mg in sodium chloride 0.9 % 250 mL IVPB  Status:  Discontinued     1,250 mg 166.7 mL/hr over 90 Minutes Intravenous To Surgery 03/10/18 1919 03/11/18 1140   03/11/18 0400  cefUROXime (ZINACEF) 1.5 g in sodium chloride 0.9 % 100 mL IVPB     1.5 g 200 mL/hr over 30 Minutes Intravenous To Surgery 03/10/18 1919 03/11/18 1225   03/11/18 0400  cefUROXime (ZINACEF) 750 mg in sodium chloride 0.9 % 100 mL IVPB  Status:  Discontinued     750 mg 200 mL/hr over 30 Minutes Intravenous To Surgery 03/10/18 1917 03/11/18 1140   03/10/18 1800  cefTRIAXone (ROCEPHIN) 2 g in sodium chloride 0.9 % 100 mL IVPB     2 g 200 mL/hr over 30 Minutes Intravenous Every 24 hours 03/09/18 1756     03/10/18 0400  vancomycin (VANCOCIN) IVPB 750 mg/150 ml premix     750 mg 150 mL/hr over 60 Minutes Intravenous Every 8 hours 03/09/18 1719     03/09/18 1800  cefTRIAXone (ROCEPHIN) 1 g in sodium chloride 0.9 % 100 mL IVPB     1 g 200 mL/hr over 30 Minutes Intravenous  Once 03/09/18 1756 03/09/18 1925   03/09/18 1730  vancomycin (VANCOCIN) 1,500 mg in sodium chloride 0.9 % 500 mL IVPB     1,500 mg 250 mL/hr over 120 Minutes Intravenous  Once 03/09/18 1717 03/09/18 2303   03/09/18 1330  cefTRIAXone (ROCEPHIN) 1 g in sodium chloride 0.9 % 100 mL IVPB     1 g 200 mL/hr over 30 Minutes Intravenous  Once 03/09/18 1318 03/09/18 1508   03/09/18 1330  azithromycin (ZITHROMAX) 500 mg in sodium chloride 0.9 % 250 mL IVPB     500 mg 250 mL/hr over 60 Minutes Intravenous  Once 03/09/18 1318 03/09/18 1634      Medications: Scheduled Meds: . acetaminophen  1,000 mg Oral Q6H   Or  . acetaminophen (TYLENOL) oral liquid 160 mg/5 mL  1,000 mg Per Tube Q6H  . acetaminophen (TYLENOL) oral liquid 160 mg/5 mL  650 mg Per Tube Once   Or  . acetaminophen  650 mg Rectal Once  . aspirin EC  325 mg Oral Daily   Or  . aspirin  324 mg Per Tube Daily  . bisacodyl  10 mg Oral Daily   Or  . bisacodyl  10 mg Rectal Daily  . chlorhexidine gluconate (MEDLINE KIT)  15 mL Mouth Rinse BID  . Chlorhexidine Gluconate Cloth  6 each Topical Daily  . docusate sodium  200 mg Oral Daily  . enoxaparin (LOVENOX) injection  40 mg Subcutaneous QHS  .  feeding supplement (PRO-STAT SUGAR FREE 64)  30 mL Per Tube BID  . feeding supplement (VITAL HIGH PROTEIN)  1,000 mL Per Tube Q24H  . furosemide  20 mg Intravenous BID  . insulin aspart  0-24 Units Subcutaneous Q4H  . mouth rinse  15 mL Mouth Rinse 10 times per day  . metoprolol tartrate  12.5 mg Oral BID   Or  . metoprolol tartrate  12.5 mg Per Tube BID  . pantoprazole  40 mg Oral Daily  . sodium chloride flush  10-40 mL Intracatheter Q12H  . sodium chloride flush  3 mL Intravenous Q12H   Continuous Infusions: . sodium chloride 10 mL/hr at 03/13/18 1100  . sodium chloride    . sodium chloride Stopped (03/12/18 0900)  . cefTRIAXone (ROCEPHIN)  IV Stopped (03/12/18 1750)  . dexmedetomidine (PRECEDEX) IV infusion 0.7 mcg/kg/hr (03/13/18 1125)  . DOBUTamine 2 mcg/kg/min (03/13/18 1100)  . lactated ringers    . lactated ringers    . lactated ringers 10 mL/hr at 03/13/18 1100  . nitroGLYCERIN Stopped (03/11/18 1228)  . vancomycin Stopped  (03/13/18 1200)   PRN Meds:.sodium chloride, lactated ringers, metoprolol tartrate, midazolam, morphine injection, ondansetron (ZOFRAN) IV, sodium chloride flush, sodium chloride flush, traMADol    Objective: Weight change: 15 lb 10.4 oz (7.1 kg)  Intake/Output Summary (Last 24 hours) at 03/13/2018 1202 Last data filed at 03/13/2018 1100 Gross per 24 hour  Intake 2374.18 ml  Output 1220 ml  Net 1154.18 ml   Blood pressure (!) 86/65, pulse 79, temperature 99.7 F (37.6 C), resp. rate (!) 29, height _0  (1.6 m), weight 176 lb 2.4 oz (79.9 kg), SpO2 97 %. Temp:  [96.1 F (35.6 C)-99.7 F (37.6 C)] 99.7 F (37.6 C) (04/10 1100) Pulse Rate:  [79-99] 79 (04/10 1100) Resp:  [12-37] 29 (04/10 1100) BP: (79-122)/(56-87) 86/65 (04/10 1100) SpO2:  [90 %-100 %] 97 % (04/10 1100) Arterial Line BP: (89-137)/(39-73) 102/53 (04/10 0230) FiO2 (%):  [40 %-50 %] 50 % (04/10 0915) Weight:  [176 lb 2.4 oz (79.9 kg)] 176 lb 2.4 oz (79.9 kg) (04/10 0500)  Physical Exam: General:intubated he is a bit agitated and trying to get at the endotracheal tube. HEENT: anicteric sclera, EOMI CVS regular rate, normal r, no murmurs heard Chest: Fairly clear anteriorly Abdomen: soft  nondistended,  Extremities: no  clubbing or edema noted bilaterally Skin: no rashes, bandage intact Neuro: nonfocal  CBC:  CBC Latest Ref Rng & Units 03/13/2018 03/12/2018 03/12/2018  WBC 4.0 - 10.5 K/uL 11.0(H) - 12.1(H)  Hemoglobin 13.0 - 17.0 g/dL 8.9(L) 9.5(L) 9.3(L)  Hematocrit 39.0 - 52.0 % 28.0(L) 28.0(L) 28.8(L)  Platelets 150 - 400 K/uL 134(L) - 135(L)     BMET Recent Labs    03/12/18 0352  03/12/18 1635 03/13/18 0341  NA 135   < > 138 135  K 3.6   < > 3.4* 3.8  CL 103  --  98* 100*  CO2 25  --   --  26  GLUCOSE 94   < > 115* 104*  BUN 11  --  8 10  CREATININE 0.69   < > 0.50* 0.65  CALCIUM 7.4*  --   --  7.5*   < > = values in this interval not displayed.     Liver Panel  Recent Labs     03/11/18 0304  PROT 6.8  ALBUMIN 1.6*  AST 44*  ALT 79*  ALKPHOS 129*  BILITOT 1.5*  BILIDIR 0.8*  IBILI 0.7       Sedimentation Rate No results for input(s): ESRSEDRATE in the last 72 hours. C-Reactive Protein No results for input(s): CRP in the last 72 hours.  Micro Results: Recent Results (from the past 720 hour(s))  Respiratory Panel by PCR     Status: None   Collection Time: 03/09/18  6:04 PM  Result Value Ref Range Status   Adenovirus NOT DETECTED NOT DETECTED Final   Coronavirus 229E NOT DETECTED NOT DETECTED Final   Coronavirus HKU1 NOT DETECTED NOT DETECTED Final   Coronavirus NL63 NOT DETECTED NOT DETECTED Final   Coronavirus OC43 NOT DETECTED NOT DETECTED Final   Metapneumovirus NOT DETECTED NOT DETECTED Final   Rhinovirus / Enterovirus NOT DETECTED NOT DETECTED Final   Influenza A NOT DETECTED NOT DETECTED Final   Influenza B NOT DETECTED NOT DETECTED Final   Parainfluenza Virus 1 NOT DETECTED NOT DETECTED Final   Parainfluenza Virus 2 NOT DETECTED NOT DETECTED Final   Parainfluenza Virus 3 NOT DETECTED NOT DETECTED Final   Parainfluenza Virus 4 NOT DETECTED NOT DETECTED Final   Respiratory Syncytial Virus NOT DETECTED NOT DETECTED Final   Bordetella pertussis NOT DETECTED NOT DETECTED Final   Chlamydophila pneumoniae NOT DETECTED NOT DETECTED Final   Mycoplasma pneumoniae NOT DETECTED NOT DETECTED Final    Comment: Performed at [facility], [address], [place], [place] [address]  MRSA PCR Screening     Status: None   Collection Time: 03/09/18  6:04 PM  Result Value Ref Range Status   MRSA by PCR NEGATIVE NEGATIVE Final    Comment:        The GeneXpert MRSA Assay (FDA approved for NASAL specimens only), is one component of a comprehensive MRSA colonization surveillance program. It is not intended to diagnose MRSA infection nor to guide or monitor treatment for MRSA infections. Performed at [facility], [place] [address],  [place], [place] [address]   Culture, blood (routine x 2)     Status: None (Preliminary result)   Collection Time: 03/09/18  6:41 PM  Result Value Ref Range Status   Specimen Description BLOOD LEFT A-LINE  Final   Special Requests   Final    BOTTLES DRAWN AEROBIC AND ANAEROBIC Blood Culture adequate volume   Culture   Final    NO GROWTH 3 DAYS Performed at [facility], [address] [place], [place] [address]    Report Status PENDING  Incomplete  Culture, blood (routine x 2)     Status: None (Preliminary result)   Collection Time: 03/09/18  6:42 PM  Result Value Ref Range Status   Specimen Description BLOOD LEFT ANTECUBITAL  Final   Special Requests   Final    BOTTLES DRAWN AEROBIC AND ANAEROBIC Blood Culture adequate volume   Culture   Final    NO GROWTH 3 DAYS Performed at [facility], [place] [address] [place], [place] [address]    Report Status PENDING  Incomplete  Culture, blood (single)     Status: None (Preliminary result)   Collection Time: 03/09/18  6:52 PM  Result Value Ref Range Status   Specimen Description BLOOD LEFT HAND  Final   Special Requests   Final    BOTTLES DRAWN AEROBIC AND ANAEROBIC Blood Culture adequate volume   Culture   Final    NO GROWTH 3 DAYS Performed at [facility], [place] [address] [place], [place] [address]    Report  Status PENDING  Incomplete  Surgical PCR screen     Status: None   Collection Time: 03/10/18  6:56 PM  Result Value Ref Range Status   MRSA, PCR NEGATIVE NEGATIVE Final   Staphylococcus aureus NEGATIVE NEGATIVE Final    Comment: (NOTE) The Xpert SA Assay (FDA approved for NASAL specimens in patients 27 years of age and older), is one component of a comprehensive surveillance program. It is not intended to diagnose infection nor to guide or monitor treatment. Performed at [facility], [place] [address], [place], [place] [address]   Aerobic/Anaerobic Culture (surgical/deep wound)     Status: None  (Preliminary result)   Collection Time: 03/11/18  9:22 AM  Result Value Ref Range Status   Specimen Description WOUND  Final   Special Requests AORTIC VALVE  Final   Gram Stain   Final    RARE WBC PRESENT, PREDOMINANTLY MONONUCLEAR NO ORGANISMS SEEN    Culture   Final    NO GROWTH 2 DAYS Performed at [place] [address], [place], [place] [address]    Report Status PENDING  Incomplete  Acid Fast Smear (AFB)     Status: None   Collection Time: 03/11/18  9:22 AM  Result Value Ref Range Status   AFB Specimen Processing Concentration  Final   Acid Fast Smear Negative  Final    Comment: (NOTE) Performed At: [facility] [place], [place] [id number] Rush Farmer MD LZ:[id number]    Source (AFB) AORTIC VALVE  Final    Comment: Performed at [facility], [place] [address] [place], [place] [address]    Studies/Results: Dg Chest Port 1 View  Result Date: 03/13/2018 CLINICAL DATA:  Respiratory failure. Status post AVR. History of CHF. ETT present. EXAM: PORTABLE CHEST 1 VIEW COMPARISON:  Chest x-rays dated 03/12/2018 and 03/11/2018. FINDINGS: Endotracheal tube remains well positioned with tip approximately 2 cm above the carina. Enteric tube passes below the diaphragm. Swan-Ganz catheter tip in the midline. RIGHT IJ central line stable in position with tip at the level of the upper/mid SVC. Patchy bilateral airspace opacities again noted, again most dense in the lower lung zones bilaterally, pneumonia versus pulmonary edema. No pleural effusion or pneumothorax seen. IMPRESSION: 1. Support apparatus is stable in position, as detailed above. 2. Persistent patchy bilateral airspace opacities, pneumonia versus pulmonary edema, distribution favors edema. Electronically Signed   By: Franki Cabot M.D.   On: 03/13/2018 08:33   Dg Chest Port 1 View  Result Date: 03/12/2018 CLINICAL DATA:  Hypoxia EXAM: PORTABLE CHEST 1 VIEW COMPARISON:  March 11, 2018 FINDINGS:  Endotracheal tube tip is 2.8 cm above the carina. Central catheter tip is in the superior vena cava. Nasogastric tube tip and side port are below the diaphragm. There are stable mediastinal drains. Temporary pacemaker wires are attached to the right heart. No pneumothorax. There is widespread interstitial and alveolar edema, stable. No new opacity evident. Heart is prominent with pulmonary vascularity within normal limits, stable. No adenopathy. No bone lesions. IMPRESSION: Tube and catheter positions as described without pneumothorax. Interstitial and alveolar edema persist without change. No new opacity evident. Stable cardiac silhouette. Electronically Signed   By: Lowella Grip III M.D.   On: 03/12/2018 07:28   Dg Chest Port 1 View  Result Date: 03/11/2018 CLINICAL DATA:  Hypoxia EXAM: PORTABLE CHEST 1 VIEW COMPARISON:  March 11, 2018 study obtained earlier in the day FINDINGS: Endotracheal tube tip is 1.6 cm  above the carina. Central catheter tip is in superior vena cava. There are bilateral chest tubes. Nasogastric tube tip and side port are below the diaphragm. There are temporary pacemaker leads attached to the right heart. No pneumothorax. There is patchy interstitial and alveolar edema bilaterally with somewhat less consolidation in the left lower lobe compared to earlier in the day. No new opacity. Heart size and pulmonary vascularity are normal. No adenopathy. IMPRESSION: Tube and catheter positions as described without pneumothorax. Note that the endotracheal tube tip is close to the carina and may wish to be withdrawn 2-3 cm. Extensive interstitial and patchy alveolar opacity, likely edema, somewhat less on the left than on the study obtained earlier in the day. Changes on the right are similar. Electronically Signed   By: Lowella Grip III M.D.   On: 03/11/2018 12:12   Dg Abd Portable 1v  Result Date: 03/13/2018 CLINICAL DATA:  OG-tube placement. EXAM: PORTABLE ABDOMEN - 1 VIEW  COMPARISON:  Radiograph 03/10/2018 FINDINGS: Tip and side port of the enteric tube below the diaphragm in the stomach. No bowel dilatation to suggest obstruction. No evidence of free air. Patchy airspace disease at the lung bases again seen. IMPRESSION: Tip and side port of the enteric tube below the diaphragm in the stomach. Electronically Signed   By: Jeb Levering M.D.   On: 03/13/2018 03:35      Assessment/Plan:  INTERVAL HISTORY: Cultures from blood and heart valve tissue to new to be unrevealing. Principal Problem:   Aortic valve endocarditis Active Problems:   Acute respiratory failure with hypoxia (HCC)   Acute decompensated heart failure (HCC)   S/P AVR   Acute pulmonary edema ([place])    John Byrd is a 57 y.o. 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 Roxan Hockey with AV vegetation sent to micro and path, sp AVR  #1 AV endocarditis: continue vancomycin, ceftriaxone  I have submitted paperwork to Pathology dept to have valve sent to [facility] for PCR broad ranging bacterial, fungal, AFB as well as for specifically Bartonella, Legionella, T. Whippleii   they confirmed to have appropriate specimen to send this usually the turnaround is about 2 weeks time.  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  Culture Result: Pending No Known Allergies  OPAT Orders Discharge antibiotics: Ceftriaxone 2 g IV daily 24 hours and vancomycin Per pharmacy protocol vancomycin Aim for Vancomycin trough 15-20 (unless otherwise indicated) Duration: 6 weeks End Date:  May 19th, 2019  [facility] Care Per Protocol: BIWEEKLY LABS   _x_ BMP w GFR  Labs weekly while on IV antibiotics: _x_ CBC with differential  _x_ Vancomycin  trough  _x_ Please pull PIC at completion of IV antibiotics __ Please leave PIC in place until doctor has seen patient or been notified  Fax weekly labs to [phone]  Clinic Follow Up Appt:  Next 3-4 weeks with [place]   LOS: 4 days   Alcide Evener 03/13/2018, 12:02 PM

## 2018-03-13 NOTE — Progress Notes (Signed)
Patient ID: John KrebsValentino Santiago Byrd, male   DOB: [birth year], 57 y.o.   MRN: [id number] EVENING ROUNDS NOTE :     [address]       [place] [id number]             [phone]                 2 Days Post-Op Procedure(s) (LRB): AORTIC VALVE REPLACEMENT (AVR) USING 21 MM MAGNA EASE PERICARDIAL BIOPROSTHESIS-AORTIC, MODEL [id number], SERIAL # [id number] (N/A) VIDEO BRONCHOSCOPY (N/A)  Total Length of Stay:  LOS: 4 days  BP (!) 89/67   Pulse 80   Temp 98.7 F (37.1 C) (Oral)   Resp (!) 27   Ht 5\' 3"  (1.6 m)   Wt 176 lb 2.4 oz (79.9 kg)   SpO2 96%   BMI 31.20 kg/m   .Intake/Output      04/09 0701 - 04/10 0700 04/10 0701 - 04/11 0700   I.V. (mL/kg) 1221.7 (15.3) 470.6 (5.9)   Blood 423.7    NG/GT 30 313   IV Piggyback 750 450   Total Intake(mL/kg) 2425.4 (30.4) 1233.6 (15.4)   Urine (mL/kg/hr) 2770 (1.4) 850 (0.9)   Emesis/NG output  500   Blood     Chest Tube 30    Total Output 2800 1350   Net -374.6 -116.4          . sodium chloride 10 mL/hr at 03/13/18 1800  . sodium chloride    . sodium chloride Stopped (03/12/18 0900)  . cefTRIAXone (ROCEPHIN)  IV Stopped (03/13/18 1801)  . dexmedetomidine (PRECEDEX) IV infusion 0.7 mcg/kg/hr (03/13/18 1800)  . DOBUTamine Stopped (03/13/18 1300)  . feeding supplement (VITAL AF 1.2 CAL) 60 mL/hr at 03/13/18 1800  . lactated ringers    . lactated ringers    . lactated ringers 10 mL/hr at 03/13/18 1800  . nitroGLYCERIN Stopped (03/11/18 1228)  . phenylephrine (NEO-SYNEPHRINE) Adult infusion 10 mcg/min (03/13/18 1800)  . vancomycin Stopped (03/13/18 1200)     Lab Results  Component Value Date   WBC 11.0 (H) 03/13/2018   HGB 8.9 (L) 03/13/2018   HCT 28.0 (L) 03/13/2018   PLT 134 (L) 03/13/2018   GLUCOSE 104 (H) 03/13/2018   TRIG 74 03/10/2018   ALT 79 (H) 03/11/2018   AST 44 (H) 03/11/2018   NA 135 03/13/2018   K 3.8 03/13/2018   CL 100 (L) 03/13/2018   CREATININE 0.65 03/13/2018   BUN 10 03/13/2018   CO2 26 03/13/2018   INR 1.78 03/11/2018   HGBA1C 5.8 (H) 03/09/2018   Remains on vent Opens eyes and moves all extremities    Delight OvensEdward B Trevon Strothers MD  [place] [phone] Office [phone] 03/13/2018 6:27 PM

## 2018-03-13 NOTE — Progress Notes (Signed)
PULMONARY / CRITICAL CARE MEDICINE   Name: John Byrd MRN: [id number] DOB: September 03, 1961    ADMISSION DATE:  03/09/2018 CONSULTATION DATE:  4/6/419  REFERRING MD:  Enedina FinnerStepheen Kohut  CHIEF COMPLAINT:  dyspnea  HISTORY OF PRESENT ILLNESS:   57 year old male nonsmoker with no known past medical history who presents 4/6 with 3 weeks of progressive dyspnea, lower extremity edema and nonproductive cough.  He had a chest x-ray performed 1 week prior to admit for this complaint which showed multifocal airspace opacities however he has not followed up for additional care for this.  He denies fevers, night sweats, weight loss, chest pain, nausea, vomiting, diarrhea, myalgias, sick contacts.  No sore throat.  Did report that he was having occasional chills a couple of months ago that improved with taking over-the-counter medicine.  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  He is from [place] though his girlfriend lives here.    In the emergency department he was noted to be acutely tachypneic, oxygen saturation in the 80s on room air which did not improve with a nonrebreather and so he was started on BiPAP and given a dose of Lasix, ceftriaxone and azithromycin.    4/8- patient found to have significant aortic regurgitation and large embolus. Did not improve with conservative treatment. Currently s/p aortic valve replacement    SUBJECTIVE:  Drowsy, weaning but needing PS 10/5, 50% FiO2  Diuresing  Weaning dobutamine    VITAL SIGNS: BP (!) 82/56   Pulse 80   Temp 99.3 F (37.4 C)   Resp (!) 26   Ht 5\' 3"  (1.6 m)   Wt 79.9 kg (176 lb 2.4 oz)   SpO2 95%   BMI 31.20 kg/m   HEMODYNAMICS: PAP: (26-42)/(10-25) 36/25 CO:  [5.1 L/min-7.3 L/min] 7.3 L/min CI:  [2.9 L/min/m2-4.2 L/min/m2] 4.2 L/min/m2  VENTILATOR SETTINGS: Vent Mode: PSV;CPAP FiO2 (%):  [40 %-50 %] 50 % Set Rate:  [20 bmp] 20 bmp Vt Set:   [460 mL] 460 mL PEEP:  [5 cmH20] 5 cmH20 Pressure Support:  [10 cmH20] 10 cmH20 Plateau Pressure:  [22 cmH20] 22 cmH20  INTAKE / OUTPUT: I/O last 3 completed shifts: In: 4149 [I.V.:2435.3; Blood:423.7; NG/GT:90; IV Piggyback:1200] Out: 3485 [Urine:3405; Chest Tube:80]  PHYSICAL EXAMINATION: General: wdwn male, NAD in bed  Neuro: sedated on precedex, RASS -1, opens eyes, follows some simple commands HEENT: ETT, mm moist  Cardiovascular: S1-S2, + M, sternal dressing c/d  Lungs: resps even non labored on PS 10/5, coarse  Abdomen: Round, soft, hypoactive bowel sounds Musculoskeletal: Warm and dry, 1-2+ BLE edema   LABS:  BMET Recent Labs  Lab 03/11/18 0304  03/12/18 0352  03/12/18 0937 03/12/18 1629 03/12/18 1635 03/13/18 0341  NA 133*   < > 135  --  139  --  138 135  K 3.7   < > 3.6  --  3.6  --  3.4* 3.8  CL 96*   < > 103  --   --   --  98* 100*  CO2 27  --  25  --   --   --   --  26  BUN 13   < > 11  --   --   --  8 10  CREATININE 0.90   < > 0.69   < >  --  0.69 0.50* 0.65  GLUCOSE 125*   < > 94  --  117*  --  115* 104*   < > = values in this interval not displayed.    Electrolytes Recent Labs  Lab 03/10/18 0456 03/10/18 1700 03/11/18 0304 03/11/18 1750 03/12/18 0352 03/12/18 1629 03/13/18 0341  CALCIUM 7.2* 7.4* 7.3*  --  7.4*  --  7.5*  MG 2.2 2.1 2.1 3.5* 2.6* 2.2  --   PHOS 4.2 3.9 3.5  --   --   --   --     CBC Recent Labs  Lab 03/12/18 0755  03/12/18 1629 03/12/18 1635 03/13/18 0341  WBC 12.0*  --  12.1*  --  11.0*  HGB 6.8*   < > 9.3* 9.5* 8.9*  HCT 21.2*   < > 28.8* 28.0* 28.0*  PLT 144*  --  135*  --  134*   < > = values in this interval not displayed.    Coag's Recent Labs  Lab 03/10/18 1854 03/11/18 1208  APTT 34 36  INR 1.28 1.78    Sepsis Markers Recent Labs  Lab 03/09/18 1849 03/10/18 0928 03/11/18 0304  LATICACIDVEN 1.1  --   --   PROCALCITON 0.29 1.01 0.84    ABG Recent Labs  Lab 03/11/18 1201 03/11/18 1722  03/12/18 1303  PHART 7.371 7.329* 7.481*  PCO2ART 49.8* 52.3* 32.8  PO2ART 86.0 77.0* 53.0*    Liver Enzymes Recent Labs  Lab 03/09/18 1108 03/09/18 1849 03/11/18 0304  AST 74* 84* 44*  ALT 95* 94* 79*  ALKPHOS 151* 147* 129*  BILITOT 1.8* 1.7* 1.5*  ALBUMIN 1.9* 1.8* 1.6*    Cardiac Enzymes No results for input(s): TROPONINI, PROBNP in the last 168 hours.  Glucose Recent Labs  Lab 03/12/18 1241 03/12/18 1633 03/12/18 2013 03/12/18 2350 03/13/18 0336 03/13/18 0826  GLUCAP 118* 107* 106* 96 92 87    Imaging Dg Chest Port 1 View  Result Date: 03/13/2018 CLINICAL DATA:  Respiratory failure. Status post AVR. History of CHF. ETT present. EXAM: PORTABLE CHEST 1 VIEW COMPARISON:  Chest x-rays dated 03/12/2018 and 03/11/2018. FINDINGS: Endotracheal tube remains well positioned with tip approximately 2 cm above the carina. Enteric tube passes below the diaphragm. Swan-Ganz catheter tip in the midline. RIGHT IJ central line stable in position with tip at the level of the upper/mid SVC. Patchy bilateral airspace opacities again noted, again most dense in the lower lung zones bilaterally, pneumonia versus pulmonary edema. No pleural effusion or pneumothorax seen. IMPRESSION: 1. Support apparatus is stable in position, as detailed above. 2. Persistent patchy bilateral airspace opacities, pneumonia versus pulmonary edema, distribution favors edema. Electronically Signed   By: Bary Richard M.D.   On: 03/13/2018 08:33   Dg Abd Portable 1v  Result Date: 03/13/2018 CLINICAL DATA:  OG-tube placement. EXAM: PORTABLE ABDOMEN - 1 VIEW COMPARISON:  Radiograph 03/10/2018 FINDINGS: Tip and side port of the enteric tube below the diaphragm in the stomach. No bowel dilatation to suggest obstruction. No evidence of free air. Patchy airspace disease at the lung bases again seen. IMPRESSION: Tip and side port of the enteric tube below the diaphragm in the stomach. Electronically Signed   By: Rubye Oaks M.D.   On: 03/13/2018 03:35     STUDIES:  2D echo 4/6>>> severe AI, aortic valve vegetation   CULTURES: BC x 2 4/6>>> BC x 2 4/7>>> AFB 4/8>>Negative x 1 Surgical Culture Aortic valve 4/8>>  Fungal aortic valve leaflets 4/8 RVP 4/6>>> NEG   ANTIBIOTICS: vanc 4/6>>> Rocephin 4/6>>> azithro 4/6>>>   SIGNIFICANT EVENTS: 4/9 Aortic Valve  Replacement  LINES/TUBES: ETT 4/6>>> R IJ CVL 4/6>>>   DISCUSSION: 57 year old male with no known history admitted 4/7 with acute respiratory failure, found to have acute diastolic CHF due to aortic insufficiency in the setting of subacute bacterial endocarditis.  ASSESSMENT / PLAN:  PULMONARY Ventilator dependent respiratory failure secondary to Acute hypoxemic respiratory failure secondary to severe aortic valve regurgitation  Tolerating CPAP/PS 4/9 after lasix P:   Vent support - 8cc/kg  Wean FiO2 as able SBT - ok for PS wean as tol - not ready for extubation today  CXR/ABG prn Diuresis as BP and scr tol   CARDIOVASCULAR Acute diastolic heart failure secondary to aortic valve regurgitation secondary to valve endocarditis s/p bioprosthetic valve replacement  Aortic insufficiency Subacute bacterial endocarditis-aortic valve Shock-cardiogenic P:  Weaning  inotropes  Cardiology and CTS following Tele monitoring MAP goal > 65 EKG prn Diuresis as above per cards   RENAL Hyponatremia- resolved  P:   Follow BMET daily  and Urine output Replace electrolytes as needed Strict I/O  GASTROINTESTINAL Elevated LFTs-likely secondary to hepatic congestion - resolved  P:   Will add TF 4/10 Famotidine PPX  HEMATOLOGIC Anemia P:  Trend CBC Transfuse PRBC's  per TCTS lovenox for DVT proph   INFECTIOUS Subacute bacterial endocarditis.  No known indwelling hardware or history of IV drug abuse. T max 100.9 P:   Cultures pending-negative to date ID following  Trend pct  Continue antibiotics as above  Trend fever/  WBC Re- Culture as is clinically indicated  ENDOCRINE No active issue P:   Monitor glucose on chemistries  NEUROLOGIC Sedation needs on vent P:   RASS goal: -1 Precedex gtt Daily wake up assessment Minimize sedation as able    FAMILY  No family at bedside 4/10  - Inter-disciplinary family meet or Palliative Care meeting due by:  Day 7  Discussed with pharmacy, RN   Dirk Dress, NP 03/13/2018  9:58 AM Pager: [phone] or [phone]

## 2018-03-13 NOTE — Progress Notes (Signed)
We are still working to get Advanced Directive taken care of for this patient.  Patient would like his girlfriend to be his Health Care Power of [phone].  Girlfriend not present currently.  Patient not able at this time to sign paperwork.  Will continue to follow as needed to take care of Advanced Directive for him.

## 2018-03-13 NOTE — Progress Notes (Signed)
Nutrition Follow-up  DOCUMENTATION CODES:   Not applicable  INTERVENTION:   Tube Feeding:  Vital AF 1.2 @ 60 ml/hr Pro-Stat 30 mL daily Provides 1828 kcals, 123 g of protein and 1166 mL of free water Meets 100% estimated calorie and protein needs   NUTRITION DIAGNOSIS:   Inadequate oral intake related to acute illness as evidenced by NPO status.  GOAL:   Patient will meet greater than or equal to 90% of their needs  MONITOR:   TF tolerance, Labs, Vent status, Weight trends  REASON FOR ASSESSMENT:   Consult Enteral/tube feeding initiation and management  ASSESSMENT:    57 yo male with no known PMH presents with 3 weeks of progressive dyspnea, LEE and cough with acute respiratory failure with acute CHF due to aortic insufficiency in setting of subacute bacterial endocarditis   4/7 TTE: severe AI, aortic valve vegetation 4/8 Aortic valve replacement  Patient is currently intubated on ventilator support, currently on dobutamine, off levophed and vasopressin MV: 13.9 L/min Temp (24hrs), Avg:98.4 F (36.9 C), Min:96.8 F (36 C), Max:99.7 F (37.6 C)  OG tube with tip in stomach  Weight up overall since admission; weight had trending down prior to surgery on 4/8 but weight up post-op. Plan to utilize EDW of 69 kg (BMI 27) for nutritional needs  Labs: reviewed Meds: dobutamine, precedex   NUTRITION - FOCUSED PHYSICAL EXAM:    Most Recent Value  Orbital Region  No depletion  Upper Arm Region  No depletion  Thoracic and Lumbar Region  No depletion  Buccal Region  No depletion  Temple Region  No depletion  Clavicle Bone Region  No depletion  Clavicle and Acromion Bone Region  No depletion  Scapular Bone Region  No depletion  Dorsal Hand  No depletion  Patellar Region  No depletion  Anterior Thigh Region  No depletion  Posterior Calf Region  No depletion  Edema (RD Assessment)  None       Diet Order:  Diet NPO time specified  EDUCATION NEEDS:   Not  appropriate for education at this time  Skin:  Skin Assessment: Reviewed RN Assessment  Last BM:  no documented BM  Height:   Ht Readings from Last 1 Encounters:  03/11/18 5\' 3"  (1.6 m)    Weight:   Wt Readings from Last 1 Encounters:  03/13/18 176 lb 2.4 oz (79.9 kg)    Ideal Body Weight:     BMI:  Body mass index is 31.2 kg/m.  Estimated Nutritional Needs:   Kcal:  1822  Protein:  104-138 g  Fluid:  >/= 1.8 L   Romelle Starcherate Khriz Liddy MS, RD, LDN, CNSC [phone]) [phone] Pager  [phone] Weekend/On-Call Pager

## 2018-03-13 NOTE — Progress Notes (Signed)
2 Days Post-Op Procedure(s) (LRB): AORTIC VALVE REPLACEMENT (AVR) USING 21 MM MAGNA EASE PERICARDIAL BIOPROSTHESIS-AORTIC, MODEL 3300TFX, SERIAL # [id number] (N/A) VIDEO BRONCHOSCOPY (N/A) Subjective: Intubated. Sedated but responsive Does have some pain  Objective: Vital signs in last 24 hours: Temp:  [96.1 F (35.6 C)-99.1 F (37.3 C)] 99.1 F (37.3 C) (04/10 0800) Pulse Rate:  [79-99] 80 (04/10 0800) Cardiac Rhythm: Atrial paced (04/10 0800) Resp:  [12-37] 25 (04/10 0800) BP: (79-122)/(59-87) 94/73 (04/10 0800) SpO2:  [90 %-100 %] 97 % (04/10 0800) Arterial Line BP: (89-137)/(39-73) 102/53 (04/10 0230) FiO2 (%):  [40 %-50 %] 50 % (04/10 0314) Weight:  [176 lb 2.4 oz (79.9 kg)] 176 lb 2.4 oz (79.9 kg) (04/10 0500)  Hemodynamic parameters for last 24 hours: PAP: (26-42)/(10-19) 33/12 CO:  [5.1 L/min-7.3 L/min] 7.3 L/min CI:  [2.9 L/min/m2-4.2 L/min/m2] 4.2 L/min/m2  Intake/Output from previous day: 04/09 0701 - 04/10 0700 In: 2425.4 [I.V.:1221.7; Blood:423.7; NG/GT:30; IV Piggyback:750] Out: 2800 [Urine:2770; Chest Tube:30] Intake/Output this shift: No intake/output data recorded.  General appearance: cooperative and no distress Neurologic: intact Heart: regular rate and rhythm Lungs: clear anteriorly Abdomen: normal findings: soft, non-tender  Lab Results: Recent Labs    03/12/18 1629 03/12/18 1635 03/13/18 0341  WBC 12.1*  --  11.0*  HGB 9.3* 9.5* 8.9*  HCT 28.8* 28.0* 28.0*  PLT 135*  --  134*   BMET:  Recent Labs    03/12/18 0352  03/12/18 1635 03/13/18 0341  NA 135   < > 138 135  K 3.6   < > 3.4* 3.8  CL 103  --  98* 100*  CO2 25  --   --  26  GLUCOSE 94   < > 115* 104*  BUN 11  --  8 10  CREATININE 0.69   < > 0.50* 0.65  CALCIUM 7.4*  --   --  7.5*   < > = values in this interval not displayed.    PT/INR:  Recent Labs    03/11/18 1208  LABPROT 20.5*  INR 1.78   ABG    Component Value Date/Time   PHART 7.481 (H) 03/12/2018 1303   HCO3  24.7 03/12/2018 1303   TCO2 26 03/12/2018 1635   O2SAT 91.0 03/12/2018 1303   CBG (last 3)  Recent Labs    03/12/18 2013 03/12/18 2350 03/13/18 0336  GLUCAP 106* 96 92    Assessment/Plan: S/P Procedure(s) (LRB): AORTIC VALVE REPLACEMENT (AVR) USING 21 MM MAGNA EASE PERICARDIAL BIOPROSTHESIS-AORTIC, MODEL 3300TFX, SERIAL # [id number] (N/A) VIDEO BRONCHOSCOPY (N/A) -  CV- s/p AVR for AI due to endocarditis. Cultures negative so far  Good hemodynamics- will wean dobutamine  ID- on Vanco and ceftriaxone cultures negative so far  RESP- VDRF secondary to pulmonary edema- good oxygenation on 50% and 5 PEEP  Vent per CCM  CXR improved but still has significant edema, not sure he will be ready to extubate  RENAL_- creatinine and lytes OK  Volume overloaded- continue diuresis  ENDO- CBG well controlled, dc levemir, continue SSI  SCD + enoxaparin for DVT prophylaxis   LOS: 4 days    Loreli SlotSteven C Hanifa Antonetti 03/13/2018

## 2018-03-14 ENCOUNTER — Inpatient Hospital Stay (HOSPITAL_COMMUNITY): Payer: Self-pay

## 2018-03-14 DIAGNOSIS — E876 Hypokalemia: Secondary | ICD-10-CM

## 2018-03-14 LAB — COMPREHENSIVE METABOLIC PANEL
ALK PHOS: 138 U/L — AB (ref 38–126)
ALT: 35 U/L (ref 17–63)
AST: 29 U/L (ref 15–41)
Albumin: 1.9 g/dL — ABNORMAL LOW (ref 3.5–5.0)
Anion gap: 6 (ref 5–15)
BILIRUBIN TOTAL: 1.1 mg/dL (ref 0.3–1.2)
BUN: 12 mg/dL (ref 6–20)
CALCIUM: 7.5 mg/dL — AB (ref 8.9–10.3)
CO2: 25 mmol/L (ref 22–32)
CREATININE: 0.56 mg/dL — AB (ref 0.61–1.24)
Chloride: 105 mmol/L (ref 101–111)
Glucose, Bld: 126 mg/dL — ABNORMAL HIGH (ref 65–99)
Potassium: 3.7 mmol/L (ref 3.5–5.1)
Sodium: 136 mmol/L (ref 135–145)
Total Protein: 6 g/dL — ABNORMAL LOW (ref 6.5–8.1)

## 2018-03-14 LAB — MAGNESIUM
MAGNESIUM: 2 mg/dL (ref 1.7–2.4)
Magnesium: 2.1 mg/dL (ref 1.7–2.4)

## 2018-03-14 LAB — CBC
HCT: 27.8 % — ABNORMAL LOW (ref 39.0–52.0)
HEMOGLOBIN: 9 g/dL — AB (ref 13.0–17.0)
MCH: 28.9 pg (ref 26.0–34.0)
MCHC: 32.4 g/dL (ref 30.0–36.0)
MCV: 89.4 fL (ref 78.0–100.0)
Platelets: 170 10*3/uL (ref 150–400)
RBC: 3.11 MIL/uL — AB (ref 4.22–5.81)
RDW: 18.2 % — ABNORMAL HIGH (ref 11.5–15.5)
WBC: 9.6 10*3/uL (ref 4.0–10.5)

## 2018-03-14 LAB — CULTURE, BLOOD (ROUTINE X 2)
Culture: NO GROWTH
Culture: NO GROWTH
SPECIAL REQUESTS: ADEQUATE
SPECIAL REQUESTS: ADEQUATE

## 2018-03-14 LAB — GLUCOSE, CAPILLARY
GLUCOSE-CAPILLARY: 105 mg/dL — AB (ref 65–99)
GLUCOSE-CAPILLARY: 109 mg/dL — AB (ref 65–99)
GLUCOSE-CAPILLARY: 121 mg/dL — AB (ref 65–99)
GLUCOSE-CAPILLARY: 123 mg/dL — AB (ref 65–99)
GLUCOSE-CAPILLARY: 134 mg/dL — AB (ref 65–99)
Glucose-Capillary: 117 mg/dL — ABNORMAL HIGH (ref 65–99)

## 2018-03-14 LAB — PHOSPHORUS
PHOSPHORUS: 3.7 mg/dL (ref 2.5–4.6)
Phosphorus: 3.7 mg/dL (ref 2.5–4.6)

## 2018-03-14 LAB — CULTURE, BLOOD (SINGLE)
Culture: NO GROWTH
Special Requests: ADEQUATE

## 2018-03-14 LAB — COOXEMETRY PANEL
CARBOXYHEMOGLOBIN: 1.6 % — AB (ref 0.5–1.5)
Methemoglobin: 0.7 % (ref 0.0–1.5)
O2 SAT: 66.9 %
TOTAL HEMOGLOBIN: 9.3 g/dL — AB (ref 12.0–16.0)

## 2018-03-14 MED ORDER — FUROSEMIDE 10 MG/ML IJ SOLN
40.0000 mg | Freq: Once | INTRAMUSCULAR | Status: AC
  Administered 2018-03-14: 40 mg via INTRAVENOUS
  Filled 2018-03-14: qty 4

## 2018-03-14 MED ORDER — POTASSIUM CHLORIDE 20 MEQ/15ML (10%) PO SOLN
40.0000 meq | Freq: Three times a day (TID) | ORAL | Status: AC
  Administered 2018-03-14 (×2): 40 meq
  Filled 2018-03-14 (×2): qty 30

## 2018-03-14 MED ORDER — POTASSIUM CHLORIDE 10 MEQ/50ML IV SOLN
10.0000 meq | INTRAVENOUS | Status: AC | PRN
  Administered 2018-03-14 – 2018-03-16 (×3): 10 meq via INTRAVENOUS
  Filled 2018-03-14 (×3): qty 50

## 2018-03-14 MED ORDER — PANTOPRAZOLE SODIUM 40 MG PO PACK
40.0000 mg | PACK | Freq: Every day | ORAL | Status: DC
  Administered 2018-03-14 – 2018-03-16 (×3): 40 mg
  Filled 2018-03-14 (×5): qty 20

## 2018-03-14 MED ORDER — FUROSEMIDE 10 MG/ML IJ SOLN
20.0000 mg | Freq: Once | INTRAMUSCULAR | Status: AC
  Administered 2018-03-14: 20 mg via INTRAVENOUS
  Filled 2018-03-14: qty 2

## 2018-03-14 NOTE — Progress Notes (Signed)
Subjective: Intubated restrained but more calm   Antibiotics:  Anti-infectives (From admission, onward)   Start     Dose/Rate Route Frequency Ordered Stop   03/11/18 1830  vancomycin (VANCOCIN) IVPB 1000 mg/200 mL premix  Status:  Discontinued     1,000 mg 200 mL/hr over 60 Minutes Intravenous  Once 03/11/18 1141 03/11/18 1145   03/11/18 1430  ceFAZolin (ANCEF) IVPB 2g/100 mL premix  Status:  Discontinued     2 g 200 mL/hr over 30 Minutes Intravenous Every 8 hours 03/11/18 1141 03/11/18 2023   03/11/18 0815  vancomycin (VANCOCIN) 500 mg in sodium chloride 0.9 % 100 mL IVPB     500 mg 100 mL/hr over 60 Minutes Intravenous To Surgery 03/11/18 0806 03/11/18 1225   03/11/18 0400  vancomycin (VANCOCIN) 1,250 mg in sodium chloride 0.9 % 250 mL IVPB  Status:  Discontinued     1,250 mg 166.7 mL/hr over 90 Minutes Intravenous To Surgery 03/10/18 1919 03/11/18 1140   03/11/18 0400  cefUROXime (ZINACEF) 1.5 g in sodium chloride 0.9 % 100 mL IVPB     1.5 g 200 mL/hr over 30 Minutes Intravenous To Surgery 03/10/18 1919 03/11/18 1225   03/11/18 0400  cefUROXime (ZINACEF) 750 mg in sodium chloride 0.9 % 100 mL IVPB  Status:  Discontinued     750 mg 200 mL/hr over 30 Minutes Intravenous To Surgery 03/10/18 1917 03/11/18 1140   03/10/18 1800  cefTRIAXone (ROCEPHIN) 2 g in sodium chloride 0.9 % 100 mL IVPB     2 g 200 mL/hr over 30 Minutes Intravenous Every 24 hours 03/09/18 1756     03/10/18 0400  vancomycin (VANCOCIN) IVPB 750 mg/150 ml premix     750 mg 150 mL/hr over 60 Minutes Intravenous Every 8 hours 03/09/18 1719     03/09/18 1800  cefTRIAXone (ROCEPHIN) 1 g in sodium chloride 0.9 % 100 mL IVPB     1 g 200 mL/hr over 30 Minutes Intravenous  Once 03/09/18 1756 03/09/18 1925   03/09/18 1730  vancomycin (VANCOCIN) 1,500 mg in sodium chloride 0.9 % 500 mL IVPB     1,500 mg 250 mL/hr over 120 Minutes Intravenous  Once 03/09/18 1717 03/09/18 2303   03/09/18 1330  cefTRIAXone  (ROCEPHIN) 1 g in sodium chloride 0.9 % 100 mL IVPB     1 g 200 mL/hr over 30 Minutes Intravenous  Once 03/09/18 1318 03/09/18 1508   03/09/18 1330  azithromycin (ZITHROMAX) 500 mg in sodium chloride 0.9 % 250 mL IVPB     500 mg 250 mL/hr over 60 Minutes Intravenous  Once 03/09/18 1318 03/09/18 1634      Medications: Scheduled Meds: . acetaminophen  1,000 mg Oral Q6H   Or  . acetaminophen (TYLENOL) oral liquid 160 mg/5 mL  1,000 mg Per Tube Q6H  . acetaminophen (TYLENOL) oral liquid 160 mg/5 mL  650 mg Per Tube Once   Or  . acetaminophen  650 mg Rectal Once  . aspirin EC  325 mg Oral Daily   Or  . aspirin  324 mg Per Tube Daily  . bisacodyl  10 mg Oral Daily   Or  . bisacodyl  10 mg Rectal Daily  . chlorhexidine gluconate (MEDLINE KIT)  15 mL Mouth Rinse BID  . Chlorhexidine Gluconate Cloth  6 each Topical Daily  . docusate sodium  200 mg Oral Daily  . enoxaparin (LOVENOX) injection  40 mg Subcutaneous QHS  . feeding supplement (  PRO-STAT SUGAR FREE 64)  30 mL Per Tube Daily  . furosemide  20 mg Intravenous Once  . furosemide  40 mg Intravenous Once  . insulin aspart  0-24 Units Subcutaneous Q4H  . mouth rinse  15 mL Mouth Rinse 10 times per day  . metoprolol tartrate  12.5 mg Oral BID   Or  . metoprolol tartrate  12.5 mg Per Tube BID  . pantoprazole sodium  40 mg Per Tube Daily  . potassium chloride  40 mEq Per Tube TID  . sodium chloride flush  10-40 mL Intracatheter Q12H  . sodium chloride flush  3 mL Intravenous Q12H   Continuous Infusions: . sodium chloride 10 mL/hr at 03/14/18 1000  . sodium chloride    . sodium chloride Stopped (03/12/18 0900)  . cefTRIAXone (ROCEPHIN)  IV Stopped (03/13/18 1801)  . dexmedetomidine (PRECEDEX) IV infusion 0.7 mcg/kg/hr (03/14/18 1000)  . feeding supplement (VITAL AF 1.2 CAL) 60 mL/hr at 03/14/18 1000  . lactated ringers    . lactated ringers    . lactated ringers 10 mL/hr at 03/14/18 1000  . nitroGLYCERIN Stopped (03/11/18 1228)   . phenylephrine (NEO-SYNEPHRINE) Adult infusion 10 mcg/min (03/14/18 1000)  . potassium chloride Stopped (03/14/18 1004)  . vancomycin Stopped (03/14/18 0517)   PRN Meds:.sodium chloride, lactated ringers, metoprolol tartrate, midazolam, morphine injection, ondansetron (ZOFRAN) IV, potassium chloride, sodium chloride flush, sodium chloride flush, traMADol    Objective: Weight change: -16 lb 5 oz (-7.4 kg)  Intake/Output Summary (Last 24 hours) at 03/14/2018 1046 Last data filed at 03/14/2018 1000 Gross per 24 hour  Intake 3112.24 ml  Output 2000 ml  Net 1112.24 ml   Blood pressure 96/75, pulse 88, temperature 98.7 F (37.1 C), temperature source Oral, resp. rate (!) 28, height _0  (1.6 m), weight 159 lb 13.3 oz (72.5 kg), SpO2 97 %. Temp:  [98.2 F (36.8 C)-99.7 F (37.6 C)] 98.7 F (37.1 C) (04/11 0846) Pulse Rate:  [46-118] 88 (04/11 1000) Resp:  [18-38] 28 (04/11 1000) BP: (74-119)/(51-81) 96/75 (04/11 1000) SpO2:  [92 %-100 %] 97 % (04/11 1000) FiO2 (%):  [50 %] 50 % (04/11 0839) Weight:  [159 lb 13.3 oz (72.5 kg)] 159 lb 13.3 oz (72.5 kg) (04/11 0430)  Physical Exam: General:intubated. HEENT: anicteric sclera, EOMI CVS regular rate, normal r, click heard Chest: Fairly clear anteriorly Abdomen: soft  nondistended,  Extremities: no  clubbing or edema noted bilaterally Skin: no rashes, bandage intact Neuro: nonfocal  CBC:  CBC Latest Ref Rng & Units 03/14/2018 03/13/2018 03/12/2018  WBC 4.0 - 10.5 K/uL 9.6 11.0(H) -  Hemoglobin 13.0 - 17.0 g/dL 9.0(L) 8.9(L) 9.5(L)  Hematocrit 39.0 - 52.0 % 27.8(L) 28.0(L) 28.0(L)  Platelets 150 - 400 K/uL 170 134(L) -     BMET Recent Labs    03/13/18 0341 03/14/18 0445  NA 135 136  K 3.8 3.7  CL 100* 105  CO2 26 25  GLUCOSE 104* 126*  BUN 10 12  CREATININE 0.65 0.56*  CALCIUM 7.5* 7.5*     Liver Panel  Recent Labs    03/14/18 0445  PROT 6.0*  ALBUMIN 1.9*  AST 29  ALT 35  ALKPHOS 138*  BILITOT 1.1        Sedimentation Rate No results for input(s): ESRSEDRATE in the last 72 hours. C-Reactive Protein No results for input(s): CRP in the last 72 hours.  Micro Results: Recent Results (from the past 720 hour(s))  Respiratory Panel by PCR  Status: None   Collection Time: 03/09/18  6:04 PM  Result Value Ref Range Status   Adenovirus NOT DETECTED NOT DETECTED Final   Coronavirus 229E NOT DETECTED NOT DETECTED Final   Coronavirus HKU1 NOT DETECTED NOT DETECTED Final   Coronavirus NL63 NOT DETECTED NOT DETECTED Final   Coronavirus OC43 NOT DETECTED NOT DETECTED Final   Metapneumovirus NOT DETECTED NOT DETECTED Final   Rhinovirus / Enterovirus NOT DETECTED NOT DETECTED Final   Influenza A NOT DETECTED NOT DETECTED Final   Influenza B NOT DETECTED NOT DETECTED Final   Parainfluenza Virus 1 NOT DETECTED NOT DETECTED Final   Parainfluenza Virus 2 NOT DETECTED NOT DETECTED Final   Parainfluenza Virus 3 NOT DETECTED NOT DETECTED Final   Parainfluenza Virus 4 NOT DETECTED NOT DETECTED Final   Respiratory Syncytial Virus NOT DETECTED NOT DETECTED Final   Bordetella pertussis NOT DETECTED NOT DETECTED Final   Chlamydophila pneumoniae NOT DETECTED NOT DETECTED Final   Mycoplasma pneumoniae NOT DETECTED NOT DETECTED Final    Comment: Performed at [facility], [place] [address] [place], [place] [address]  MRSA PCR Screening     Status: None   Collection Time: 03/09/18  6:04 PM  Result Value Ref Range Status   MRSA by PCR NEGATIVE NEGATIVE Final    Comment:        The GeneXpert MRSA Assay (FDA approved for NASAL specimens only), is one component of a comprehensive MRSA colonization surveillance program. It is not intended to diagnose MRSA infection nor to guide or monitor treatment for MRSA infections. Performed at [facility], [place] [address], [place], [place] [address]   Culture, blood (routine x 2)     Status: None (Preliminary result)   Collection Time: 03/09/18   6:41 PM  Result Value Ref Range Status   Specimen Description BLOOD LEFT A-LINE  Final   Special Requests   Final    BOTTLES DRAWN AEROBIC AND ANAEROBIC Blood Culture adequate volume   Culture   Final    NO GROWTH 4 DAYS Performed at [facility], [address], [place], [place] [address]    Report Status PENDING  Incomplete  Culture, blood (routine x 2)     Status: None (Preliminary result)   Collection Time: 03/09/18  6:42 PM  Result Value Ref Range Status   Specimen Description BLOOD LEFT ANTECUBITAL  Final   Special Requests   Final    BOTTLES DRAWN AEROBIC AND ANAEROBIC Blood Culture adequate volume   Culture   Final    NO GROWTH 4 DAYS Performed at [facility], [place] [address], [place], [place] [address]    Report Status PENDING  Incomplete  Culture, blood (single)     Status: None (Preliminary result)   Collection Time: 03/09/18  6:52 PM  Result Value Ref Range Status   Specimen Description BLOOD LEFT HAND  Final   Special Requests   Final    BOTTLES DRAWN AEROBIC AND ANAEROBIC Blood Culture adequate volume   Culture   Final    NO GROWTH 4 DAYS Performed at [facility], [place] [address], [place],  [address]    Report Status PENDING  Incomplete  Surgical PCR screen     Status: None   Collection Time: 03/10/18  6:56 PM  Result Value Ref Range Status   MRSA, PCR NEGATIVE NEGATIVE Final   Staphylococcus aureus NEGATIVE NEGATIVE Final    Comment: (NOTE) The Xpert SA Assay (  FDA approved for NASAL specimens in patients 18 years of age and older), is one component of a comprehensive surveillance program. It is not intended to diagnose infection nor to guide or monitor treatment. Performed at [facility], [place] [address], [place], [place] [address]   Fungus Culture With Stain     Status: None (Preliminary result)   Collection Time: 03/11/18  9:22 AM  Result Value Ref Range Status   Fungus Stain Final report  Final    Comment:  (NOTE) Performed At: [facility] [place], [place] [id number] Rush Farmer MD [id number]    Fungus (Mycology) Culture PENDING  Incomplete   Fungal Source AORTIC VALVE  Final    Comment: Performed at [facility], [place] [address] [place], [place] [address]  Aerobic/Anaerobic Culture (surgical/deep wound)     Status: None (Preliminary result)   Collection Time: 03/11/18  9:22 AM  Result Value Ref Range Status   Specimen Description WOUND  Final   Special Requests AORTIC VALVE  Final   Gram Stain   Final    RARE WBC PRESENT, PREDOMINANTLY MONONUCLEAR NO ORGANISMS SEEN    Culture   Final    NO GROWTH 2 DAYS NO ANAEROBES ISOLATED; CULTURE IN PROGRESS FOR 5 DAYS Performed at [facility], [place] [address] [place], [place] [address]    Report Status PENDING  Incomplete  Acid Fast Smear (AFB)     Status: None   Collection Time: 03/11/18  9:22 AM  Result Value Ref Range Status   AFB Specimen Processing Concentration  Final   Acid Fast Smear Negative  Final    Comment: (NOTE) Performed At: [facility] [place], [place] [id number] Rush Farmer MD [id number]    Source (AFB) AORTIC VALVE  Final    Comment: Performed at [facility], [place] [address], [place], [place] [address]  Fungus Culture Result     Status: None   Collection Time: 03/11/18  9:22 AM  Result Value Ref Range Status   Result 1 Comment  Final    Comment: (NOTE) KOH/Calcofluor preparation:  no fungus observed. Performed At: [facility] [place], [place] [id number] Rush Farmer MD [id number] Performed at [facility], [place] [address], [place], [place] [address]     Studies/Results: Dg Chest Portable 1 View  Result Date: 03/14/2018 CLINICAL DATA:  Post aortic valve repair EXAM: PORTABLE CHEST 1 VIEW COMPARISON:  03/13/2018 FINDINGS: Interval removal of Swan-Ganz catheter. Endotracheal tube, NG tube and right  central line remain in place, unchanged. Changes of aortic valve replacement. Cardiomegaly with diffuse bilateral airspace disease, slightly improved since prior study. No visible effusions or pneumothorax. IMPRESSION: Slight improving diffuse airspace disease, likely edema. No pneumothorax. Electronically Signed   By: Rolm Baptise M.D.   On: 03/14/2018 08:58   Dg Chest Port 1 View  Result Date: 03/13/2018 CLINICAL DATA:  Respiratory failure. Status post AVR. History of CHF. ETT present. EXAM: PORTABLE CHEST 1 VIEW COMPARISON:  Chest x-rays dated 03/12/2018 and 03/11/2018. FINDINGS: Endotracheal tube remains well positioned with tip approximately 2 cm above the carina. Enteric tube passes below the diaphragm. Swan-Ganz catheter tip in the midline. RIGHT IJ central line stable in position with tip at the level of the upper/mid SVC. Patchy bilateral airspace opacities again noted, again most dense in the lower lung zones bilaterally, pneumonia versus pulmonary edema. No pleural effusion or pneumothorax seen. IMPRESSION: 1. Support apparatus  is stable in position, as detailed above. 2. Persistent patchy bilateral airspace opacities, pneumonia versus pulmonary edema, distribution favors edema. Electronically Signed   By: Franki Cabot M.D.   On: 03/13/2018 08:33   Dg Abd Portable 1v  Result Date: 03/13/2018 CLINICAL DATA:  OG-tube placement. EXAM: PORTABLE ABDOMEN - 1 VIEW COMPARISON:  Radiograph 03/10/2018 FINDINGS: Tip and side port of the enteric tube below the diaphragm in the stomach. No bowel dilatation to suggest obstruction. No evidence of free air. Patchy airspace disease at the lung bases again seen. IMPRESSION: Tip and side port of the enteric tube below the diaphragm in the stomach. Electronically Signed   By: Jeb Levering M.D.   On: 03/13/2018 03:35      Assessment/Plan:  INTERVAL HISTORY: Cultures from blood and heart valve tissue to new to be unrevealing.  Failed wean  today  Principal Problem:   Aortic valve endocarditis Active Problems:   Acute respiratory failure with hypoxia (HCC)   Acute decompensated heart failure (HCC)   S/P AVR   Acute pulmonary edema ([place])    Keyan Barre Aydelott is a 57 y.o. 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 Roxan Hockey with AV vegetation sent to micro and path, sp AVR  #1 AV endocarditis: continue vancomycin, ceftriaxone  I have submitted paperwork to Pathology dept to have valve sent to [facility] for PCR broad ranging bacterial, fungal, AFB as well as for specifically Bartonella, Legionella, T. Whippleii   they confirmed to have appropriate specimen to send this usually the turnaround is about 2 weeks time.  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  Culture Result: Pending No Known Allergies  OPAT Orders Discharge antibiotics: Ceftriaxone 2 g IV daily 24 hours and vancomycin Per pharmacy protocol vancomycin Aim for Vancomycin trough 15-20 (unless otherwise indicated) Duration: 6 weeks End Date:  May 19th, 2019  [facility] Care Per Protocol: BIWEEKLY LABS   _x_ BMP w GFR  Labs weekly while on IV antibiotics: _x_ CBC with differential  _x_ Vancomycin trough  _x_ Please pull PIC at completion of IV antibiotics __ Please leave PIC in place until doctor has seen patient or been notified  Fax weekly labs to [phone]  Clinic Follow Up Appt:  Next 3-4 weeks with [place]  Once his intraop bacterial cultures are finalized I will sign off and followup on PCR data and when he sees me in clinic.   LOS: 5 days   Alcide Evener 03/14/2018, 10:46 AM

## 2018-03-14 NOTE — Progress Notes (Signed)
3 Days Post-Op Procedure(s) (LRB): AORTIC VALVE REPLACEMENT (AVR) USING 21 MM MAGNA EASE PERICARDIAL BIOPROSTHESIS-AORTIC, MODEL 3300TFX, SERIAL # [id number] (N/A) VIDEO BRONCHOSCOPY (N/A) Subjective: Intubated, awake and responsive  Objective: Vital signs in last 24 hours: Temp:  [98.2 F (36.8 C)-99.7 F (37.6 C)] 98.2 F (36.8 C) (04/10 2000) Pulse Rate:  [68-118] 80 (04/11 0700) Cardiac Rhythm: Atrial paced (04/11 0400) Resp:  [18-33] 27 (04/11 0700) BP: (74-103)/(51-74) 81/61 (04/11 0700) SpO2:  [94 %-100 %] 95 % (04/11 0700) FiO2 (%):  [50 %] 50 % (04/11 0400) Weight:  [159 lb 13.3 oz (72.5 kg)] 159 lb 13.3 oz (72.5 kg) (04/11 0430)  Hemodynamic parameters for last 24 hours: PAP: (34-39)/(13-25) 37/17 CO:  [5 L/min] 5 L/min CI:  [2.9 L/min/m2] 2.9 L/min/m2  Intake/Output from previous day: 04/10 0701 - 04/11 0700 In: 2783 [I.V.:1060; NG/GT:973; IV Piggyback:750] Out: 2300 [Urine:1800; Emesis/NG output:500] Intake/Output this shift: No intake/output data recorded.  General appearance: alert and cooperative Neurologic: intact Heart: regular rate and rhythm Lungs: coarse BS bilaterally Abdomen: mildly distended + BS  Lab Results: Recent Labs    03/13/18 0341 03/14/18 0445  WBC 11.0* 9.6  HGB 8.9* 9.0*  HCT 28.0* 27.8*  PLT 134* 170   BMET:  Recent Labs    03/13/18 0341 03/14/18 0445  NA 135 136  K 3.8 3.7  CL 100* 105  CO2 26 25  GLUCOSE 104* 126*  BUN 10 12  CREATININE 0.65 0.56*  CALCIUM 7.5* 7.5*    PT/INR:  Recent Labs    03/11/18 1208  LABPROT 20.5*  INR 1.78   ABG    Component Value Date/Time   PHART 7.439 03/13/2018 1216   HCO3 25.3 03/13/2018 1216   TCO2 26 03/13/2018 1216   O2SAT 66.9 03/14/2018 0410   CBG (last 3)  Recent Labs    03/13/18 2044 03/13/18 2348 03/14/18 0425  GLUCAP 126* 109* 134*    Assessment/Plan: S/P Procedure(s) (LRB): AORTIC VALVE REPLACEMENT (AVR) USING 21 MM MAGNA EASE PERICARDIAL  BIOPROSTHESIS-AORTIC, MODEL 3300TFX, SERIAL # [id number] (N/A) VIDEO BRONCHOSCOPY (N/A) - Aortic valve endocarditis with severe AI and acute diastolic heart failure  S/p AVR- tissue valve- plan to use ASA only, no coumadin unless he has other indication  Co-ox 67 off dobutamine  Relative hypotension- on low dose neo drip  RESP_ VDRF secondary to pulmonary edema, probable component of acute lung injury  Vent per CCM  RENAL- creatinine OK. Diuresis limited by blood pressure  ENDO- CBG well controlled  DVT prophylaxis- SCD + enoxaparin  Nutrition- on tube feedings  LOS: 5 days    Loreli SlotSteven C Hendrickson 03/14/2018

## 2018-03-14 NOTE — Progress Notes (Signed)
PULMONARY / CRITICAL CARE MEDICINE   Name: John Byrd MRN: [id number] DOB: 1961/05/11    ADMISSION DATE:  03/09/2018 CONSULTATION DATE:  4/6/419  REFERRING MD:  Enedina FinnerStepheen Kohut  CHIEF COMPLAINT:  dyspnea  HISTORY OF PRESENT ILLNESS:   57 year old male nonsmoker with no known past medical history who presents 4/6 with 3 weeks of progressive dyspnea, lower extremity edema and nonproductive cough.  He had a chest x-ray performed 1 week prior to admit for this complaint which showed multifocal airspace opacities however he has not followed up for additional care for this.  He denies fevers, night sweats, weight loss, chest pain, nausea, vomiting, diarrhea, myalgias, sick contacts.  No sore throat.  Did report that he was having occasional chills a couple of months ago that improved with taking over-the-counter medicine.  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  He is from [place] though his girlfriend lives here.    In the emergency department he was noted to be acutely tachypneic, oxygen saturation in the 80s on room air which did not improve with a nonrebreather and so he was started on BiPAP and given a dose of Lasix, ceftriaxone and azithromycin.    4/8- patient found to have significant aortic regurgitation and large embolus. Did not improve with conservative treatment. Currently s/p aortic valve replacement   SUBJECTIVE:  No events overnight, weaning, neo down to 10 and dobutamine off  VITAL SIGNS: BP (!) 89/64   Pulse 80   Temp 98.7 F (37.1 C) (Oral)   Resp (!) 25   Ht 5\' 3"  (1.6 m)   Wt 159 lb 13.3 oz (72.5 kg)   SpO2 95%   BMI 28.31 kg/m   HEMODYNAMICS: PAP: (34-39)/(13-25) 37/17 CO:  [5 L/min] 5 L/min CI:  [2.9 L/min/m2] 2.9 L/min/m2  VENTILATOR SETTINGS: Vent Mode: PRVC FiO2 (%):  [50 %] 50 % Set Rate:  [20 bmp] 20 bmp Vt Set:  [460 mL] 460 mL PEEP:  [5 cmH20] 5  cmH20 Pressure Support:  [10 cmH20] 10 cmH20  INTAKE / OUTPUT: I/O last 3 completed shifts: In: 3590.2 [I.V.:1567.2; NG/GT:973; IV Piggyback:1050] Out: 2615 [Urine:2115; Emesis/NG output:500]  PHYSICAL EXAMINATION: General: Well developed male, resting comfortable in exam bed, weaning Neuro: Awake, opens eyes and moves all ext to command Cardiovascular: Sternal wound noted, RRR, click noted Lungs: Coarse BS diffusely Abdomen: Rounds, soft, NT, ND and +BS Musculoskeletal: 1+ edema bilaterally  LABS:  BMET Recent Labs  Lab 03/12/18 0352  03/12/18 1635 03/13/18 0341 03/14/18 0445  NA 135   < > 138 135 136  K 3.6   < > 3.4* 3.8 3.7  CL 103  --  98* 100* 105  CO2 25  --   --  26 25  BUN 11  --  8 10 12   CREATININE 0.69   < > 0.50* 0.65 0.56*  GLUCOSE 94   < > 115* 104* 126*   < > = values in this interval not displayed.    Electrolytes Recent Labs  Lab 03/12/18 0352  03/13/18 0341 03/13/18 1210 03/13/18 1640 03/14/18 0445  CALCIUM 7.4*  --  7.5*  --   --  7.5*  MG 2.6*   < >  --  2.1 2.2 2.1  PHOS  --   --   --  2.8 3.2 3.7   < > = values in this interval not displayed.    CBC Recent Labs  Lab  03/12/18 1629 03/12/18 1635 03/13/18 0341 03/14/18 0445  WBC 12.1*  --  11.0* 9.6  HGB 9.3* 9.5* 8.9* 9.0*  HCT 28.8* 28.0* 28.0* 27.8*  PLT 135*  --  134* 170    Coag's Recent Labs  Lab 03/10/18 1854 03/11/18 1208  APTT 34 36  INR 1.28 1.78    Sepsis Markers Recent Labs  Lab 03/09/18 1849 03/10/18 0928 03/11/18 0304  LATICACIDVEN 1.1  --   --   PROCALCITON 0.29 1.01 0.84    ABG Recent Labs  Lab 03/11/18 1722 03/12/18 1303 03/13/18 1216  PHART 7.329* 7.481* 7.439  PCO2ART 52.3* 32.8 37.5  PO2ART 77.0* 53.0* 100.0    Liver Enzymes Recent Labs  Lab 03/09/18 1849 03/11/18 0304 03/14/18 0445  AST 84* 44* 29  ALT 94* 79* 35  ALKPHOS 147* 129* 138*  BILITOT 1.7* 1.5* 1.1  ALBUMIN 1.8* 1.6* 1.9*    Cardiac Enzymes No results for  input(s): TROPONINI, PROBNP in the last 168 hours.  Glucose Recent Labs  Lab 03/13/18 0826 03/13/18 1216 03/13/18 1637 03/13/18 2044 03/13/18 2348 03/14/18 0425  GLUCAP 87 106* 101* 126* 109* 134*    Imaging No results found.   STUDIES:  2D echo 4/6>>> severe AI, aortic valve vegetation   CULTURES: BC x 2 4/6>>> BC x 2 4/7>>> AFB 4/8>>Negative x 1 Surgical Culture Aortic valve 4/8>>  Fungal aortic valve leaflets 4/8 RVP 4/6>>> NEG   ANTIBIOTICS: Vanc 4/6>>> Rocephin 4/6>>> Azithro 4/6>>>  SIGNIFICANT EVENTS: 4/9 Aortic Valve Replacement  LINES/TUBES: ETT 4/6>>> R IJ CVL 4/6>>>  DISCUSSION: 57 year old male with no known history admitted 4/7 with acute respiratory failure, found to have acute diastolic CHF due to aortic insufficiency in the setting of subacute bacterial endocarditis.  ASSESSMENT / PLAN:  PULMONARY Ventilator dependent respiratory failure secondary to Acute hypoxemic respiratory failure secondary to severe aortic valve regurgitation  Tolerating CPAP/PS 4/9 after lasix P:   Drop PS to 5/5 and wean as able Hold off extubation today CXR/ABG prn Double dose of lasix for today and will write for it on a daily bases  CARDIOVASCULAR Acute diastolic heart failure secondary to aortic valve regurgitation secondary to valve endocarditis s/p bioprosthetic valve replacement  Aortic insufficiency Subacute bacterial endocarditis-aortic valve Shock-cardiogenic P:  Weaning inotropes, neo is down to 10 Cardiology and CTS following Tele monitoring MAP goal > 65 EKG prn CVP changed to Q shift, last was 10 on 4/10 AM  RENAL Hyponatremia- resolved  P:   Follow BMET daily  and Urine output Replace electrolytes as needed Strict I/O Lasix 40 mg IV q8 x2 doses today Replace K  GASTROINTESTINAL Elevated LFTs-likely secondary to hepatic congestion - resolved  P:   TF at goal Famotidine PPX  HEMATOLOGIC Anemia P:  Trend CBC Transfuse PRBC's   per TCTS Lovenox for DVT proph  INFECTIOUS Subacute bacterial endocarditis.  No known indwelling hardware or history of IV drug abuse. T max 100.9 P:   Cultures pending-negative to date ID following, so far rocephin/vanc for 6 wks until PCR results are back Continue antibiotics as above  Trend fever/ WBC Re- Culture as is clinically indicated  ENDOCRINE No active issue P:   Monitor glucose on chemistries  NEUROLOGIC Sedation needs on vent P:   RASS goal: -1 Precedex gtt Daily wake up assessment Minimize sedation as able   FAMILY  No family bedside 4/11  - Inter-disciplinary family meet or Palliative Care meeting due by:  Day 7  The patient is critically  ill with multiple organ systems failure and requires high complexity decision making for assessment and support, frequent evaluation and titration of therapies, application of advanced monitoring technologies and extensive interpretation of multiple databases.   Critical Care Time devoted to patient care services described in this note is  35  Minutes. This time reflects time of care of this signee Dr Jennet Maduro. This critical care time does not reflect procedure time, or teaching time or supervisory time of PA/NP/Med student/Med Resident etc but could involve care discussion time.  Rush Farmer, M.D. [facility] Pulmonary/Critical Care Medicine. Pager: [phone]. After hours pager: [phone].

## 2018-03-14 NOTE — Progress Notes (Signed)
CT surgery p.m. Rounds  Patient responsive on ventilator Had pressure support weaning trials today but became tachypneic Blood pressure stable on low-dose neo- Temperature 101 today

## 2018-03-14 NOTE — Progress Notes (Signed)
PHARMACY CONSULT NOTE FOR:  OUTPATIENT  PARENTERAL ANTIBIOTIC THERAPY (OPAT)  Indication: Culture Negative Endocarditis Regimen: Vancomycin 750mg  IV Q8h and Ceftriaxone 2g IV Q24h End date: 04/21/18  IV antibiotic discharge orders are pended. To discharging provider:  please sign these orders via discharge navigator,  Select New Orders & click on the button choice - Manage This Unsigned Work.   Thank you for allowing pharmacy to be a part of this patient's care.  Armandina StammerBATCHELDER,Ezel Vallone J 03/14/2018, 11:34 AM

## 2018-03-14 NOTE — Progress Notes (Signed)
Returned to full support d/t increased WOB tachypnea

## 2018-03-15 ENCOUNTER — Inpatient Hospital Stay (HOSPITAL_COMMUNITY): Payer: Self-pay

## 2018-03-15 ENCOUNTER — Other Ambulatory Visit: Payer: Self-pay

## 2018-03-15 DIAGNOSIS — I339 Acute and subacute endocarditis, unspecified: Principal | ICD-10-CM

## 2018-03-15 LAB — CBC
HCT: 30.1 % — ABNORMAL LOW (ref 39.0–52.0)
Hemoglobin: 9.7 g/dL — ABNORMAL LOW (ref 13.0–17.0)
MCH: 29.2 pg (ref 26.0–34.0)
MCHC: 32.2 g/dL (ref 30.0–36.0)
MCV: 90.7 fL (ref 78.0–100.0)
Platelets: 206 10*3/uL (ref 150–400)
RBC: 3.32 MIL/uL — ABNORMAL LOW (ref 4.22–5.81)
RDW: 17.7 % — ABNORMAL HIGH (ref 11.5–15.5)
WBC: 9.1 10*3/uL (ref 4.0–10.5)

## 2018-03-15 LAB — COOXEMETRY PANEL
Carboxyhemoglobin: 1.2 % (ref 0.5–1.5)
Methemoglobin: 1.1 % (ref 0.0–1.5)
O2 Saturation: 63.5 %
Total hemoglobin: 10.9 g/dL — ABNORMAL LOW (ref 12.0–16.0)

## 2018-03-15 LAB — POCT I-STAT 3, ART BLOOD GAS (G3+)
BICARBONATE: 24.5 mmol/L (ref 20.0–28.0)
O2 Saturation: 96 %
PH ART: 7.394 (ref 7.350–7.450)
TCO2: 26 mmol/L (ref 22–32)
pCO2 arterial: 40.2 mmHg (ref 32.0–48.0)
pO2, Arterial: 83 mmHg (ref 83.0–108.0)

## 2018-03-15 LAB — GLUCOSE, CAPILLARY
GLUCOSE-CAPILLARY: 110 mg/dL — AB (ref 65–99)
GLUCOSE-CAPILLARY: 83 mg/dL (ref 65–99)
Glucose-Capillary: 138 mg/dL — ABNORMAL HIGH (ref 65–99)
Glucose-Capillary: 114 mg/dL — ABNORMAL HIGH (ref 65–99)
Glucose-Capillary: 95 mg/dL (ref 65–99)
Glucose-Capillary: 88 mg/dL (ref 65–99)

## 2018-03-15 LAB — COMPREHENSIVE METABOLIC PANEL
ALBUMIN: 1.8 g/dL — AB (ref 3.5–5.0)
ALK PHOS: 156 U/L — AB (ref 38–126)
ALT: 34 U/L (ref 17–63)
AST: 40 U/L (ref 15–41)
Anion gap: 6 (ref 5–15)
BUN: 12 mg/dL (ref 6–20)
CHLORIDE: 105 mmol/L (ref 101–111)
CO2: 25 mmol/L (ref 22–32)
Calcium: 7.7 mg/dL — ABNORMAL LOW (ref 8.9–10.3)
Creatinine, Ser: 0.55 mg/dL — ABNORMAL LOW (ref 0.61–1.24)
GFR calc Af Amer: >60 mL/min (ref >60)
GFR calc non Af Amer: >60 mL/min (ref >60)
GLUCOSE: 160 mg/dL — AB (ref 65–99)
Potassium: 3.8 mmol/L (ref 3.5–5.1)
SODIUM: 136 mmol/L (ref 135–145)
TOTAL PROTEIN: 6.4 g/dL — AB (ref 6.5–8.1)
Total Bilirubin: 0.8 mg/dL (ref 0.3–1.2)

## 2018-03-15 LAB — MAGNESIUM: Magnesium: 2 mg/dL (ref 1.7–2.4)

## 2018-03-15 LAB — PHOSPHORUS: PHOSPHORUS: 3.5 mg/dL (ref 2.5–4.6)

## 2018-03-15 LAB — SEDIMENTATION RATE: Sed Rate: 102 mm/hr — ABNORMAL HIGH (ref 0–16)

## 2018-03-15 MED ORDER — SODIUM CHLORIDE 0.9 % IV SOLN
0.4000 ug/kg/h | INTRAVENOUS | Status: DC
  Filled 2018-03-15: qty 4

## 2018-03-15 MED ORDER — VANCOMYCIN HCL 10 G IV SOLR
1250.0000 mg | Freq: Two times a day (BID) | INTRAVENOUS | Status: DC
  Administered 2018-03-15 – 2018-03-18 (×7): 1250 mg via INTRAVENOUS
  Filled 2018-03-15 (×7): qty 1250

## 2018-03-15 MED ORDER — WHITE PETROLATUM EX OINT
TOPICAL_OINTMENT | CUTANEOUS | Status: AC
  Administered 2018-03-15: 17:00:00
  Filled 2018-03-15: qty 28.35

## 2018-03-15 MED ORDER — FUROSEMIDE 10 MG/ML IJ SOLN
40.0000 mg | Freq: Two times a day (BID) | INTRAMUSCULAR | Status: DC
  Administered 2018-03-15 – 2018-03-17 (×5): 40 mg via INTRAVENOUS
  Filled 2018-03-15 (×5): qty 4

## 2018-03-15 MED ORDER — DOXYCYCLINE HYCLATE 100 MG PO TABS
100.0000 mg | ORAL_TABLET | Freq: Two times a day (BID) | ORAL | Status: DC
  Administered 2018-03-15 – 2018-03-19 (×8): 100 mg via ORAL
  Filled 2018-03-15 (×8): qty 1

## 2018-03-15 MED ORDER — ALBUMIN HUMAN 25 % IV SOLN
12.5000 g | Freq: Four times a day (QID) | INTRAVENOUS | Status: AC
  Administered 2018-03-15 (×3): 12.5 g via INTRAVENOUS
  Filled 2018-03-15 (×3): qty 50

## 2018-03-15 MED ORDER — ORAL CARE MOUTH RINSE
15.0000 mL | Freq: Two times a day (BID) | OROMUCOSAL | Status: DC
  Administered 2018-03-15 – 2018-03-16 (×3): 15 mL via OROMUCOSAL

## 2018-03-15 NOTE — Progress Notes (Signed)
Subjective:  Having  Multiple loose stools, small   Antibiotics:  Anti-infectives (From admission, onward)   Start     Dose/Rate Route Frequency Ordered Stop   03/15/18 2200  doxycycline (VIBRA-TABS) tablet 100 mg     100 mg Oral Every 12 hours 03/15/18 1653     03/15/18 1200  vancomycin (VANCOCIN) 1,250 mg in sodium chloride 0.9 % 250 mL IVPB     1,250 mg 166.7 mL/hr over 90 Minutes Intravenous Every 12 hours 03/15/18 0937     03/11/18 1830  vancomycin (VANCOCIN) IVPB 1000 mg/200 mL premix  Status:  Discontinued     1,000 mg 200 mL/hr over 60 Minutes Intravenous  Once 03/11/18 1141 03/11/18 1145   03/11/18 1430  ceFAZolin (ANCEF) IVPB 2g/100 mL premix  Status:  Discontinued     2 g 200 mL/hr over 30 Minutes Intravenous Every 8 hours 03/11/18 1141 03/11/18 2023   03/11/18 0815  vancomycin (VANCOCIN) 500 mg in sodium chloride 0.9 % 100 mL IVPB     500 mg 100 mL/hr over 60 Minutes Intravenous To Surgery 03/11/18 0806 03/11/18 1225   03/11/18 0400  vancomycin (VANCOCIN) 1,250 mg in sodium chloride 0.9 % 250 mL IVPB  Status:  Discontinued     1,250 mg 166.7 mL/hr over 90 Minutes Intravenous To Surgery 03/10/18 1919 03/11/18 1140   03/11/18 0400  cefUROXime (ZINACEF) 1.5 g in sodium chloride 0.9 % 100 mL IVPB     1.5 g 200 mL/hr over 30 Minutes Intravenous To Surgery 03/10/18 1919 03/11/18 1225   03/11/18 0400  cefUROXime (ZINACEF) 750 mg in sodium chloride 0.9 % 100 mL IVPB  Status:  Discontinued     750 mg 200 mL/hr over 30 Minutes Intravenous To Surgery 03/10/18 1917 03/11/18 1140   03/10/18 1800  cefTRIAXone (ROCEPHIN) 2 g in sodium chloride 0.9 % 100 mL IVPB     2 g 200 mL/hr over 30 Minutes Intravenous Every 24 hours 03/09/18 1756     03/10/18 0400  vancomycin (VANCOCIN) IVPB 750 mg/150 ml premix  Status:  Discontinued     750 mg 150 mL/hr over 60 Minutes Intravenous Every 8 hours 03/09/18 1719 03/15/18 0937   03/09/18 1800  cefTRIAXone (ROCEPHIN) 1 g in sodium  chloride 0.9 % 100 mL IVPB     1 g 200 mL/hr over 30 Minutes Intravenous  Once 03/09/18 1756 03/09/18 1925   03/09/18 1730  vancomycin (VANCOCIN) 1,500 mg in sodium chloride 0.9 % 500 mL IVPB     1,500 mg 250 mL/hr over 120 Minutes Intravenous  Once 03/09/18 1717 03/09/18 2303   03/09/18 1330  cefTRIAXone (ROCEPHIN) 1 g in sodium chloride 0.9 % 100 mL IVPB     1 g 200 mL/hr over 30 Minutes Intravenous  Once 03/09/18 1318 03/09/18 1508   03/09/18 1330  azithromycin (ZITHROMAX) 500 mg in sodium chloride 0.9 % 250 mL IVPB     500 mg 250 mL/hr over 60 Minutes Intravenous  Once 03/09/18 1318 03/09/18 1634      Medications: Scheduled Meds: . acetaminophen  1,000 mg Oral Q6H   Or  . acetaminophen (TYLENOL) oral liquid 160 mg/5 mL  1,000 mg Per Tube Q6H  . acetaminophen (TYLENOL) oral liquid 160 mg/5 mL  650 mg Per Tube Once   Or  . acetaminophen  650 mg Rectal Once  . aspirin EC  325 mg Oral Daily   Or  . aspirin  324 mg Per  Tube Daily  . bisacodyl  10 mg Oral Daily   Or  . bisacodyl  10 mg Rectal Daily  . Chlorhexidine Gluconate Cloth  6 each Topical Daily  . docusate sodium  200 mg Oral Daily  . doxycycline  100 mg Oral Q12H  . enoxaparin (LOVENOX) injection  40 mg Subcutaneous QHS  . feeding supplement (PRO-STAT SUGAR FREE 64)  30 mL Per Tube Daily  . furosemide  40 mg Intravenous Q12H  . insulin aspart  0-24 Units Subcutaneous Q4H  . mouth rinse  15 mL Mouth Rinse BID  . metoprolol tartrate  12.5 mg Oral BID   Or  . metoprolol tartrate  12.5 mg Per Tube BID  . pantoprazole sodium  40 mg Per Tube Daily  . sodium chloride flush  10-40 mL Intracatheter Q12H  . sodium chloride flush  3 mL Intravenous Q12H   Continuous Infusions: . sodium chloride Stopped (03/14/18 1900)  . sodium chloride    . sodium chloride Stopped (03/12/18 0900)  . albumin human    . cefTRIAXone (ROCEPHIN)  IV Stopped (03/14/18 1856)  . dexmedetomidine (PRECEDEX) IV infusion Stopped (03/15/18 1020)  .  feeding supplement (VITAL AF 1.2 CAL) 60 mL/hr at 03/15/18 0700  . lactated ringers    . lactated ringers    . lactated ringers 10 mL/hr at 03/15/18 0700  . nitroGLYCERIN Stopped (03/11/18 1228)  . phenylephrine (NEO-SYNEPHRINE) Adult infusion 10 mcg/min (03/15/18 0858)  . potassium chloride Stopped (03/14/18 1004)  . vancomycin Stopped (03/15/18 1445)   PRN Meds:.sodium chloride, lactated ringers, metoprolol tartrate, midazolam, morphine injection, ondansetron (ZOFRAN) IV, potassium chloride, sodium chloride flush, sodium chloride flush, traMADol    Objective: Weight change: 7.1 oz (0.2 kg)  Intake/Output Summary (Last 24 hours) at 03/15/2018 1738 Last data filed at 03/15/2018 1500 Gross per 24 hour  Intake 2653.16 ml  Output 1530 ml  Net 1123.16 ml   Blood pressure (!) 85/51, pulse 96, temperature 99.5 F (37.5 C), resp. rate (!) 3, height 5\' 3"  (1.6 m), weight 160 lb 4.4 oz (72.7 kg), SpO2 95 %. Temp:  [98.1 F (36.7 C)-99.5 F (37.5 C)] 99.5 F (37.5 C) (04/12 1615) Pulse Rate:  [78-96] 96 (04/12 1615) Resp:  [3-27] 3 (04/12 1615) BP: (71-112)/(35-84) 85/51 (04/12 1615) SpO2:  [90 %-100 %] 95 % (04/12 1615) FiO2 (%):  [36 %-50 %] 36 % (04/12 1230) Weight:  [160 lb 4.4 oz (72.7 kg)] 160 lb 4.4 oz (72.7 kg) (04/12 0545)  Physical Exam: General AO x 3, hoarse voice HEENT: anicteric sclera, EOMI CVS regular rate, normal r, click heard Chest: Fairly clear anteriorly Abdomen: soft  nondistended,  Extremities: no  clubbing or edema noted bilaterally Skin: no rashes, bandage intact Neuro: nonfocal  CBC:  CBC Latest Ref Rng & Units 03/15/2018 03/14/2018 03/13/2018  WBC 4.0 - 10.5 K/uL 9.1 9.6 11.0(H)  Hemoglobin 13.0 - 17.0 g/dL 1.6(X) 0.9(U) 8.9(L)  Hematocrit 39.0 - 52.0 % 30.1(L) 27.8(L) 28.0(L)  Platelets 150 - 400 K/uL 206 170 134(L)     BMET Recent Labs    03/14/18 0445 03/15/18 0359  NA 136 136  K 3.7 3.8  CL 105 105  CO2 25 25  GLUCOSE 126* 160*  BUN 12  12  CREATININE 0.56* 0.55*  CALCIUM 7.5* 7.7*     Liver Panel  Recent Labs    03/14/18 0445 03/15/18 0359  PROT 6.0* 6.4*  ALBUMIN 1.9* 1.8*  AST 29 40  ALT 35 34  ALKPHOS  138* 156*  BILITOT 1.1 0.8       Sedimentation Rate Recent Labs    03/15/18 0359  ESRSEDRATE 102*   C-Reactive Protein No results for input(s): CRP in the last 72 hours.  Micro Results: Recent Results (from the past 720 hour(s))  Respiratory Panel by PCR     Status: None   Collection Time: 03/09/18  6:04 PM  Result Value Ref Range Status   Adenovirus NOT DETECTED NOT DETECTED Final   Coronavirus 229E NOT DETECTED NOT DETECTED Final   Coronavirus HKU1 NOT DETECTED NOT DETECTED Final   Coronavirus NL63 NOT DETECTED NOT DETECTED Final   Coronavirus OC43 NOT DETECTED NOT DETECTED Final   Metapneumovirus NOT DETECTED NOT DETECTED Final   Rhinovirus / Enterovirus NOT DETECTED NOT DETECTED Final   Influenza A NOT DETECTED NOT DETECTED Final   Influenza B NOT DETECTED NOT DETECTED Final   Parainfluenza Virus 1 NOT DETECTED NOT DETECTED Final   Parainfluenza Virus 2 NOT DETECTED NOT DETECTED Final   Parainfluenza Virus 3 NOT DETECTED NOT DETECTED Final   Parainfluenza Virus 4 NOT DETECTED NOT DETECTED Final   Respiratory Syncytial Virus NOT DETECTED NOT DETECTED Final   Bordetella pertussis NOT DETECTED NOT DETECTED Final   Chlamydophila pneumoniae NOT DETECTED NOT DETECTED Final   Mycoplasma pneumoniae NOT DETECTED NOT DETECTED Final    Comment: Performed at [facility], [address] [place], [place] [address]  MRSA PCR Screening     Status: None   Collection Time: 03/09/18  6:04 PM  Result Value Ref Range Status   MRSA by PCR NEGATIVE NEGATIVE Final    Comment:        The GeneXpert MRSA Assay (FDA approved for NASAL specimens only), is one component of a comprehensive MRSA colonization surveillance program. It is not intended to diagnose MRSA infection nor to guide or monitor  treatment for MRSA infections. Performed at [facility], [address] [place], [place] [address]   Culture, blood (routine x 2)     Status: None   Collection Time: 03/09/18  6:41 PM  Result Value Ref Range Status   Specimen Description BLOOD LEFT A-LINE  Final   Special Requests   Final    BOTTLES DRAWN AEROBIC AND ANAEROBIC Blood Culture adequate volume   Culture   Final    NO GROWTH 5 DAYS Performed at [facility], [address], [place], [place] [address]    Report Status 03/14/2018 FINAL  Final  Culture, blood (routine x 2)     Status: None   Collection Time: 03/09/18  6:42 PM  Result Value Ref Range Status   Specimen Description BLOOD LEFT ANTECUBITAL  Final   Special Requests   Final    BOTTLES DRAWN AEROBIC AND ANAEROBIC Blood Culture adequate volume   Culture   Final    NO GROWTH 5 DAYS Performed at [facility], [address], [place], [place] [address]    Report Status 03/14/2018 FINAL  Final  Culture, blood (single)     Status: None   Collection Time: 03/09/18  6:52 PM  Result Value Ref Range Status   Specimen Description BLOOD LEFT HAND  Final   Special Requests   Final    BOTTLES DRAWN AEROBIC AND ANAEROBIC Blood Culture adequate volume   Culture   Final    NO GROWTH 5 DAYS Performed at [facility], [address] [place], [place] [address]  Report Status 03/14/2018 FINAL  Final  Surgical PCR screen     Status: None   Collection Time: 03/10/18  6:56 PM  Result Value Ref Range Status   MRSA, PCR NEGATIVE NEGATIVE Final   Staphylococcus aureus NEGATIVE NEGATIVE Final    Comment: (NOTE) The Xpert SA Assay (FDA approved for NASAL specimens in patients 41 years of age and older), is one component of a comprehensive surveillance program. It is not intended to diagnose infection nor to guide or monitor treatment. Performed at [facility], [address] [place], [place] [address]   Fungus Culture With Stain     Status:  None (Preliminary result)   Collection Time: 03/11/18  9:22 AM  Result Value Ref Range Status   Fungus Stain Final report  Final    Comment: (NOTE) Performed At: [facility] [address] [place], [place] [id number] Jolene Schimke MD XB:[id number]    Fungus (Mycology) Culture PENDING  Incomplete   Fungal Source AORTIC VALVE  Final    Comment: Performed at [facility], [address] [place], [place] [address]  Aerobic/Anaerobic Culture (surgical/deep wound)     Status: None (Preliminary result)   Collection Time: 03/11/18  9:22 AM  Result Value Ref Range Status   Specimen Description WOUND  Final   Special Requests AORTIC VALVE  Final   Gram Stain   Final    RARE WBC PRESENT, PREDOMINANTLY MONONUCLEAR NO ORGANISMS SEEN    Culture   Final    NO GROWTH 4 DAYS NO ANAEROBES ISOLATED; CULTURE IN PROGRESS FOR 5 DAYS Performed at [facility], [address], [place], [place] [address]    Report Status PENDING  Incomplete  Acid Fast Smear (AFB)     Status: None   Collection Time: 03/11/18  9:22 AM  Result Value Ref Range Status   AFB Specimen Processing Concentration  Final   Acid Fast Smear Negative  Final    Comment: (NOTE) Performed At: [facility] [address] [place], [place] [id number] Jolene Schimke MD KG:[id number]    Source (AFB) AORTIC VALVE  Final    Comment: Performed at [facility], [address], [place], [place] [address]  Fungus Culture Result     Status: None   Collection Time: 03/11/18  9:22 AM  Result Value Ref Range Status   Result 1 Comment  Final    Comment: (NOTE) KOH/Calcofluor preparation:  no fungus observed. Performed At: [facility] [address] [place], [place] [id number] Jolene Schimke MD OV:[id number] Performed at [facility], [address] [place], [place] [address]   Culture, respiratory (NON-Expectorated)     Status: None (Preliminary result)   Collection Time: 03/15/18 12:49  PM  Result Value Ref Range Status   Specimen Description TRACHEAL ASPIRATE  Final   Special Requests Normal  Final   Gram Stain   Final    FEW WBC PRESENT,BOTH PMN AND MONONUCLEAR NO SQUAMOUS EPITHELIAL CELLS SEEN RARE GRAM POSITIVE COCCI IN PAIRS Performed at [facility], [address], [place], [place] [address]    Culture PENDING  Incomplete   Report Status PENDING  Incomplete    Studies/Results: Dg Chest Port 1 View  Result Date: 03/15/2018 CLINICAL DATA:  Status post aortic valve replacement EXAM: PORTABLE CHEST 1 VIEW COMPARISON:  03/14/2018 FINDINGS: Cardiac shadow is stable. Postoperative changes are again seen. Endotracheal tube, nasogastric catheter and right jugular central line are again seen and  stable. Diffuse bilateral airspace disease is identified stable from the previous exam. No pneumothorax is seen. No acute bony abnormality is noted. IMPRESSION: Stable bilateral infiltrates. Electronically Signed   By: Alcide CleverMark  Lukens M.D.   On: 03/15/2018 08:08   Dg Chest Portable 1 View  Result Date: 03/14/2018 CLINICAL DATA:  Post aortic valve repair EXAM: PORTABLE CHEST 1 VIEW COMPARISON:  03/13/2018 FINDINGS: Interval removal of Swan-Ganz catheter. Endotracheal tube, NG tube and right central line remain in place, unchanged. Changes of aortic valve replacement. Cardiomegaly with diffuse bilateral airspace disease, slightly improved since prior study. No visible effusions or pneumothorax. IMPRESSION: Slight improving diffuse airspace disease, likely edema. No pneumothorax. Electronically Signed   By: Charlett NoseKevin  Dover M.D.   On: 03/14/2018 08:58      Assessment/Plan:  INTERVAL HISTORY: Cultures from blood and heart valve tissue to new to be unrevealing.  Failed wean today  Principal Problem:   Aortic valve endocarditis Active Problems:   Acute respiratory failure with hypoxia (HCC)   Acute decompensated heart failure (HCC)   S/P AVR   Acute pulmonary edema  (HCC)    John Byrd is a 57 y.o. 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 Dorris FetchHendrickson with AV vegetation sent to micro and path, sp AVR.  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 He does not have pet birds or avian exposure.   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 I will send serologies for Q fever and also for Bartonella, and I will send Brucella antibody.  I will check a Legionella in the urine.  I will add doxycycline and continue  vancomycin, ceftriaxone  I have submitted paperwork to Pathology dept to have valve sent [facility] for PCR broad ranging bacterial, fungal, AFB as well as for specifically Bartonella, Legionella, T. Whippleii   they confirmed to have appropriate specimen to send this usually the turnaround is about 2 weeks time.  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  Culture Result: Pending No Known Allergies  OPAT Orders Discharge antibiotics: Ceftriaxone 2 g IV daily 24 hours, twice daily oral doxycycline and vancomycin Per pharmacy protocol vancomycin Aim for Vancomycin trough 15-20 (unless otherwise indicated) Duration: 6 weeks End Date:  May 19th, 2019  [facility]: BI[facility]EEK[facility] LAB[facility]   _x_ BMP w GFR  Labs weekly while on IV antibiotics: _x_ CBC with differential  _x_ Vancomycin trough  _x_ Please pull PIC at completion of IV antibiotics __ Please leave PIC in place until doctor has seen patient or been notified  Fax weekly labs to  [phone]) [phone]  Clinic Follow Up Appt:  Next 3-4 weeks with us  Dr. Luciana Axeomer is available for questions this weekend.     LOS: 6 days   Acey LavCornelius Van Dam 03/15/2018, 5:38 PM

## 2018-03-15 NOTE — Progress Notes (Signed)
Extubated to n/c coughing frequently. Translator service used to explain the procedure to patient. Taught how to use suction. RR even unlabored. Patient having numerous stools today, on and off the bedpan, moving well all 4 extremities.

## 2018-03-15 NOTE — Progress Notes (Signed)
eLink Physician-Brief Progress Note Patient Name: [name] DOB: 05-12-1961 MRN: [id number]   Date of Service  03/15/2018  HPI/Events of Note  Bucking vent  eICU Interventions  Increase precedex gtt If no response, consider fent gtt     Intervention Category Major Interventions: Other:  Kalman ShanMurali Caroleena Paolini 03/15/2018, 1:13 AM

## 2018-03-15 NOTE — Progress Notes (Signed)
Patient ID: John KrebsValentino Santiago Cange, male   DOB: 06/15/1961, 57 y.o.   MRN: [id number]  TCTS Evening Rounds:  Hemodynamically stable but BP on low side with MAP 65-70. Off neo.  Good urine output  Extubated today and doing well.

## 2018-03-15 NOTE — Progress Notes (Signed)
4 Days Post-Op Procedure(s) (LRB): AORTIC VALVE REPLACEMENT (AVR) USING 21 MM MAGNA EASE PERICARDIAL BIOPROSTHESIS-AORTIC, MODEL 3300TFX, SERIAL # [id number] (N/A) VIDEO BRONCHOSCOPY (N/A) Subjective: Acute lung injury  - AV endocarditis and AVR CXR not improved- con lasix, antibiotics- sputum culture, ESR Cont tube feeds and PS weaning Objective: Vital signs in last 24 hours: Temp:  [98.1 F (36.7 C)-100.9 F (38.3 C)] 99.5 F (37.5 C) (04/12 0700) Pulse Rate:  [46-99] 80 (04/12 0700) Cardiac Rhythm: Atrial paced (04/12 0400) Resp:  [10-38] 22 (04/12 0700) BP: (69-126)/(51-98) 93/70 (04/12 0700) SpO2:  [92 %-100 %] 98 % (04/12 0700) FiO2 (%):  [40 %-50 %] 40 % (04/12 0400) Weight:  [160 lb 4.4 oz (72.7 kg)] 160 lb 4.4 oz (72.7 kg) (04/12 0545)  Hemodynamic parameters for last 24 hours: CVP:  [11 mmHg-13 mmHg] 12 mmHg  Intake/Output from previous day: 04/11 0701 - 04/12 0700 In: 3542.4 [I.V.:1287.4; NG/GT:1905; IV Piggyback:350] Out: 2180 [Urine:2180] Intake/Output this shift: No intake/output data recorded.  Sedated on vent A-pacing, w/o AI murmur  Lab Results: Recent Labs    03/14/18 0445 03/15/18 0359  WBC 9.6 9.1  HGB 9.0* 9.7*  HCT 27.8* 30.1*  PLT 170 206   BMET:  Recent Labs    03/14/18 0445 03/15/18 0359  NA 136 136  K 3.7 3.8  CL 105 105  CO2 25 25  GLUCOSE 126* 160*  BUN 12 12  CREATININE 0.56* 0.55*  CALCIUM 7.5* 7.7*    PT/INR: No results for input(s): LABPROT, INR in the last 72 hours. ABG    Component Value Date/Time   PHART 7.394 03/15/2018 0344   HCO3 24.5 03/15/2018 0344   TCO2 26 03/15/2018 0344   O2SAT 96.0 03/15/2018 0344   CBG (last 3)  Recent Labs    03/14/18 2007 03/14/18 2350 03/15/18 0350  GLUCAP 105* 110* 138*    Assessment/Plan: S/P Procedure(s) (LRB): AORTIC VALVE REPLACEMENT (AVR) USING 21 MM MAGNA EASE PERICARDIAL BIOPROSTHESIS-AORTIC, MODEL 3300TFX, SERIAL # [id number] (N/A) VIDEO BRONCHOSCOPY  (N/A) Diuresis check co-ox   LOS: 6 days    John Byrd 03/15/2018

## 2018-03-15 NOTE — Progress Notes (Signed)
Changed by MD ?

## 2018-03-15 NOTE — Plan of Care (Signed)
Patient progressing, extubated today. Coughing strongly, expectorating tan secretions. Having multiple stools, liquid. Complaining of cramping. Tube feeding now off, OG out with extubation. Awaiting SLP to  do a swallow evaluation since paitent was intubated a prolonged period of time.

## 2018-03-15 NOTE — Progress Notes (Signed)
Patient evaluated in the afternoon post extubation, appears very comfortable and protecting his airway.   PCCM will sign off, please call back if needed.  Alyson ReedyWesam G. Yacoub, M.D. [facility] Pulmonary/Critical Care Medicine. Pager: [phone]. After hours pager: [phone].

## 2018-03-15 NOTE — Procedures (Signed)
Extubation Procedure Note  Patient Details:   Name: John Byrd DOB: September 26, 1961 MRN: [id number]   Airway Documentation:   Patient extubated per orders. Pt had positive cuff leak prior to extubation. Placed on 4 lpm humidified oxygen. RN at bedside at this time. Pt has strong cough and is able to voice.  Evaluation  O2 sats: stable throughout Complications: No apparent complications Patient did tolerate procedure well. Bilateral Breath Sounds: Rhonchi   Yes  Suszanne ConnersLaura P Kadien Lineman 03/15/2018, 12:27 PM

## 2018-03-15 NOTE — Progress Notes (Signed)
ID physician in to see, translator used to ask questions.  MD is aware of loose stools. Patient states not nauseous, does not feel like vomiting, have a little cramping. Likely cause is days of dulcolax, colace and tube feedings, will continue to monitor.

## 2018-03-15 NOTE — Progress Notes (Signed)
Pharmacy Antibiotic Note  John Byrd is a 57 y.o. male admitted on 03/09/2018 with AV endocarditis, now s/p tissue AVR on 4/8. Patient continues on vancomycin and ceftriaxone.  Tmax 100.9 overnight, wbc 9, scr normal.   Tissue was sent out for PCR testing with 2 week turn around. ID planning to continue current course of 6 weeks of vancomycin and ceftriaxone. Will adjust vancomycin dosing to q12 hours to help with outpatient administration and plan on rechecking level Monday on new dose. No changes for ceftriaxone.   Plan: Vancomycin adjusted to 1250 q12 hours -check trough Monday Continue ceftriaxone 2g IV q24h F/u renal function, culture/pathology results, de-escalation, LOT  Height: 5\' 3"  (160 cm) Weight: 160 lb 4.4 oz (72.7 kg) IBW/kg (Calculated) : 56.9  Temp (24hrs), Avg:99 F (37.2 C), Min:98.1 F (36.7 C), Max:100.9 F (38.3 C)  Recent Labs  Lab 03/09/18 1849  03/12/18 0755 03/12/18 1100 03/12/18 1629 03/12/18 1635 03/13/18 0341 03/14/18 0445 03/15/18 0359  WBC 16.7*   < > 12.0*  --  12.1*  --  11.0* 9.6 9.1  CREATININE 0.71   < > 0.64  --  0.69 0.50* 0.65 0.56* 0.55*  LATICACIDVEN 1.1  --   --   --   --   --   --   --   --   VANCOTROUGH  --   --   --  18  --   --   --   --   --    < > = values in this interval not displayed.    Estimated Creatinine Clearance: 91.1 mL/min (A) (by C-G formula based on SCr of 0.55 mg/dL (L)).    No Known Allergies  Antimicrobials this admission: Azithro 4/6 x 1 CTX 4/6 >> Vanc 4/6 >>  Dose adjustments this admission: 4/9 VT = 18 >> no changes  Microbiology results: 4/6 BCx x 3: NG 4/8 AV Cx: NGTD 4/8 AV fungal: ngtd 4/8 AV acid fast: ngtd  Thank you for allowing pharmacy to be a part of this patient's care.  Sheppard CoilFrank Wilson PharmD., BCPS Clinical Pharmacist 03/15/2018 9:43 AM

## 2018-03-15 NOTE — Progress Notes (Signed)
Still asking for bedpan, but having just small liquid stools, still cramping. Having this last night also, discussed tube feeding, is now off, hopefully will lessen. No nausea or vomiting. wil continue to observe for any increases in pain or n&V.

## 2018-03-15 NOTE — Progress Notes (Signed)
Translator service use to complete PM assessment.  Patinet answers orientation questions correctly,  Report no pain, and does not have any questions at this time.  Had translator assure him this service was available at his request.    Jacqulyn Canehristopher Scott Ziyonna Christner RN, BSN, CCRN

## 2018-03-15 NOTE — Progress Notes (Addendum)
PULMONARY / CRITICAL CARE MEDICINE   Name: John Byrd MRN: [id number] DOB: July 26, 1961    ADMISSION DATE:  03/09/2018 CONSULTATION DATE:  4/6/419  REFERRING MD:  Enedina Finner  CHIEF COMPLAINT:  dyspnea  HISTORY OF PRESENT ILLNESS:   57 year old male nonsmoker with no known past medical history who presents 4/6 with 3 weeks of progressive dyspnea, lower extremity edema and nonproductive cough.  He had a chest x-ray performed 1 week prior to admit for this complaint which showed multifocal airspace opacities however he has not followed up for additional care for this.  He denies fevers, night sweats, weight loss, chest pain, nausea, vomiting, diarrhea, myalgias, sick contacts.  No sore throat.  Did report that he was having occasional chills a couple of months ago that improved with taking over-the-counter medicine.  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  He is from [place] though his girlfriend lives here.    In the emergency department he was noted to be acutely tachypneic, oxygen saturation in the 80s on room air which did not improve with a nonrebreather and so he was started on BiPAP and given a dose of Lasix, ceftriaxone and azithromycin.    4/8- patient found to have significant aortic regurgitation and large embolus. Did not improve with conservative treatment. Currently s/p aortic valve replacement   SUBJECTIVE:  Awake and following commands , on precedex, weaning on 40%, % Peep, 10 PS, weaning Neo  VITAL SIGNS: BP 93/70   Pulse 80   Temp 99.5 F (37.5 C)   Resp (!) 22   Ht 5\' 3"  (1.6 m)   Wt 160 lb 4.4 oz (72.7 kg)   SpO2 98%   BMI 28.39 kg/m   HEMODYNAMICS: CVP:  [11 mmHg-13 mmHg] 12 mmHg  VENTILATOR SETTINGS: Vent Mode: PRVC FiO2 (%):  [40 %-50 %] 40 % Set Rate:  [20 bmp] 20 bmp Vt Set:  [460 mL] 460 mL PEEP:  [5 cmH20] 5 cmH20 Plateau Pressure:  [7 cmH20-20 cmH20] 7  cmH20  INTAKE / OUTPUT: I/O last 3 completed shifts: In: 4969.6 [I.V.:1814.6; NG/GT:2505; IV Piggyback:650] Out: 2730 [Urine:2730]  PHYSICAL EXAMINATION: General: Awake , alert, following commands, weaning, on precedex Neuro: MAE x 4, Follows commands, alert and interactive despite sedation Cardiovascular: S1, S2, RRR, click noted, trace LE edema Lungs:Coarse throughout, diminished per bases, bilateral excursion Abdomen: Soft, Non-tender, BS +, BM 4/13 Musculoskeletal: 1+ edema bilaterally, no obvious deformities  LABS:  BMET Recent Labs  Lab 03/13/18 0341 03/14/18 0445 03/15/18 0359  NA 135 136 136  K 3.8 3.7 3.8  CL 100* 105 105  CO2 26 25 25   BUN 10 12 12   CREATININE 0.65 0.56* 0.55*  GLUCOSE 104* 126* 160*    Electrolytes Recent Labs  Lab 03/13/18 0341  03/14/18 0445 03/14/18 1818 03/15/18 0359  CALCIUM 7.5*  --  7.5*  --  7.7*  MG  --    < > 2.1 2.0 2.0  PHOS  --    < > 3.7 3.7 3.5   < > = values in this interval not displayed.    CBC Recent Labs  Lab 03/13/18 0341 03/14/18 0445 03/15/18 0359  WBC 11.0* 9.6 9.1  HGB 8.9* 9.0* 9.7*  HCT 28.0* 27.8* 30.1*  PLT 134* 170 206    Coag's Recent Labs  Lab 03/10/18 1854 03/11/18 1208  APTT 34 36  INR 1.28 1.78    Sepsis Markers Recent Labs  Lab 03/09/18 1849 03/10/18 0928 03/11/18 0304  LATICACIDVEN 1.1  --   --   PROCALCITON 0.29 1.01 0.84    ABG Recent Labs  Lab 03/12/18 1303 03/13/18 1216 03/15/18 0344  PHART 7.481* 7.439 7.394  PCO2ART 32.8 37.5 40.2  PO2ART 53.0* 100.0 83.0    Liver Enzymes Recent Labs  Lab 03/11/18 0304 03/14/18 0445 03/15/18 0359  AST 44* 29 40  ALT 79* 35 34  ALKPHOS 129* 138* 156*  BILITOT 1.5* 1.1 0.8  ALBUMIN 1.6* 1.9* 1.8*    Cardiac Enzymes No results for input(s): TROPONINI, PROBNP in the last 168 hours.  Glucose Recent Labs  Lab 03/14/18 1243 03/14/18 1629 03/14/18 2007 03/14/18 2350 03/15/18 0350 03/15/18 0809  GLUCAP 117* 121*  105* 110* 138* 114*    Imaging Dg Chest Port 1 View  Result Date: 03/15/2018 CLINICAL DATA:  Status post aortic valve replacement EXAM: PORTABLE CHEST 1 VIEW COMPARISON:  03/14/2018 FINDINGS: Cardiac shadow is stable. Postoperative changes are again seen. Endotracheal tube, nasogastric catheter and right jugular central line are again seen and stable. Diffuse bilateral airspace disease is identified stable from the previous exam. No pneumothorax is seen. No acute bony abnormality is noted. IMPRESSION: Stable bilateral infiltrates. Electronically Signed   By: Alcide CleverMark  Lukens M.D.   On: 03/15/2018 08:08     STUDIES:  2D echo 4/6>>> severe AI, aortic valve vegetation   CULTURES: BC x 2 4/6>>> BC x 2 4/7>>> AFB 4/8>>Negative x 1 Surgical Culture Aortic valve 4/8>>  Fungal aortic valve leaflets 4/8 RVP 4/6>>> NEG   ANTIBIOTICS: Vanc 4/6>>> Rocephin 4/6>>> Azithro 4/6>>>  SIGNIFICANT EVENTS: 4/9 Aortic Valve Replacement  LINES/TUBES: ETT 4/6>>> R IJ CVL 4/6>>>  DISCUSSION: 57 year old male with no known history admitted 4/7 with acute respiratory failure, found to have acute diastolic CHF due to aortic insufficiency in the setting of subacute bacterial endocarditis.  ASSESSMENT / PLAN:  PULMONARY Ventilator dependent respiratory failure secondary to Acute hypoxemic respiratory failure secondary to severe aortic valve regurgitation  Tolerating CPAP/PS 4/9 after lasix Currently weaning on 5 PEEP and 10 PS + 1700 cc's CXR with stable bilateral infiltrates Required Precedex 4/13 early am for breath stacking on vent P:   Drop PS to 5/5 and wean as able Saturation goal > 94% CXR/ABG prn Consider Lasix ( BP soft / weaning Neo) Minimize sedation   CARDIOVASCULAR Acute diastolic heart failure secondary to aortic valve regurgitation secondary to valve endocarditis s/p bioprosthetic valve replacement  Aortic insufficiency Subacute bacterial endocarditis-aortic  valve Shock-cardiogenic Co-0x 63 on 4/12 P:  Weaning inotropes, neo is down to 5-10 Cardiology and CTS following Tele monitoring MAP goal > 65 EKG prn CVP changed to Q shift, last was 10 on 4/10 AM Co-ox prn   RENAL Hyponatremia- resolved  P:   Follow BMET daily  and Urine output Replace electrolytes as needed Strict I/O Consider Lasix dosing on daily basis Maintain renal perfusion  GASTROINTESTINAL Elevated LFTs-likely secondary to hepatic congestion - resolved  P:   TF at goal Famotidine PPX  HEMATOLOGIC Anemia P:  Trend CBC Transfuse PRBC's  per TCTS Lovenox for DVT proph Monitor for any obvious bleeding  INFECTIOUS Subacute bacterial endocarditis.  No known indwelling hardware or history of IV drug abuse. T max 100.9 P:   Cultures pending-negative to date ID following, so far rocephin/vanc for 6 wks until PCR results are back Continue antibiotics as above  Trend fever/ WBC Re- Culture as is clinically indicated  ENDOCRINE No active issue  P:   Monitor glucose on chemistries  NEUROLOGIC Sedation needs on vent P:   RASS goal: -1 Precedex gtt>> wean as able Tramadol added with hope to wean Precedex Daily wake up assessment Minimize sedation as able   FAMILY  No family bedside 4/12   Bevelyn Ngo, AGACNP-BC [facility] Pulmonary/Critical Care Medicine. Pager: [phone].  Attending Note:  57 year old male s/p AVF, pulmonary edema, respiratory failure that is now diuresing and weaning very well on exam.  Lungs with coarse BS.  I reviewed CXR myself, some residual edema noted and ETT is in good position.  Discussed with PCCM-NP and RT.  Will continue diureses for now.  Replace electrolytes.  Proceed with extubation today.  SLP.  Reintubate if needs be.  Ambulate as able.  D/C precedex and hopefully can come off pressors as precedex is off.  PCCM will evaluate again in the afternoon.  The patient is critically ill with multiple organ systems  failure and requires high complexity decision making for assessment and support, frequent evaluation and titration of therapies, application of advanced monitoring technologies and extensive interpretation of multiple databases.   Critical Care Time devoted to patient care services described in this note is  35  Minutes. This time reflects time of care of this signee Dr Koren Bound. This critical care time does not reflect procedure time, or teaching time or supervisory time of PA/NP/Med student/Med Resident etc but could involve care discussion time.  Alyson Reedy, M.D. [facility] Pulmonary/Critical Care Medicine. Pager: [phone]. After hours pager: [phone].

## 2018-03-15 NOTE — Progress Notes (Signed)
Use of interpreter phone to talk to patient and So about progress and extubation. patient is having diarrhea. callin gfor bedpan,  precedex off. RR even 20's

## 2018-03-16 ENCOUNTER — Inpatient Hospital Stay (HOSPITAL_COMMUNITY): Payer: Self-pay

## 2018-03-16 LAB — GLUCOSE, CAPILLARY
GLUCOSE-CAPILLARY: 88 mg/dL (ref 65–99)
GLUCOSE-CAPILLARY: 94 mg/dL (ref 65–99)
Glucose-Capillary: 84 mg/dL (ref 65–99)
Glucose-Capillary: 85 mg/dL (ref 65–99)
Glucose-Capillary: 89 mg/dL (ref 65–99)

## 2018-03-16 LAB — CBC
HCT: 28.9 % — ABNORMAL LOW (ref 39.0–52.0)
Hemoglobin: 9 g/dL — ABNORMAL LOW (ref 13.0–17.0)
MCH: 27.9 pg (ref 26.0–34.0)
MCHC: 31.1 g/dL (ref 30.0–36.0)
MCV: 89.5 fL (ref 78.0–100.0)
Platelets: 231 10*3/uL (ref 150–400)
RBC: 3.23 MIL/uL — ABNORMAL LOW (ref 4.22–5.81)
RDW: 17.5 % — ABNORMAL HIGH (ref 11.5–15.5)
WBC: 7.7 10*3/uL (ref 4.0–10.5)

## 2018-03-16 LAB — POCT I-STAT 4, (NA,K, GLUC, HGB,HCT)
Glucose, Bld: 102 mg/dL — ABNORMAL HIGH (ref 65–99)
HEMATOCRIT: 29 % — AB (ref 39.0–52.0)
Hemoglobin: 9.9 g/dL — ABNORMAL LOW (ref 13.0–17.0)
Potassium: 3.5 mmol/L (ref 3.5–5.1)
Sodium: 141 mmol/L (ref 135–145)

## 2018-03-16 LAB — COMPREHENSIVE METABOLIC PANEL
ALT: 44 U/L (ref 17–63)
AST: 71 U/L — ABNORMAL HIGH (ref 15–41)
Albumin: 2.4 g/dL — ABNORMAL LOW (ref 3.5–5.0)
Alkaline Phosphatase: 150 U/L — ABNORMAL HIGH (ref 38–126)
Anion gap: 9 (ref 5–15)
BUN: 10 mg/dL (ref 6–20)
CO2: 24 mmol/L (ref 22–32)
Calcium: 7.8 mg/dL — ABNORMAL LOW (ref 8.9–10.3)
Chloride: 103 mmol/L (ref 101–111)
Creatinine, Ser: 0.58 mg/dL — ABNORMAL LOW (ref 0.61–1.24)
GFR calc Af Amer: >60 mL/min (ref >60)
GFR calc non Af Amer: >60 mL/min (ref >60)
Glucose, Bld: 95 mg/dL (ref 65–99)
Potassium: 3.5 mmol/L (ref 3.5–5.1)
Sodium: 136 mmol/L (ref 135–145)
Total Bilirubin: 1.2 mg/dL (ref 0.3–1.2)
Total Protein: 6.7 g/dL (ref 6.5–8.1)

## 2018-03-16 LAB — COOXEMETRY PANEL
Carboxyhemoglobin: 1.4 % (ref 0.5–1.5)
Methemoglobin: 1.2 % (ref 0.0–1.5)
O2 Saturation: 64.4 %
Total hemoglobin: 12.3 g/dL (ref 12.0–16.0)

## 2018-03-16 LAB — AEROBIC/ANAEROBIC CULTURE W GRAM STAIN (SURGICAL/DEEP WOUND): Culture: NO GROWTH

## 2018-03-16 LAB — MAGNESIUM: MAGNESIUM: 1.9 mg/dL (ref 1.7–2.4)

## 2018-03-16 LAB — AEROBIC/ANAEROBIC CULTURE (SURGICAL/DEEP WOUND)

## 2018-03-16 LAB — PHOSPHORUS: Phosphorus: 3.4 mg/dL (ref 2.5–4.6)

## 2018-03-16 MED ORDER — POTASSIUM CHLORIDE CRYS ER 20 MEQ PO TBCR
40.0000 meq | EXTENDED_RELEASE_TABLET | Freq: Two times a day (BID) | ORAL | Status: AC
  Administered 2018-03-16 (×2): 40 meq via ORAL
  Filled 2018-03-16 (×2): qty 2

## 2018-03-16 NOTE — Progress Notes (Signed)
Pt sat in chair x 3 hrs. Pt ambulated in hall 190 ft with avasys walker. Patient wanted to turn around because he felt dizzy. Patient on 3 LNC. Patient now resting comfortably in chair. Pt states "no pain at all." Will continue to monitor closely.  Delories HeinzMelissa Rolf Fells, RN

## 2018-03-16 NOTE — Progress Notes (Signed)
5 Days Post-Op Procedure(s) (LRB): AORTIC VALVE REPLACEMENT (AVR) USING 21 MM MAGNA EASE PERICARDIAL BIOPROSTHESIS-AORTIC, MODEL 3300TFX, SERIAL # [id number] (N/A) VIDEO BRONCHOSCOPY (N/A) Subjective: Feels ok  Has been having some loose stools.  Objective: Vital signs in last 24 hours: Temp:  [98.6 F (37 C)-99.9 F (37.7 C)] 99.1 F (37.3 C) (04/13 1200) Pulse Rate:  [77-101] 85 (04/13 1200) Cardiac Rhythm: Atrial paced;Normal sinus rhythm (04/12 2100) Resp:  [3-26] 23 (04/13 1200) BP: (71-119)/(35-83) 114/75 (04/13 1200) SpO2:  [90 %-100 %] 94 % (04/13 1200) Weight:  [72.7 kg (160 lb 4.4 oz)] 72.7 kg (160 lb 4.4 oz) (04/13 0439)  Hemodynamic parameters for last 24 hours: CVP:  [2 mmHg-12 mmHg] 2 mmHg  Intake/Output from previous day: 04/12 0701 - 04/13 0700 In: 1689.8 [P.O.:120; I.V.:509.8; NG/GT:360; IV Piggyback:700] Out: 3410 [Urine:3410] Intake/Output this shift: Total I/O In: 190 [P.O.:150; I.V.:40] Out: 620 [Urine:620]  General appearance: alert and cooperative Neurologic: intact Heart: regular rate and rhythm, S1, S2 normal, 2/6 systolic murmur LSB. Lungs: rhonchi bilaterally Abdomen: soft, non-tender; bowel sounds normal; no masses,  no organomegaly Extremities: edema mild Wound: incision ok  Lab Results: Recent Labs    03/15/18 0359 03/16/18 0441  WBC 9.1 7.7  HGB 9.7* 9.0*  HCT 30.1* 28.9*  PLT 206 231   BMET:  Recent Labs    03/15/18 0359 03/16/18 0441  NA 136 136  K 3.8 3.5  CL 105 103  CO2 25 24  GLUCOSE 160* 95  BUN 12 10  CREATININE 0.55* 0.58*  CALCIUM 7.7* 7.8*    PT/INR: No results for input(s): LABPROT, INR in the last 72 hours. ABG    Component Value Date/Time   PHART 7.394 03/15/2018 0344   HCO3 24.5 03/15/2018 0344   TCO2 26 03/15/2018 0344   O2SAT 64.4 03/16/2018 0450   CBG (last 3)  Recent Labs    03/16/18 0354 03/16/18 0805 03/16/18 1210  GLUCAP 84 85 89   CLINICAL DATA:  Respiratory failure  EXAM: PORTABLE  CHEST 1 VIEW  COMPARISON:  March 15, 2018  FINDINGS: No pneumothorax. Bilateral pulmonary infiltrates are stable on the left and increased in the right base in the interval. The cardiomediastinal silhouette is stable. The ET and NG tubes have been removed. The right IJ is stable. The vascular sheath in the left subclavian is stable. No other interval changes.  IMPRESSION: Bilateral pulmonary infiltrates, increased in the right base but otherwise stable. Support apparatus as above.   Electronically Signed   By: Gerome Samavid  Williams III M.D   On: 03/16/2018 07:43  Assessment/Plan: S/P Procedure(s) (LRB): AORTIC VALVE REPLACEMENT (AVR) USING 21 MM MAGNA EASE PERICARDIAL BIOPROSTHESIS-AORTIC, MODEL 3300TFX, SERIAL # [id number] (N/A) VIDEO BRONCHOSCOPY (N/A) Hemodynamically stable with Co-ox 64.4  Culture negative aortic valve endocarditis: continue antibiotics per ID.  Acute lung injury: CXR fairly stable but still shows bilateral air space disease. He is on antibiotics. Work on IS. He diuresed well yesterday and CVP is 2 this am. Will continue diuresis.  Loose stools may be due to tube feeds that were stopped yesterday, stool softeners and possibly antibiotics.  Start ambulation.   LOS: 7 days    Alleen BorneBryan K Zavier Canela 03/16/2018

## 2018-03-16 NOTE — Evaluation (Signed)
Clinical/Bedside Swallow Evaluation Patient Details  Name: John Byrd MRN: [id number] Date of Birth: 1961/05/25  Today's Date: 03/16/2018 Time: SLP Start Time (ACUTE ONLY): 0930 SLP Stop Time (ACUTE ONLY): 0938 SLP Time Calculation (min) (ACUTE ONLY): 8 min  Past Medical History:  Past Medical History:  Diagnosis Date  . CHF (congestive heart failure) (HCC)    Past Surgical History:  Past Surgical History:  Procedure Laterality Date  . AORTIC VALVE REPLACEMENT N/A 03/11/2018   Procedure: AORTIC VALVE REPLACEMENT (AVR) USING 21 MM MAGNA EASE PERICARDIAL [place] EeBIOPROSTHESIS-AORTIC, MODEL 3300TFX, SERIAL # [id number];  Surgeon: Loreli SlotHendrickson, Steven C, MD;  [facility];  Service: Open Heart Surgery;  Laterality: N/A;  . No prior surgery    . VIDEO BRONCHOSCOPY N/A 03/11/2018   Procedure: VIDEO BRONCHOSCOPY;  Surgeon: Loreli SlotHendrickson, Steven C, MD;  [facility];  Service: Open Heart Surgery;  Laterality: N/A;   HPI:  57 year old male nonsmoker with no known past medical history who presents 4/6 with 3 weeks of progressive dyspnea, lower extremity edema and nonproductive cough. He had a chest x-ray performed 1 week prior to admit for this complaint which showed multifocal airspace opacities however he has not followed up for additional care for this. 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 He is from [place] though his girlfriend lives here. 4/8- patient found to have significant aortic regurgitation and large embolus secondary to endocarditis. 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  Did not improve with conservative treatment. Currently s/p aortic valve replacement. Intubated 4/6-4/12.    Assessment / Plan / Recommendation Clinical Impression  Pt demonstrated normal swallow function. No evidence of intubation related dysphagia. Pt may begin a regular diet and thin liquids.       Aspiration Risk        Diet Recommendation Regular;Thin liquid   Medication Administration: Whole meds with liquid Supervision: Patient able to self feed    Other  Recommendations     Follow up Recommendations None      Frequency and Duration            Prognosis        Swallow Study   General HPI: 57 year old male nonsmoker with no known past medical history who presents 4/6 with 3 weeks of progressive dyspnea, lower extremity edema and nonproductive cough. He had a chest x-ray performed 1 week prior to admit for this complaint which showed multifocal airspace opacities however he has not followed up for additional care for this. 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 He is from [place] though his girlfriend lives here. 4/8- patient found to have significant aortic regurgitation and large embolus secondary to endocarditis. 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  Did not improve with conservative treatment. Currently s/p aortic valve replacement. Intubated 4/6-4/12.  Type of Study: Bedside Swallow Evaluation Previous Swallow Assessment: none Diet Prior to this Study: NPO Temperature Spikes Noted: No Respiratory Status: Room air History of Recent Intubation: No Behavior/Cognition: Alert;Cooperative;Pleasant mood Oral Cavity Assessment: Within Functional Limits Oral Care Completed by SLP: No Oral Cavity - Dentition: Adequate natural dentition Vision: Functional for self-feeding Self-Feeding Abilities: Able to feed self Patient Positioning: Upright in bed Baseline Vocal Quality: Normal Volitional Cough: Strong Volitional Swallow: Able to elicit    Oral/Motor/Sensory Function     Ice Chips     Thin Liquid Thin Liquid:  Within functional limits Presentation: Cup;Straw    Nectar Thick Nectar Thick Liquid: Not tested   Honey Thick Honey Thick Liquid: Not tested   Puree Puree: Within functional limits    Solid   GO   Solid: Within functional limits       [facility], MA CCC-SLP [phone]  Claudine Mouton 03/16/2018,9:43 AM

## 2018-03-16 NOTE — Progress Notes (Signed)
Patient ID: John Byrd, male   DOB: 1961-06-25, 57 y.o.   MRN: [id number] TCTS Evening Rounds:  Hemodynamically stable  Diuresing well  sats 96% on 3LNC  Ambulated well

## 2018-03-16 NOTE — Evaluation (Signed)
Physical Therapy Evaluation Patient Details Name: John Byrd MRN: [id number] DOB: [birth year] Today's Date: 03/16/2018   History of Present Illness  Pt is a 57 y/o male with no known past medical history who presents with 3 weeks of progressive dyspnea, lower extremity edema and nonproductive cough, found to be in acute hypoxemic respiratory failure with chest x-ray appearance of severe pulmonary edema concerning for new onset decompensated heart failure of unclear cause. Pt is s/p AVR on 4/8.    Clinical Impression  Pt presented supine in bed with HOB elevated, awake and willing to participate in therapy session. Prior to admission, pt reported that he was independent with all functional mobility and ADLs. He continues to work in [employer]. Pt currently requires min guard for bed mobility and min A for transfers. Pt limited to bed mobility and transfers only secondary to persistent dizziness. With pt on RA sitting EOB, SPO2 decreased to 88%. Supplemental O2 was reapplied (3L) with SPO2 increasing to mid 90's. All other VSS throughout. PT will continue to follow pt acutely to progress mobility as tolerated and to ensure a safe d/c home.  Video interpreter was utilized throughout.    Follow Up Recommendations Supervision/Assistance - 24 hour    Equipment Recommendations  Rolling walker with 5" wheels    Recommendations for Other Services       Precautions / Restrictions Precautions Precautions: Fall;Sternal Precaution Booklet Issued: No Restrictions Weight Bearing Restrictions: No      Mobility  Bed Mobility Overal bed mobility: Needs Assistance Bed Mobility: Rolling;Sidelying to Sit Rolling: Min guard Sidelying to sit: Min guard       General bed mobility comments: pt maintaining sternal precautions throughout with occasional cueing; min guard for safety  Transfers Overall transfer level: Needs assistance Equipment used: 2 person hand held  assist Transfers: Sit to/from [employer] Sit to Stand: Min assist Stand pivot transfers: Min assist       General transfer comment: assist for stability with movement, cueing to not push with UEs  Ambulation/Gait             General Gait Details: deferred secondary to +dizziness that did not dissipate with time; pt's RN in room and aware (okay with him in chair)  Stairs            Wheelchair Mobility    Modified Rankin (Stroke Patients Only)       Balance Overall balance assessment: Needs assistance Sitting-balance support: Feet supported Sitting balance-Leahy Scale: Fair Sitting balance - Comments: pt able to sit statically at EOB with min guard to supervision   Standing balance support: During functional activity Standing balance-Leahy Scale: Poor Standing balance comment: min A for mild instability                             Pertinent Vitals/Pain Pain Assessment: Faces Faces Pain Scale: Hurts little more Pain Location: incision site Pain Descriptors / Indicators: Guarding Pain Intervention(s): Monitored during session;Repositioned    Home Living Family/patient expects to be discharged to:: Private residence Living Arrangements: Spouse/significant other Available Help at Discharge: Family;Friend(s);Available PRN/intermittently Type of Home: House Home Access: Stairs to enter   Entrance Stairs-Number of Steps: 6 Home Layout: One level Home Equipment: None      Prior Function Level of Independence: Independent         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[employer], counselling  Extremity/Trunk Assessment   Upper Extremity Assessment Upper Extremity Assessment: Overall WFL for tasks assessed    Lower Extremity Assessment Lower Extremity Assessment: Overall WFL for tasks assessed       Communication   Communication: No difficulties  Cognition Arousal/Alertness: Awake/alert Behavior During Therapy: WFL  for tasks assessed/performed Overall Cognitive Status: Difficult to assess                                 General Comments: an interpreter was utilized, however, pt still seemed a bit confused by questions      General Comments      Exercises     Assessment/Plan    PT Assessment Patient needs continued PT services  PT Problem List Decreased activity tolerance;Decreased balance;Decreased mobility;Decreased coordination;Decreased knowledge of use of DME;Decreased safety awareness;Decreased knowledge of precautions;Cardiopulmonary status limiting activity       PT Treatment Interventions DME instruction;Gait training;Stair training;Functional mobility training;Therapeutic activities;Therapeutic exercise;Balance training;Neuromuscular re-education;Patient/family education    PT Goals (Current goals can be found in the Care Plan section)  Acute Rehab PT Goals Patient Stated Goal: return home and to independence PT Goal Formulation: With patient Time For Goal Achievement: 03/30/18 Potential to Achieve Goals: Good    Frequency Min 3X/week   Barriers to discharge        Co-evaluation               AM-PAC PT "6 Clicks" Daily Activity  Outcome Measure Difficulty turning over in bed (including adjusting bedclothes, sheets and blankets)?: A Little Difficulty moving from lying on back to sitting on the side of the bed? : A Little Difficulty sitting down on and standing up from a chair with arms (e.g., wheelchair, bedside commode, etc,.)?: Unable Help needed moving to and from a bed to chair (including a wheelchair)?: A Little Help needed walking in hospital room?: A Little Help needed climbing 3-5 steps with a railing? : A Little 6 Click Score: 16    End of Session Equipment Utilized During Treatment: Oxygen(3L [place]) Activity Tolerance: Patient limited by fatigue Patient left: in chair;with call bell/phone within reach;Other (comment)(NT present in room) Nurse  Communication: Mobility status PT Visit Diagnosis: Other abnormalities of gait and mobility (R26.89)    Time: [id number] PT Time Calculation (min) (ACUTE ONLY): 26 min   Charges:   PT Evaluation $PT Eval Moderate Complexity: 1 Mod PT Treatments $Therapeutic Activity: 8-22 mins   PT G Codes:        TruchasJennifer Clio Gerhart, PT, DPT [phone]   Alessandra BevelsJennifer M Jaymir Struble 03/16/2018, 5:01 PM

## 2018-03-17 ENCOUNTER — Inpatient Hospital Stay (HOSPITAL_COMMUNITY): Payer: Self-pay

## 2018-03-17 ENCOUNTER — Inpatient Hospital Stay: Payer: Self-pay

## 2018-03-17 LAB — BASIC METABOLIC PANEL
Anion gap: 8 (ref 5–15)
BUN: 10 mg/dL (ref 6–20)
CALCIUM: 8 mg/dL — AB (ref 8.9–10.3)
CO2: 23 mmol/L (ref 22–32)
CREATININE: 0.63 mg/dL (ref 0.61–1.24)
Chloride: 103 mmol/L (ref 101–111)
GFR calc non Af Amer: >60 mL/min (ref >60)
Glucose, Bld: 96 mg/dL (ref 65–99)
Potassium: 3.9 mmol/L (ref 3.5–5.1)
SODIUM: 134 mmol/L — AB (ref 135–145)

## 2018-03-17 LAB — CBC
HCT: 30.1 % — ABNORMAL LOW (ref 39.0–52.0)
HEMOGLOBIN: 9.8 g/dL — AB (ref 13.0–17.0)
MCH: 29.1 pg (ref 26.0–34.0)
MCHC: 32.6 g/dL (ref 30.0–36.0)
MCV: 89.3 fL (ref 78.0–100.0)
Platelets: 297 10*3/uL (ref 150–400)
RBC: 3.37 MIL/uL — AB (ref 4.22–5.81)
RDW: 17.4 % — ABNORMAL HIGH (ref 11.5–15.5)
WBC: 8.7 10*3/uL (ref 4.0–10.5)

## 2018-03-17 LAB — COOXEMETRY PANEL
Carboxyhemoglobin: 1.4 % (ref 0.5–1.5)
Methemoglobin: 1.3 % (ref 0.0–1.5)
O2 Saturation: 72.9 %
Total hemoglobin: 9.9 g/dL — ABNORMAL LOW (ref 12.0–16.0)

## 2018-03-17 MED ORDER — SODIUM CHLORIDE 0.9% FLUSH
10.0000 mL | Freq: Two times a day (BID) | INTRAVENOUS | Status: DC
  Administered 2018-03-17: 10 mL

## 2018-03-17 MED ORDER — CHLORHEXIDINE GLUCONATE CLOTH 2 % EX PADS
6.0000 | MEDICATED_PAD | Freq: Every day | CUTANEOUS | Status: DC
  Administered 2018-03-17 – 2018-03-18 (×2): 6 via TOPICAL

## 2018-03-17 MED ORDER — FUROSEMIDE 10 MG/ML IJ SOLN
40.0000 mg | Freq: Two times a day (BID) | INTRAMUSCULAR | Status: AC
  Administered 2018-03-17 (×2): 40 mg via INTRAVENOUS
  Filled 2018-03-17 (×2): qty 4

## 2018-03-17 MED ORDER — PANTOPRAZOLE SODIUM 40 MG PO TBEC
40.0000 mg | DELAYED_RELEASE_TABLET | Freq: Every day | ORAL | Status: DC
  Administered 2018-03-17 – 2018-03-19 (×3): 40 mg via ORAL
  Filled 2018-03-17 (×2): qty 1

## 2018-03-17 MED ORDER — MENTHOL 3 MG MT LOZG
1.0000 | LOZENGE | OROMUCOSAL | Status: DC | PRN
  Administered 2018-03-17: 3 mg via ORAL
  Filled 2018-03-17: qty 9

## 2018-03-17 MED ORDER — POTASSIUM CHLORIDE CRYS ER 20 MEQ PO TBCR
40.0000 meq | EXTENDED_RELEASE_TABLET | Freq: Two times a day (BID) | ORAL | Status: AC
  Administered 2018-03-17 (×2): 40 meq via ORAL
  Filled 2018-03-17 (×2): qty 2

## 2018-03-17 MED ORDER — TRAMADOL HCL 50 MG PO TABS
50.0000 mg | ORAL_TABLET | Freq: Four times a day (QID) | ORAL | Status: DC | PRN
  Administered 2018-03-17: 50 mg via ORAL
  Filled 2018-03-17: qty 1

## 2018-03-17 MED ORDER — SODIUM CHLORIDE 0.9% FLUSH: 10.0000 mL | INTRAVENOUS | Status: DC | PRN

## 2018-03-17 NOTE — Progress Notes (Signed)
Patient ID: John Byrd, male   DOB: December 03, 1961, 57 y.o.   MRN: [id number] TCTS Evening Rounds:  Hemodynamically stable  Ambulated today  Having PICC inserted.

## 2018-03-17 NOTE — Progress Notes (Signed)
Peripherally Inserted Central Catheter/Midline Placement  The IV Nurse has discussed with the patient and/or persons authorized to consent for the patient, the purpose of this procedure and the potential benefits and risks involved with this procedure.  The benefits include less needle sticks, lab draws from the catheter, and the patient may be discharged home with the catheter. Risks include, but not limited to, infection, bleeding, blood clot (thrombus formation), and puncture of an artery; nerve damage and irregular heartbeat and possibility to perform a PICC exchange if needed/ordered by physician.  Alternatives to this procedure were also discussed.  Bard Power PICC patient education guide, fact sheet on infection prevention and patient information card has been provided to patient /or left at bedside.  Translator used to interpret consent.  PICC/Midline Placement Documentation  PICC Single Lumen 03/17/18 PICC Right Basilic 36 cm 0 cm (Active)  Indication for Insertion or Continuance of Line Home intravenous therapies (PICC only) 03/17/2018  6:23 PM  Exposed Catheter (cm) 0 cm 03/17/2018  6:23 PM  Site Assessment Clean;Dry;Intact 03/17/2018  6:23 PM  Line Status Flushed;Saline locked;Blood return noted 03/17/2018  6:23 PM  Dressing Type Transparent 03/17/2018  6:23 PM  Dressing Status Clean;Dry;Intact;Antimicrobial disc in place 03/17/2018  6:23 PM  Line Care Connections checked and tightened 03/17/2018  6:23 PM  Line Adjustment (NICU/IV Team Only) No 03/17/2018  6:23 PM  Dressing Intervention New dressing 03/17/2018  6:23 PM  Dressing Change Due 03/24/18 03/17/2018  6:23 PM       Elliot Dallyiggs, Matheau Orona Wright 03/17/2018, 6:25 PM

## 2018-03-17 NOTE — Plan of Care (Signed)
  Problem: Education: Goal: Knowledge of General Education information will improve Outcome: Progressing   Problem: Health Behavior/Discharge Planning: Goal: Ability to manage health-related needs will improve Outcome: Progressing   Problem: Clinical Measurements: Goal: Ability to maintain clinical measurements within normal limits will improve Outcome: Progressing Goal: Will remain free from infection Outcome: Progressing Goal: Diagnostic test results will improve Outcome: Progressing Goal: Respiratory complications will improve Outcome: Progressing Goal: Cardiovascular complication will be avoided Outcome: Progressing   Problem: Activity: Goal: Risk for activity intolerance will decrease Outcome: Progressing   Problem: Nutrition: Goal: Adequate nutrition will be maintained Outcome: Progressing   Problem: Coping: Goal: Level of anxiety will decrease Outcome: Progressing   Problem: Elimination: Goal: Will not experience complications related to bowel motility Outcome: Progressing Goal: Will not experience complications related to urinary retention Outcome: Progressing   Problem: Pain Managment: Goal: General experience of comfort will improve Outcome: Progressing   Problem: Safety: Goal: Ability to remain free from injury will improve Outcome: Progressing   Problem: Skin Integrity: Goal: Risk for impaired skin integrity will decrease Outcome: Progressing   Problem: Education: Goal: Ability to demonstrate proper wound care will improve Outcome: Progressing Goal: Knowledge of disease or condition will improve Outcome: Progressing Goal: Knowledge of the prescribed therapeutic regimen will improve Outcome: Progressing   Problem: Activity: Goal: Risk for activity intolerance will decrease Outcome: Progressing   Problem: Cardiac: Goal: Hemodynamic stability will improve Outcome: Progressing   Problem: Clinical Measurements: Goal: Postoperative  complications will be avoided or minimized Outcome: Progressing   Problem: Respiratory: Goal: Respiratory status will improve Outcome: Progressing   Problem: Skin Integrity: Goal: Wound healing without signs and symptoms of infection Outcome: Progressing Goal: Risk for impaired skin integrity will decrease Outcome: Progressing   Problem: Urinary Elimination: Goal: Ability to achieve and maintain adequate renal perfusion and functioning will improve Outcome: Progressing   

## 2018-03-17 NOTE — Progress Notes (Signed)
6 Days Post-Op Procedure(s) (LRB): AORTIC VALVE REPLACEMENT (AVR) USING 21 MM MAGNA EASE PERICARDIAL BIOPROSTHESIS-AORTIC, MODEL 3300TFX, SERIAL # [id number] (N/A) VIDEO BRONCHOSCOPY (N/A) Subjective: Complains of sore throat  Objective: Vital signs in last 24 hours: Temp:  [97.4 F (36.3 C)-99.5 F (37.5 C)] 98.5 F (36.9 C) (04/14 0811) Pulse Rate:  [73-104] 97 (04/14 0856) Cardiac Rhythm: Normal sinus rhythm;Sinus tachycardia (04/14 0835) Resp:  [19-32] 30 (04/14 0800) BP: (96-123)/(55-87) 101/87 (04/14 0856) SpO2:  [91 %-100 %] 93 % (04/14 0800) Weight:  [69.9 kg (154 lb 1.6 oz)] 69.9 kg (154 lb 1.6 oz) (04/14 0500)  Hemodynamic parameters for last 24 hours: CVP:  [6 mmHg-11 mmHg] 8 mmHg  Intake/Output from previous day: 04/13 0701 - 04/14 0700 In: 1360 [P.O.:500; I.V.:260; IV Piggyback:600] Out: [id number] [Urine:3975; Stool:3] Intake/Output this shift: Total I/O In: -  Out: 300 [Urine:300]  General appearance: alert and cooperative Neurologic: intact Heart: regular rate and rhythm and systolic flow murmur LSB Lungs: few rales bilat Extremities: edema mild Wound: incision ok  Lab Results: Recent Labs    03/16/18 0441 03/16/18 2200 03/17/18 0239  WBC 7.7  --  8.7  HGB 9.0* 9.9* 9.8*  HCT 28.9* 29.0* 30.1*  PLT 231  --  297   BMET:  Recent Labs    03/16/18 0441 03/16/18 2200 03/17/18 0238  NA 136 141 134*  K 3.5 3.5 3.9  CL 103  --  103  CO2 24  --  23  GLUCOSE 95 102* 96  BUN 10  --  10  CREATININE 0.58*  --  0.63  CALCIUM 7.8*  --  8.0*    PT/INR: No results for input(s): LABPROT, INR in the last 72 hours. ABG    Component Value Date/Time   PHART 7.394 03/15/2018 0344   HCO3 24.5 03/15/2018 0344   TCO2 26 03/15/2018 0344   O2SAT 72.9 03/17/2018 0240   CBG (last 3)  Recent Labs    03/16/18 0805 03/16/18 1210 03/16/18 1657  GLUCAP 85 89 94   CLINICAL DATA:  Congestive heart failure. Postop from aortic valve replacement.  EXAM: PORTABLE  CHEST 1 VIEW  COMPARISON:  03/16/2018  FINDINGS: Right internal jugular central venous catheter remains in appropriate position. Left subclavian Cordis is been removed.  Stable mild cardiomegaly. Diffuse interstitial infiltrates and patchy airspace disease is again seen, with more focal area of consolidation in the right lower lobe. These findings show no significant change. Small layering right pleural effusion again noted. No evidence of pneumothorax.  IMPRESSION: No significant change in cardiomegaly, diffuse mixed interstitial and airspace disease, and small layering right pleural effusion.   Electronically Signed   By: Myles RosenthalJohn  Stahl M.D.   On: 03/17/2018 07:15  Assessment/Plan: S/P Procedure(s) (LRB): AORTIC VALVE REPLACEMENT (AVR) USING 21 MM MAGNA EASE PERICARDIAL BIOPROSTHESIS-AORTIC, MODEL 3300TFX, SERIAL # [id number] (N/A) VIDEO BRONCHOSCOPY (N/A)  He is hemodynamically stable in sinus rhythm with PAC's. Continue Lopressor  Acute lung injury: CXR stable. Continue IS, antibiotics. Sats low 90's  Culture negative endocarditis: continue antibiotics per ID. Will have PICC line placed and removed central line.  Continue ambulation and IS.  Cepacol lozenges for throat.   LOS: 8 days    Alleen BorneBryan K Ally Knodel 03/17/2018

## 2018-03-18 ENCOUNTER — Inpatient Hospital Stay (HOSPITAL_COMMUNITY): Payer: Self-pay

## 2018-03-18 LAB — MISC LABCORP TEST (SEND OUT): LABCORP TEST CODE: 16774

## 2018-03-18 LAB — CULTURE, RESPIRATORY W GRAM STAIN: Special Requests: NORMAL

## 2018-03-18 LAB — BARTONELLA ANTIBODY PANEL
B Quintana IgM: NEGATIVE titer
B henselae IgM: NEGATIVE titer
B quintana IgG: NEGATIVE titer

## 2018-03-18 LAB — VANCOMYCIN, TROUGH: Vancomycin Tr: 14 ug/mL — ABNORMAL LOW (ref 15–20)

## 2018-03-18 LAB — LEGIONELLA PNEUMOPHILA SEROGP 1 UR AG: L. pneumophila Serogp 1 Ur Ag: NEGATIVE

## 2018-03-18 LAB — BARTONELLA ANITBODY PANEL: B HENSELAE IGG: NEGATIVE {titer}

## 2018-03-18 MED ORDER — SODIUM CHLORIDE 0.9 % IV SOLN: 250.0000 mL | INTRAVENOUS | Status: DC | PRN

## 2018-03-18 MED ORDER — OXYCODONE HCL 5 MG PO TABS: 5.0000 mg | ORAL_TABLET | Freq: Four times a day (QID) | ORAL | Status: DC | PRN

## 2018-03-18 MED ORDER — MOVING RIGHT ALONG BOOK
Freq: Once | Status: AC
  Administered 2018-03-19: 04:00:00
  Filled 2018-03-18 (×3): qty 1

## 2018-03-18 MED ORDER — SODIUM CHLORIDE 0.9% FLUSH: 3.0000 mL | INTRAVENOUS | Status: DC | PRN

## 2018-03-18 MED ORDER — POTASSIUM CHLORIDE CRYS ER 20 MEQ PO TBCR
20.0000 meq | EXTENDED_RELEASE_TABLET | Freq: Every day | ORAL | Status: DC
  Administered 2018-03-18 – 2018-03-19 (×2): 20 meq via ORAL
  Filled 2018-03-18 (×2): qty 1
  Filled 2018-03-18: qty 2

## 2018-03-18 MED ORDER — ZOLPIDEM TARTRATE 5 MG PO TABS
5.0000 mg | ORAL_TABLET | Freq: Every evening | ORAL | Status: DC | PRN
Start: 2018-03-18 — End: 2018-03-19

## 2018-03-18 MED ORDER — JUVEN PO PACK
1.0000 | PACK | Freq: Two times a day (BID) | ORAL | Status: DC
  Administered 2018-03-19 (×2): 1 via ORAL
  Filled 2018-03-18 (×5): qty 1

## 2018-03-18 MED ORDER — MAGNESIUM HYDROXIDE 400 MG/5ML PO SUSP: 30.0000 mL | Freq: Every day | ORAL | Status: DC | PRN

## 2018-03-18 MED ORDER — SODIUM CHLORIDE 0.9 % IV SOLN: 1500.0000 mg | Freq: Two times a day (BID) | INTRAVENOUS | Status: DC

## 2018-03-18 MED ORDER — FUROSEMIDE 20 MG PO TABS
20.0000 mg | ORAL_TABLET | Freq: Every day | ORAL | Status: DC
  Administered 2018-03-18 – 2018-03-19 (×2): 20 mg via ORAL
  Filled 2018-03-18 (×3): qty 1

## 2018-03-18 MED ORDER — SODIUM CHLORIDE 0.9% FLUSH: 3.0000 mL | Freq: Two times a day (BID) | INTRAVENOUS | Status: DC

## 2018-03-18 NOTE — Progress Notes (Signed)
Subjective:   he feels dizzy from time to time. Antibiotics:  Anti-infectives (From admission, onward)   Start     Dose/Rate Route Frequency Ordered Stop   03/15/18 2200  doxycycline (VIBRA-TABS) tablet 100 mg     100 mg Oral Every 12 hours 03/15/18 1653     03/15/18 1200  vancomycin (VANCOCIN) 1,250 mg in sodium chloride 0.9 % 250 mL IVPB     1,250 mg 166.7 mL/hr over 90 Minutes Intravenous Every 12 hours 03/15/18 0937     03/11/18 1830  vancomycin (VANCOCIN) IVPB 1000 mg/200 mL premix  Status:  Discontinued     1,000 mg 200 mL/hr over 60 Minutes Intravenous  Once 03/11/18 1141 03/11/18 1145   03/11/18 1430  ceFAZolin (ANCEF) IVPB 2g/100 mL premix  Status:  Discontinued     2 g 200 mL/hr over 30 Minutes Intravenous Every 8 hours 03/11/18 1141 03/11/18 2023   03/11/18 0815  vancomycin (VANCOCIN) 500 mg in sodium chloride 0.9 % 100 mL IVPB     500 mg 100 mL/hr over 60 Minutes Intravenous To Surgery 03/11/18 0806 03/11/18 1225   03/11/18 0400  vancomycin (VANCOCIN) 1,250 mg in sodium chloride 0.9 % 250 mL IVPB  Status:  Discontinued     1,250 mg 166.7 mL/hr over 90 Minutes Intravenous To Surgery 03/10/18 1919 03/11/18 1140   03/11/18 0400  cefUROXime (ZINACEF) 1.5 g in sodium chloride 0.9 % 100 mL IVPB     1.5 g 200 mL/hr over 30 Minutes Intravenous To Surgery 03/10/18 1919 03/11/18 1225   03/11/18 0400  cefUROXime (ZINACEF) 750 mg in sodium chloride 0.9 % 100 mL IVPB  Status:  Discontinued     750 mg 200 mL/hr over 30 Minutes Intravenous To Surgery 03/10/18 1917 03/11/18 1140   03/10/18 1800  cefTRIAXone (ROCEPHIN) 2 g in sodium chloride 0.9 % 100 mL IVPB     2 g 200 mL/hr over 30 Minutes Intravenous Every 24 hours 03/09/18 1756     03/10/18 0400  vancomycin (VANCOCIN) IVPB 750 mg/150 ml premix  Status:  Discontinued     750 mg 150 mL/hr over 60 Minutes Intravenous Every 8 hours 03/09/18 1719 03/15/18 0937   03/09/18 1800  cefTRIAXone (ROCEPHIN) 1 g in sodium chloride  0.9 % 100 mL IVPB     1 g 200 mL/hr over 30 Minutes Intravenous  Once 03/09/18 1756 03/09/18 1925   03/09/18 1730  vancomycin (VANCOCIN) 1,500 mg in sodium chloride 0.9 % 500 mL IVPB     1,500 mg 250 mL/hr over 120 Minutes Intravenous  Once 03/09/18 1717 03/09/18 2303   03/09/18 1330  cefTRIAXone (ROCEPHIN) 1 g in sodium chloride 0.9 % 100 mL IVPB     1 g 200 mL/hr over 30 Minutes Intravenous  Once 03/09/18 1318 03/09/18 1508   03/09/18 1330  azithromycin (ZITHROMAX) 500 mg in sodium chloride 0.9 % 250 mL IVPB     500 mg 250 mL/hr over 60 Minutes Intravenous  Once 03/09/18 1318 03/09/18 1634      Medications: Scheduled Meds: . acetaminophen (TYLENOL) oral liquid 160 mg/5 mL  650 mg Per Tube Once   Or  . acetaminophen  650 mg Rectal Once  . aspirin EC  325 mg Oral Daily   Or  . aspirin  324 mg Per Tube Daily  . Chlorhexidine Gluconate Cloth  6 each Topical Daily  . Chlorhexidine Gluconate Cloth  6 each Topical Daily  . doxycycline  100 mg Oral Q12H  . enoxaparin (LOVENOX) injection  40 mg Subcutaneous QHS  . furosemide  20 mg Oral Daily  . metoprolol tartrate  12.5 mg Oral BID  . nutrition supplement (JUVEN)  1 packet Oral BID BM  . pantoprazole  40 mg Oral Daily  . potassium chloride SA  20 mEq Oral Daily  . sodium chloride flush  10-40 mL Intracatheter Q12H   Continuous Infusions: . sodium chloride 10 mL/hr at 03/17/18 1815  . cefTRIAXone (ROCEPHIN)  IV Stopped (03/17/18 1904)  . vancomycin Stopped (03/18/18 0120)   PRN Meds:.sodium chloride, menthol-cetylpyridinium, metoprolol tartrate, morphine injection, ondansetron (ZOFRAN) IV, sodium chloride flush, sodium chloride flush, traMADol    Objective: Weight change: -1 lb 1.6 oz (-0.5 kg)  Intake/Output Summary (Last 24 hours) at 03/18/2018 1230 Last data filed at 03/18/2018 1000 Gross per 24 hour  Intake 2646 ml  Output 1 ml  Net 2645 ml   Blood pressure 106/77, pulse 98, temperature 98.8 F (37.1 C), temperature  source Oral, resp. rate (!) 21, height 5\' 3"  (1.6 m), weight 153 lb (69.4 kg), SpO2 96 %. Temp:  [98.4 F (36.9 C)-98.8 F (37.1 C)] 98.8 F (37.1 C) (04/15 0829) Pulse Rate:  [78-100] 98 (04/15 1000) Resp:  [14-24] 21 (04/15 1000) BP: (78-106)/(51-77) 106/77 (04/15 1000) SpO2:  [92 %-97 %] 96 % (04/15 1000) Weight:  [153 lb (69.4 kg)] 153 lb (69.4 kg) (04/15 0500)  Physical Exam: General AO x 3, sitting in a chair. HEENT: anicteric sclera, EOMI CVS regular rate, normal r, click heard Chest: Fairly clear anteriorly Abdomen: soft  nondistended,  Extremities: no  clubbing or edema noted bilaterally Skin: no rashes, bandage intact Neuro: nonfocal  CBC:  CBC Latest Ref Rng & Units 03/17/2018 03/16/2018 03/16/2018  WBC 4.0 - 10.5 K/uL 8.7 - 7.7  Hemoglobin 13.0 - 17.0 g/dL 0.4(V) 4.0(J) 8.1(X)  Hematocrit 39.0 - 52.0 % 30.1(L) 29.0(L) 28.9(L)  Platelets 150 - 400 K/uL 297 - 231     BMET Recent Labs    03/16/18 0441 03/16/18 2200 03/17/18 0238  NA 136 141 134*  K 3.5 3.5 3.9  CL 103  --  103  CO2 24  --  23  GLUCOSE 95 102* 96  BUN 10  --  10  CREATININE 0.58*  --  0.63  CALCIUM 7.8*  --  8.0*     Liver Panel  Recent Labs    03/16/18 0441  PROT 6.7  ALBUMIN 2.4*  AST 71*  ALT 44  ALKPHOS 150*  BILITOT 1.2       Sedimentation Rate No results for input(s): ESRSEDRATE in the last 72 hours. C-Reactive Protein No results for input(s): CRP in the last 72 hours.  Micro Results: Recent Results (from the past 720 hour(s))  Respiratory Panel by PCR     Status: None   Collection Time: 03/09/18  6:04 PM  Result Value Ref Range Status   Adenovirus NOT DETECTED NOT DETECTED Final   Coronavirus 229E NOT DETECTED NOT DETECTED Final   Coronavirus HKU1 NOT DETECTED NOT DETECTED Final   Coronavirus NL63 NOT DETECTED NOT DETECTED Final   Coronavirus OC43 NOT DETECTED NOT DETECTED Final   Metapneumovirus NOT DETECTED NOT DETECTED Final   Rhinovirus / Enterovirus NOT  DETECTED NOT DETECTED Final   Influenza A NOT DETECTED NOT DETECTED Final   Influenza B NOT DETECTED NOT DETECTED Final   Parainfluenza Virus 1 NOT DETECTED NOT DETECTED Final   Parainfluenza Virus 2  NOT DETECTED NOT DETECTED Final   Parainfluenza Virus 3 NOT DETECTED NOT DETECTED Final   Parainfluenza Virus 4 NOT DETECTED NOT DETECTED Final   Respiratory Syncytial Virus NOT DETECTED NOT DETECTED Final   Bordetella pertussis NOT DETECTED NOT DETECTED Final   Chlamydophila pneumoniae NOT DETECTED NOT DETECTED Final   Mycoplasma pneumoniae NOT DETECTED NOT DETECTED Final    Comment: Performed at [facility], [address] [place], [place] [address]  MRSA PCR Screening     Status: None   Collection Time: 03/09/18  6:04 PM  Result Value Ref Range Status   MRSA by PCR NEGATIVE NEGATIVE Final    Comment:        The GeneXpert MRSA Assay (FDA approved for NASAL specimens only), is one component of a comprehensive MRSA colonization surveillance program. It is not intended to diagnose MRSA infection nor to guide or monitor treatment for MRSA infections. Performed at [facility], [address] [place], [place] [address]   Culture, blood (routine x 2)     Status: None   Collection Time: 03/09/18  6:41 PM  Result Value Ref Range Status   Specimen Description BLOOD LEFT A-LINE  Final   Special Requests   Final    BOTTLES DRAWN AEROBIC AND ANAEROBIC Blood Culture adequate volume   Culture   Final    NO GROWTH 5 DAYS Performed at [facility], [address] [place], [place] [address]    Report Status 03/14/2018 FINAL  Final  Culture, blood (routine x 2)     Status: None   Collection Time: 03/09/18  6:42 PM  Result Value Ref Range Status   Specimen Description BLOOD LEFT ANTECUBITAL  Final   Special Requests   Final    BOTTLES DRAWN AEROBIC AND ANAEROBIC Blood Culture adequate volume   Culture   Final    NO GROWTH 5 DAYS Performed at [facility],  [address] [place], [place] [address]    Report Status 03/14/2018 FINAL  Final  Culture, blood (single)     Status: None   Collection Time: 03/09/18  6:52 PM  Result Value Ref Range Status   Specimen Description BLOOD LEFT HAND  Final   Special Requests   Final    BOTTLES DRAWN AEROBIC AND ANAEROBIC Blood Culture adequate volume   Culture   Final    NO GROWTH 5 DAYS Performed at [facility], [address] [place], [place] [address]    Report Status 03/14/2018 FINAL  Final  Surgical PCR screen     Status: None   Collection Time: 03/10/18  6:56 PM  Result Value Ref Range Status   MRSA, PCR NEGATIVE NEGATIVE Final   Staphylococcus aureus NEGATIVE NEGATIVE Final    Comment: (NOTE) The Xpert SA Assay (FDA approved for NASAL specimens in patients 58 years of age and older), is one component of a comprehensive surveillance program. It is not intended to diagnose infection nor to guide or monitor treatment. Performed at [facility], [address] [place], [place] [address]   Fungus Culture With Stain     Status: None (Preliminary result)   Collection Time: 03/11/18  9:22 AM  Result Value Ref Range Status   Fungus Stain Final report  Final    Comment: (NOTE) Performed At: [facility] [address] [place], [place] [id number] Jolene SchimkeNagendra Sanjai MD OZ:[id number]:[phone]    Fungus (Mycology) Culture PENDING  Incomplete  Fungal Source AORTIC VALVE  Final    Comment: Performed at [facility], [address] [place], [place] [address]  Aerobic/Anaerobic Culture (surgical/deep wound)     Status: None   Collection Time: 03/11/18  9:22 AM  Result Value Ref Range Status   Specimen Description WOUND  Final   Special Requests AORTIC VALVE  Final   Gram Stain   Final    RARE WBC PRESENT, PREDOMINANTLY MONONUCLEAR NO ORGANISMS SEEN    Culture   Final    No growth aerobically or anaerobically. Performed at [facility], [address] [place], [place]  [address]    Report Status 03/16/2018 FINAL  Final  Acid Fast Smear (AFB)     Status: None   Collection Time: 03/11/18  9:22 AM  Result Value Ref Range Status   AFB Specimen Processing Concentration  Final   Acid Fast Smear Negative  Final    Comment: (NOTE) Performed At: [facility] [address] [place], [place] [id number] Jolene Schimke MD WG:[id number]    Source (AFB) AORTIC VALVE  Final    Comment: Performed at [facility], [address], [place], [place] [address]  Fungus Culture Result     Status: None   Collection Time: 03/11/18  9:22 AM  Result Value Ref Range Status   Result 1 Comment  Final    Comment: (NOTE) KOH/Calcofluor preparation:  no fungus observed. Performed At: [facility] [address] [place], [place] [id number] Jolene Schimke MD KG:[id number] Performed at [facility], [address], [place], [place] [address]   Culture, respiratory (NON-Expectorated)     Status: None   Collection Time: 03/15/18 12:49 PM  Result Value Ref Range Status   Specimen Description TRACHEAL ASPIRATE  Final   Special Requests Normal  Final   Gram Stain   Final    FEW WBC PRESENT,BOTH PMN AND MONONUCLEAR NO SQUAMOUS EPITHELIAL CELLS SEEN RARE GRAM POSITIVE COCCI IN PAIRS Performed at [facility], [address] [place], [place] [address]    Culture RARE CANDIDA ALBICANS  Final   Report Status 03/18/2018 FINAL  Final    Studies/Results: Dg Chest Port 1 View  Result Date: 03/18/2018 CLINICAL DATA:  Status post aortic valve replacement. EXAM: PORTABLE CHEST 1 VIEW COMPARISON:  Radiograph of March 17, 2018. FINDINGS: Stable cardiomegaly and central pulmonary vascular congestion is noted. Aortic valve prosthesis is noted. No pneumothorax or significant pleural effusion is noted. Right internal jugular catheter has been removed. Interval placement of right-sided PICC line with distal tip in expected position of the SVC. Stable diffuse  interstitial densities are noted concerning for edema or inflammation. Stable right basilar atelectasis or infiltrate is noted. Bony thorax is unremarkable. IMPRESSION: Stable bilateral lung opacities as described above. Interval placement of right-sided PICC line with distal tip in expected position of SVC. Electronically Signed   By: Lupita Raider, M.D.   On: 03/18/2018 07:36   Dg Chest Port 1 View  Result Date: 03/17/2018 CLINICAL DATA:  Congestive heart failure. Postop from aortic valve replacement. EXAM: PORTABLE CHEST 1 VIEW COMPARISON:  03/16/2018 FINDINGS: Right internal jugular central venous catheter remains in appropriate position. Left subclavian Cordis is been removed. Stable mild cardiomegaly. Diffuse interstitial infiltrates and patchy airspace disease is again seen, with more focal area of consolidation in the right lower lobe. These findings show no significant change. Small layering right pleural effusion again noted. No evidence of pneumothorax. IMPRESSION:  No significant change in cardiomegaly, diffuse mixed interstitial and airspace disease, and small layering right pleural effusion. Electronically Signed   By: Myles Rosenthal M.D.   On: 03/17/2018 07:15   [place] Ekg Site Rite  Result Date: 03/17/2018 If Site Rite image not attached, placement could not be confirmed due to current cardiac rhythm.     Assessment/Plan:  INTERVAL HISTORY: Cultures from blood and heart valve tissue no growth and final. Principal Problem:   Aortic valve endocarditis Active Problems:   Acute respiratory failure with hypoxia (HCC)   Acute decompensated heart failure (HCC)   S/P AVR   Acute pulmonary edema (HCC)    John Byrd is a 57 y.o. 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 Dorris Fetch with AV vegetation sent to micro and path, sp AVR.   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 I will send serologies for Q fever and also for Bartonella, and I will send Brucella antibody.  I will check a Legionella in the urine.  I will add doxycycline and continue  vancomycin, ceftriaxone  I have submitted paperwork to Pathology dept to have valve sent to [facility] for PCR broad ranging bacterial, fungal, AFB as well as for specifically Bartonella, Legionella, T. Whippleii   they confirmed to have appropriate specimen to send this usually the turnaround is about 2 weeks time.  Since we have not isolated an organism from cultures this patient will be treated as a culture negative endocarditis patient with vancomycin and ceftriaxone and oral doxycycline.  NOTE IT IS IMPERATIVE THAT HE ALSO GET ORAL DOXYCYCLINE WITH SUFFICIENT SUPPLY FOR 42 DAYS PRIOR TO LEAVING THE HOSPITAL. I AM CONFIDENT A SNF OR HOME HEALTH WOULD GET THE Iv ABX BUT HE NEEDS THE DOXY TOO  Diagnosis: "Culture negative endocarditis  Culture Result: Pending No Known Allergies  OPAT Orders Discharge antibiotics: Ceftriaxone 2 g IV daily 24 hours, twice daily oral doxycycline and vancomycin Per pharmacy protocol vancomycin Aim for Vancomycin trough 15-20 (unless otherwise indicated) Duration: 6 weeks End Date:  May 19th, 2019  [facility] Care Per Protocol: BIWEEKLY LABS   _x_ BMP w GFR  Labs weekly while on IV antibiotics: _x_ CBC with differential  _x_ Vancomycin trough  _x_ Please pull PIC at completion of IV antibiotics __ Please leave PIC in place until doctor has seen patient or been notified  Fax weekly labs to [phone]  Clinic Follow Up Appt:  Next 3-4 weeks with [place]   I spent greater than 35 minutes with the patient including greater than 50% of time in face to face counsel of the patient guarding the nature of his infectious endocarditis the workup we are doing and how we will plan on treating him and and in coordination of his  care.   If THE PCRS come back with a pacific organism I will tailor his antibiotics accordingly similarly with his serologies.  Otherwise we will sign off for now please call with further questions.     LOS: 9 days   Acey Lav 03/18/2018, 12:30 PM

## 2018-03-18 NOTE — Progress Notes (Signed)
Physical Therapy Treatment Patient Details Name: John Byrd MRN: [id number] DOB: [birth year] Today's Date: 03/18/2018    History of Present Illness Pt is a 57 y/o male with no known past medical history who presents with 3 weeks of progressive dyspnea, lower extremity edema and nonproductive cough, found to be in acute hypoxemic respiratory failure with chest x-ray appearance of severe pulmonary edema concerning for new onset decompensated heart failure of unclear cause. Pt is s/p AVR on 4/8.    PT Comments    Pt progressing very well with mobility. Should be able to return home from PT standpoint.   Follow Up Recommendations  No PT follow up     Equipment Recommendations  Other (comment)(Likely none needed)    Recommendations for Other Services       Precautions / Restrictions Precautions Precautions: Sternal Precaution Booklet Issued: No    Mobility  Bed Mobility               General bed mobility comments: Pt up in chair  Transfers Overall transfer level: Needs assistance Equipment used: 4-wheeled walker Transfers: Sit to/from Stand Sit to Stand: Supervision         General transfer comment: visual cues for sternal precaution  Ambulation/Gait Ambulation/Gait assistance: Supervision Ambulation Distance (Feet): 475 Feet Assistive device: 4-wheeled walker Gait Pattern/deviations: Step-through pattern;Decreased stride length   Gait velocity interpretation: >4.37 ft/sec, indicative of normal walking speed General Gait Details: Steady gait with rollator. Pt amb on RA. Unable to get SpO2 with good pleth while amb but SpO2 92% once pt back in chair and good pleth obtained.   Stairs             Wheelchair Mobility    Modified Rankin (Stroke Patients Only)       Balance Overall balance assessment: Needs assistance Sitting-balance support: Feet supported Sitting balance-Leahy Scale: Good     Standing balance support: During  functional activity;No upper extremity supported Standing balance-Leahy Scale: Fair                              Cognition Arousal/Alertness: Awake/alert Behavior During Therapy: WFL for tasks assessed/performed Overall Cognitive Status: Within Functional Limits for tasks assessed                                        Exercises      General Comments        Pertinent Vitals/Pain Pain Assessment: Faces Faces Pain Scale: No hurt    Home Living                      Prior Function            PT Goals (current goals can now be found in the care plan section) Progress towards PT goals: Progressing toward goals    Frequency    Min 3X/week      PT Plan Current plan remains appropriate    Co-evaluation              AM-PAC PT "6 Clicks" Daily Activity  Outcome Measure  Difficulty turning over in bed (including adjusting bedclothes, sheets and blankets)?: A Little Difficulty moving from lying on back to sitting on the side of the bed? : A Little Difficulty sitting down on and standing up from a chair with arms (e.g.,  wheelchair, bedside commode, etc,.)?: A Little Help needed moving to and from a bed to chair (including a wheelchair)?: A Little Help needed walking in hospital room?: A Little Help needed climbing 3-5 steps with a railing? : A Little 6 Click Score: 18    End of Session   Activity Tolerance: Patient tolerated treatment well Patient left: in chair;with call bell/phone within reach;with nursing/sitter in room Nurse Communication: Mobility status PT Visit Diagnosis: Other abnormalities of gait and mobility (R26.89)     Time: [id number] PT Time Calculation (min) (ACUTE ONLY): 10 min  Charges:  $Gait Training: 8-22 mins                    G Codes:       [facility]Simya Tercero PT [phone]    Angelina OkCary W [facility] 03/18/2018, 1:39 PM

## 2018-03-18 NOTE — Progress Notes (Signed)
Pharmacy Antibiotic Note  John Byrd is a 57 y.o. male admitted on 03/09/2018 with aortic valve endocarditis, s/p tissue valve replacement on 4/8.  Pharmacy has been consulted for continued vancomycin dosing with ceftriaxone and doxycycline.   Plan to continue abx outpatient for total of 6 weeks. Tissue has been sent out for identification with estimated turn around time of 2 weeks. WBC 8.7, afebrile, Scr 0.63. Vanc trough below goal at14 today.  Plan: Vancomycin 1500 mg IV every 12 hours.  Goal trough 15-20 mcg/mL. Continue ceftriaxone 2g every 24 hours and doxycycline 100 mg every 12 hours. Monitor Scr, culture/pathology results, de-escalation, LOT Will check trough before third dose.    Height: 5\' 3"  (160 cm) Weight: 153 lb (69.4 kg) IBW/kg (Calculated) : 56.9  Temp (24hrs), Avg:98.5 F (36.9 C), Min:98.1 F (36.7 C), Max:98.8 F (37.1 C)  Recent Labs  Lab 03/12/18 1100  03/13/18 0341 03/14/18 0445 03/15/18 0359 03/16/18 0441 03/17/18 0238 03/17/18 0239 03/18/18 1115  WBC  --    < > 11.0* 9.6 9.1 7.7  --  8.7  --   CREATININE  --    < > 0.65 0.56* 0.55* 0.58* 0.63  --   --   VANCOTROUGH 18  --   --   --   --   --   --   --  14*   < > = values in this interval not displayed.    Estimated Creatinine Clearance: 89.2 mL/min (by C-G formula based on SCr of 0.63 mg/dL).    No Known Allergies  Antimicrobials this admission: Azithro 4/6X1 CTX 4/6 >>  Vancomycin 4/6 >>  Doxycyline 4/12>>  Dose adjustments this admission: 4/9 VT=18>> no change 4/12>> adjusted from Q8 to Q12 for outpatient convenience  Microbiology results: 4/6 BCx: NG 4/8 AV CX: NGTD 4/8 AV fungal: NGTD 4/8 AV acid fast: NGTD  Thank you for allowing pharmacy to be a part of this patient's care.  Emeline GeneralWhitney Willer [place] , PharmD Candidate 03/18/2018 1:07 PM

## 2018-03-18 NOTE — Plan of Care (Signed)
Pt has been in sinus rhythm with occasional unifocal PVCs.  Blood pressure soft but asymptomatic and mentating appropriately.  Ambulates well with minimal assist.  Still trying to wean off oxygen.

## 2018-03-18 NOTE — Progress Notes (Signed)
7 Days Post-Op Procedure(s) (LRB): AORTIC VALVE REPLACEMENT (AVR) USING 21 MM MAGNA EASE PERICARDIAL BIOPROSTHESIS-AORTIC, MODEL 3300TFX, SERIAL # [id number] (N/A) VIDEO BRONCHOSCOPY (N/A) Subjective: No complaints this AM  Objective: Vital signs in last 24 hours: Temp:  [98.4 F (36.9 C)-98.7 F (37.1 C)] 98.4 F (36.9 C) (04/14 2000) Pulse Rate:  [78-101] 89 (04/15 0700) Cardiac Rhythm: Normal sinus rhythm (04/15 0400) Resp:  [14-34] 16 (04/15 0700) BP: (78-112)/(51-87) 100/71 (04/15 0700) SpO2:  [89 %-97 %] 95 % (04/15 0700) Weight:  [153 lb (69.4 kg)] 153 lb (69.4 kg) (04/15 0500)  Hemodynamic parameters for last 24 hours: CVP:  [8 mmHg] 8 mmHg  Intake/Output from previous day: 04/14 0701 - 04/15 0700 In: 2450 [P.O.:1680; I.V.:170; IV Piggyback:600] Out: 601 [Urine:601] Intake/Output this shift: No intake/output data recorded.  General appearance: alert, cooperative and no distress Neurologic: intact Heart: regular rate and rhythm and + systolic murmur Lungs: clear to auscultation bilaterally Abdomen: normal findings: soft, non-tender Wound: clean and dry  Lab Results: Recent Labs    03/16/18 0441 03/16/18 2200 03/17/18 0239  WBC 7.7  --  8.7  HGB 9.0* 9.9* 9.8*  HCT 28.9* 29.0* 30.1*  PLT 231  --  297   BMET:  Recent Labs    03/16/18 0441 03/16/18 2200 03/17/18 0238  NA 136 141 134*  K 3.5 3.5 3.9  CL 103  --  103  CO2 24  --  23  GLUCOSE 95 102* 96  BUN 10  --  10  CREATININE 0.58*  --  0.63  CALCIUM 7.8*  --  8.0*    PT/INR: No results for input(s): LABPROT, INR in the last 72 hours. ABG    Component Value Date/Time   PHART 7.394 03/15/2018 0344   HCO3 24.5 03/15/2018 0344   TCO2 26 03/15/2018 0344   O2SAT 72.9 03/17/2018 0240   CBG (last 3)  Recent Labs    03/16/18 0805 03/16/18 1210 03/16/18 1657  GLUCAP 85 89 94    Assessment/Plan: S/P Procedure(s) (LRB): AORTIC VALVE REPLACEMENT (AVR) USING 21 MM MAGNA EASE PERICARDIAL  BIOPROSTHESIS-AORTIC, MODEL 3300TFX, SERIAL # [id number] (N/A) VIDEO BRONCHOSCOPY (N/A) Plan for transfer to step-down: see transfer orders  CV- s/p AVR for severe AI due to endocarditis  BP still soft but in SR with good co-ox  Dc pacing wires  ID- on vanco and ceftriaxone for endocarditis. Has PICC in place  RESP- CXR slowly improving, good sats on [place]  RENAL- creatinine and lytes OK- PO lasix  ENDO- CBG normal, dc checks  Ambulate  Dc planning   LOS: 9 days    Loreli SlotSteven C Shireen Rayburn 03/18/2018

## 2018-03-18 NOTE — Progress Notes (Signed)
[facility]  New pt this hospitalization. [name]X if home is deemed the appropriate DC disposition. [facility] will meet with the pt and wife tomorrow for hands on teaching with the IV ABX (Vancomycin and Rocephin) to prepare for home.   If patient discharges after hours, please call [phone].   John Byrd 03/18/2018, 4:59 PM

## 2018-03-18 NOTE — Progress Notes (Signed)
Nutrition Follow-up  DOCUMENTATION CODES:   Not applicable  INTERVENTION:   Juven Fruit Punch BID, each serving provides 80kcal and 14g of protein (amino acids glutamine and arginine)  NUTRITION DIAGNOSIS:   Increased nutrient needs related to post-op healing as evidenced by estimated needs.  Ongoing  GOAL:   Patient will meet greater than or equal to 90% of their needs  Meeting   MONITOR:   PO intake, Supplement acceptance, Weight trends, I & O's, Labs  REASON FOR ASSESSMENT:   Consult Enteral/tube feeding initiation and management  ASSESSMENT:    57 yo male with no known PMH presents with 3 weeks of progressive dyspnea, LEE and cough with acute respiratory failure with acute CHF due to aortic insufficiency in setting of subacute bacterial endocarditis   4/7 TTE: severe AI, aortic valve vegetation 4/8 Aortic valve replacement 4/12 extubated  Pt's appetite back to baseline, consuming 100% on a regular diet for his last four meals. Pt had eggs, bacon, and toast this morning without any complication. Pt amendable to supplementation s/p extubation. RD to send Juven to aid with surgical wound healing.   Weight noted to decrease 23 lb since last RD visit 4/10 (176 lb to 153 lb). Pty continues to diurese with lasix. Will continue to monitor trends.   Medications reviewed and include: 20 mg lasix, lopressor, 20 mEq KCl, IV abx Labs reviewed: Na 134 (L)   Diet Order:  Diet regular Room service appropriate? Yes; Fluid consistency: Thin  EDUCATION NEEDS:   Not appropriate for education at this time  Skin:  Skin Assessment: Skin Integrity Issues: Skin Integrity Issues:: Incisions Incisions: closed chest  Last BM:  03/17/18  Height:   Ht Readings from Last 1 Encounters:  03/11/18 5\' 3"  (1.6 m)    Weight:   Wt Readings from Last 1 Encounters:  03/18/18 153 lb (69.4 kg)    Ideal Body Weight:  56.3 kg  BMI:  Body mass index is 27.1 kg/m.  Estimated  Nutritional Needs:   Kcal:  1750-1950 kcal  Protein:  85-95 g  Fluid:  >1.7 L/day    Vanessa Kickarly Timmie Dugue RD, LDN Clinical Nutrition Pager # - [phone]

## 2018-03-18 NOTE — Progress Notes (Signed)
Removed patients pacing wires at this time per MD. Pt tolerated without any complications. RN will continue to monitor.

## 2018-03-18 NOTE — Plan of Care (Signed)
  Problem: Education: Goal: Knowledge of General Education information will improve Outcome: Progressing   Problem: Activity: Goal: Risk for activity intolerance will decrease Outcome: Progressing   Problem: Activity: Goal: Risk for activity intolerance will decrease Outcome: Progressing   Problem: Cardiac: Goal: Hemodynamic stability will improve Outcome: Progressing   Problem: Respiratory: Goal: Respiratory status will improve Outcome: Progressing

## 2018-03-19 ENCOUNTER — Inpatient Hospital Stay (HOSPITAL_COMMUNITY): Payer: Self-pay

## 2018-03-19 LAB — CBC
HEMATOCRIT: 29.2 % — AB (ref 39.0–52.0)
Hemoglobin: 9.4 g/dL — ABNORMAL LOW (ref 13.0–17.0)
MCH: 28.6 pg (ref 26.0–34.0)
MCHC: 32.2 g/dL (ref 30.0–36.0)
MCV: 88.8 fL (ref 78.0–100.0)
Platelets: 340 10*3/uL (ref 150–400)
RBC: 3.29 MIL/uL — ABNORMAL LOW (ref 4.22–5.81)
RDW: 16.9 % — AB (ref 11.5–15.5)
WBC: 8.2 10*3/uL (ref 4.0–10.5)

## 2018-03-19 LAB — BASIC METABOLIC PANEL
Anion gap: 7 (ref 5–15)
BUN: 17 mg/dL (ref 6–20)
CALCIUM: 8 mg/dL — AB (ref 8.9–10.3)
CO2: 22 mmol/L (ref 22–32)
Chloride: 106 mmol/L (ref 101–111)
Creatinine, Ser: 0.7 mg/dL (ref 0.61–1.24)
GFR calc Af Amer: >60 mL/min (ref >60)
GFR calc non Af Amer: >60 mL/min (ref >60)
GLUCOSE: 113 mg/dL — AB (ref 65–99)
Potassium: 3.9 mmol/L (ref 3.5–5.1)
Sodium: 135 mmol/L (ref 135–145)

## 2018-03-19 LAB — MISC LABCORP TEST (SEND OUT): LABCORP TEST CODE: 164608

## 2018-03-19 MED ORDER — ASPIRIN 325 MG PO TBEC: 325.0000 mg | DELAYED_RELEASE_TABLET | Freq: Every day | ORAL | Status: DC

## 2018-03-19 MED ORDER — VANCOMYCIN IV (FOR PTA / DISCHARGE USE ONLY): 1500.0000 mg | Freq: Two times a day (BID) | INTRAVENOUS | 0 refills | Status: AC

## 2018-03-19 MED ORDER — OXYCODONE HCL 5 MG PO TABS: 5.0000 mg | ORAL_TABLET | Freq: Four times a day (QID) | ORAL | 0 refills | Status: AC | PRN

## 2018-03-19 MED ORDER — HEPARIN SOD (PORK) LOCK FLUSH 100 UNIT/ML IV SOLN
250.0000 [IU] | INTRAVENOUS | Status: AC | PRN
  Administered 2018-03-19: 250 [IU]

## 2018-03-19 MED ORDER — DOXYCYCLINE HYCLATE 100 MG PO TABS: 100.0000 mg | ORAL_TABLET | Freq: Two times a day (BID) | ORAL | 0 refills | Status: AC

## 2018-03-19 MED ORDER — SODIUM CHLORIDE 0.9 % IV SOLN
2.0000 g | INTRAVENOUS | Status: DC
  Administered 2018-03-19: 2 g via INTRAVENOUS
  Filled 2018-03-19: qty 20

## 2018-03-19 MED ORDER — METOPROLOL TARTRATE 25 MG PO TABS: 25.0000 mg | ORAL_TABLET | Freq: Two times a day (BID) | ORAL | 1 refills | Status: DC

## 2018-03-19 MED ORDER — VANCOMYCIN HCL 10 G IV SOLR
1500.0000 mg | Freq: Two times a day (BID) | INTRAVENOUS | Status: DC
  Administered 2018-03-19: 1500 mg via INTRAVENOUS
  Filled 2018-03-19 (×3): qty 1500

## 2018-03-19 MED ORDER — CEFTRIAXONE IV (FOR PTA / DISCHARGE USE ONLY): 2.0000 g | INTRAVENOUS | 0 refills | Status: AC

## 2018-03-19 NOTE — Plan of Care (Signed)
Care plan reviewed, patient is ready for discharge.

## 2018-03-19 NOTE — Progress Notes (Signed)
Discharge instructions were given by Spanish interperter and [facility] RN today at 1300.  Extensive instructions relating to home health antibiotics and care of the picc line.  Detailed instructions were given on administration of antibiotics to patient and spouse.  Discussed new medications and medication changes.  Discussed signs and symptoms to watch for and when to contact the physician.  Patient and spouse verbalized understanding.  Decision was made to give Rocephin and Vancomycin prior to discharge, medications given and completed.  Patient verbalized understanding of all instructions.

## 2018-03-19 NOTE — Care Management Note (Signed)
Case Management Note Donn PieriniKristi Kresta Templeman RN, BSN Unit 4E-Case Manager-- 2H coverage [phone]  Patient Details  Name: John Byrd MRN: [id number] Date of Birth: [birth year]  Subjective/Objective:  Pt admitted with resp. 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 - CM to follow for transition of care needs.   Expected Discharge Date:  03/19/18               Expected Discharge Plan:  Home w Home Health Services  In-House Referral:  NA  Discharge planning Services  CM Consult, Indigent Health Clinic, Follow-up appt scheduled, Medication Assistance, MATCH Program  Post Acute Care Choice:  Home Health Choice offered to:  Patient  DME Arranged:  N/A DME Agency:  [facility]  HH Arranged:  IV Antibiotics, RN HH Agency:  [facility]  Status of Service:  Completed, signed off  If discussed at Long Length of Stay Meetings, dates discussed:  4/16  Discharge Disposition: home/home health   Additional Comments:  03/19/18- 1120- Mihailo Sage RN, CM- noted pt for discharge home today- OPAT orders have been signed for home IV abx- [facility] needs- Pam with [facility] to meet with pt and GF at bedside for d/c teaching at 1:00 pm. Call made to [facility] for [facility]CP needs- pt has appointment for May 16 at 9:30- [facility] along with list of pharmacies to use. CM will speak with pt and GF when interpreter present at 1pm.   03/18/18- 1500- Marcellene Shivley RN, CM- noted pt will need 6 wks of IV abx, has PICC in place- pt has no insurance FC are following for financial f/u. [facility] vs pt needing [facility] for IV abx- need to  assess pt for home support and teachable caregiver in the home to be able to support transition to home with home IV abx needs.Elita Quick- Pam will see pt today to assess pt for home IV abx and make sure pt has proper support to provide IV abx to the home. CM will f/u on transition plan.    Darrold SpanWebster, Barre Aydelott Hall, RN 03/19/2018, 11:26 AM

## 2018-03-19 NOTE — Progress Notes (Addendum)
CARDIAC REHAB PHASE I   PRE:  Rate/Rhythm: 109 ST    BP: sitting 93/54    SaO2: 92 RA  MODE:  Ambulation: 790 ft   POST:  Rate/Rhythm: 120 ST    BP: sitting 107/36     SaO2: 90-93 RA  Pt feeling well. Able to walk independently long distance. He sts he was a little SOB toward end. HR up to 120 sT. Ed completed through interpreter. Good reception. Will refer to [place] [facility] however finances might be a problem. Pt has read [facility] booklet but d/c video is not working.  [id number]   Harriet MassonRandi Kristan Elvena Oyer CES, ACSM 03/19/2018 11:33 AM

## 2018-03-19 NOTE — Discharge Summary (Signed)
Physician Discharge Summary  Patient ID: John Byrd MRN: [id number] DOB/AGE: Jan 05, 1961 57 y.o.  Admit date: 03/09/2018 Discharge date: 03/19/2018  Admission Diagnoses: Aortic valve endocarditis  Discharge Diagnoses:  Principal Problem:   Aortic valve endocarditis Active Problems:   Acute respiratory failure with hypoxia (HCC)   Acute decompensated heart failure (HCC)   S/P AVR   Acute pulmonary edema [facility])   Patient Active Problem List   Diagnosis Date Noted  . Acute pulmonary edema (HCC)   . S/P AVR 03/11/2018  . 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 Acute respiratory failure with hypoxia ([place]) 03/09/2018  . Acute decompensated heart failure ([place]) 03/09/2018   HPI: Patient is a 57 year old Hispanic male admitted to the emergency department where he presented with a history of progressive shortness of breath over the previous 3 weeks.  He was emergently intubated.  He also had associated lower extremity edema.  Reportedly had some type of viral illness approximately 2-3 weeks previous to presentation.  He also had a cough but no fevers, chills or other constitutional symptoms.  He denied chest pain.  He was admitted for further evaluation and treatment for initial diagnosis of hypoxic respiratory failure.   Discharged Condition: good  Hospital Course: The patient was admitted through the emergency department.  Pulmonary critical care medicine consultation was obtained. They managed the ventilator.  There was suspected to be a component of new onset decompensated heart failure.  A transthoracic echocardiogram was obtained which revealed a significant vegetation on the aortic valve.  He is also found to have elevated LFTs which were most likely related to volume overload.  Due to these findings was felt that he had bacterial endocarditis and subsequent to this cardiothoracic surgical consultation was obtained.  He was treated aggressively with inotropic support as  well as aggressive ventilator management and he subsequently became stable enough to proceed with aortic valve replacement.  On 03/11/2018 he was taken the operating room where he underwent the below described procedure.  He tolerated it well was taken to the surgical intensive care unit in stable condition.   Postoperative hospital course: The patient has shown excellent progress postoperatively.  PA pressures improved following the aortic valve replacement.  He did require some inotropic support which was weaned over time without significant difficulty.  He has remained neurologically intact.  Infectious disease has been consulted and they are assisting with antibiotic management.  Cultures and specimen have been sent to an outside [facility] facility ([facility]  and turned over on these results is expected to be approximately 2 weeks.  Currently the patient is on intravenous vancomycin, ceftriaxone as well as p.o. doxycycline.  There will be a total of 6 weeks postoperative therapy until May 19.  Physical therapy has assisted with mobilization.  He is doing well in this regard.  He does not require further outpatient physical therapy.  Incisions are noted to be healing well without evidence of infection.  He is tolerating diet.  Oxygen has been weaned off.  He maintains good oxygen saturations on room air.  He is maintaining sinus rhythm.  Blood pressures under good control.  At time of discharge the patient is felt to be quite stable.  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  PROCEDURES:  Median  sternotomy, extracorporeal circulation aortic valve replacement with 21 mm [employer] Ease bovine pericardial valve (model #3300TFX, serial [id number]).  Bronchoscopy.  SURGEON:  Revonda Standard. Roxan Hockey, M.D.  ASSISTANT:  Nicholes Rough, PA  ANESTHESIA:  General.  FINDINGS:  TEE showed severe aortic insufficiency, dilated left ventricle, flail non-coronary cusp of the aortic valve.  No vegetations seen on other valves.  Large mobile vegetation on aortic valve.    Discharge Exam: Blood pressure 93/65, pulse (!) 105, temperature 98.1 F (36.7 C), temperature source Oral, resp. rate (!) 28, height '5\' 3"'  (1.6 m), weight 69.7 kg (153 lb 11.2 oz), SpO2 93 %.   General appearance: alert, cooperative and no distress Heart: regular rate and rhythm and soft syst murmur Lungs: clear to auscultation bilaterally Abdomen: benign Extremities: no edema Wound: incis healing well   Results for orders placed or performed during the hospital encounter of 03/09/18  Respiratory Panel by PCR     Status: None   Collection Time: 03/09/18  6:04 PM  Result Value Ref Range Status   Adenovirus NOT DETECTED NOT DETECTED Final   Coronavirus 229E NOT DETECTED NOT DETECTED Final   Coronavirus HKU1 NOT DETECTED NOT DETECTED Final   Coronavirus NL63 NOT DETECTED NOT DETECTED Final   Coronavirus OC43 NOT DETECTED NOT DETECTED Final   Metapneumovirus NOT DETECTED NOT DETECTED Final   Rhinovirus / Enterovirus NOT DETECTED NOT DETECTED Final   Influenza A NOT DETECTED NOT DETECTED Final   Influenza B NOT DETECTED NOT DETECTED Final   Parainfluenza Virus 1 NOT DETECTED NOT DETECTED Final   Parainfluenza Virus 2 NOT DETECTED NOT DETECTED Final   Parainfluenza Virus 3 NOT DETECTED NOT DETECTED Final   Parainfluenza Virus 4 NOT DETECTED NOT DETECTED Final   Respiratory Syncytial Virus NOT DETECTED NOT DETECTED Final   Bordetella pertussis NOT DETECTED NOT DETECTED Final   Chlamydophila pneumoniae NOT DETECTED  NOT DETECTED Final   Mycoplasma pneumoniae NOT DETECTED NOT DETECTED Final    Comment: Performed at [facility], [place] [address] [place], [place] [address]  MRSA PCR Screening     Status: None   Collection Time: 03/09/18  6:04 PM  Result Value Ref Range Status   MRSA by PCR NEGATIVE NEGATIVE Final    Comment:        The GeneXpert MRSA Assay (FDA approved for NASAL specimens only), is one component of a comprehensive MRSA colonization surveillance program. It is not intended to diagnose MRSA infection nor to guide or monitor treatment for MRSA infections. Performed at [facility], [place] [address], [place], [place] [address]   Culture, blood (routine x 2)     Status: None   Collection Time: 03/09/18  6:41 PM  Result Value Ref Range Status   Specimen Description BLOOD LEFT A-LINE  Final   Special Requests   Final    BOTTLES DRAWN AEROBIC AND ANAEROBIC Blood Culture adequate volume   Culture   Final    NO GROWTH 5 DAYS Performed at [facility], [address] [place], [place] [address]    Report Status 03/14/2018 FINAL  Final  Culture, blood (routine x 2)     Status: None   Collection Time: 03/09/18  6:42 PM  Result Value Ref Range Status   Specimen Description BLOOD LEFT ANTECUBITAL  Final   Special Requests   Final    BOTTLES DRAWN AEROBIC AND ANAEROBIC Blood Culture adequate volume   Culture   Final    NO GROWTH 5 DAYS Performed at [facility], [address], [place], [place] [address]    Report Status 03/14/2018 FINAL  Final  Culture, blood (single)     Status: None   Collection Time: 03/09/18  6:52 PM  Result Value Ref Range Status   Specimen Description BLOOD LEFT HAND  Final   Special Requests   Final    BOTTLES DRAWN AEROBIC AND ANAEROBIC Blood Culture adequate volume   Culture   Final    NO GROWTH 5 DAYS Performed at [facility], [place] [address] [place], [place] [address]    Report Status 03/14/2018 FINAL  Final  Surgical  PCR screen     Status: None   Collection Time: 03/10/18  6:56 PM  Result Value Ref Range Status   MRSA, PCR NEGATIVE NEGATIVE Final   Staphylococcus aureus NEGATIVE NEGATIVE Final    Comment: (NOTE) The Xpert SA Assay (FDA approved for NASAL specimens in patients 53 years of age and older), is one component of a comprehensive surveillance program. It is not intended to diagnose infection nor to guide or monitor treatment. Performed at [facility], [place] [address] [place], [place] [address]   Fungus Culture With Stain     Status: None (Preliminary result)   Collection Time: 03/11/18  9:22 AM  Result Value Ref Range Status   Fungus Stain Final report  Final    Comment: (NOTE) Performed At: [facility] [place], [place] [id number] Rush Farmer MD IO:[id number]    Fungus (Mycology) Culture PENDING  Incomplete   Fungal Source AORTIC VALVE  Final    Comment: Performed at [facility], [place] [address], [place], [place] [address]  Aerobic/Anaerobic Culture (surgical/deep wound)     Status: None   Collection Time: 03/11/18  9:22 AM  Result Value Ref Range Status   Specimen Description WOUND  Final   Special Requests AORTIC VALVE  Final   Gram Stain   Final    RARE WBC PRESENT, PREDOMINANTLY MONONUCLEAR NO ORGANISMS SEEN    Culture   Final    No growth aerobically or anaerobically. Performed at [facility], [place] [address] [place], [place] [address]    Report Status 03/16/2018 FINAL  Final  Acid Fast Smear (AFB)     Status: None   Collection Time: 03/11/18  9:22 AM  Result Value Ref Range Status   AFB Specimen Processing Concentration  Final   Acid Fast Smear Negative  Final    Comment: (NOTE) Performed At: [facility] [place], [place] [id number] Rush Farmer MD QQ:[id number]    Source (AFB) AORTIC VALVE  Final    Comment: Performed at [facility], [place] [address] [place], [place] [address]  Fungus  Culture Result     Status: None   Collection Time: 03/11/18  9:22 AM  Result Value Ref Range Status   Result 1 Comment  Final    Comment: (NOTE) KOH/Calcofluor preparation:  no fungus observed. Performed At: [facility] [place], [place] [id number] Rush Farmer MD TK:[id number] Performed at [facility], [place] [address] [place], [place] [address]   Culture, respiratory (NON-Expectorated)     Status: None   Collection Time: 03/15/18 12:49 PM  Result  Value Ref Range Status   Specimen Description TRACHEAL ASPIRATE  Final   Special Requests Normal  Final   Gram Stain   Final    FEW WBC PRESENT,BOTH PMN AND MONONUCLEAR NO SQUAMOUS EPITHELIAL CELLS SEEN RARE GRAM POSITIVE COCCI IN PAIRS Performed at [facility], [address] [place], [place] [address]    Culture RARE CANDIDA ALBICANS  Final   Report Status 03/18/2018 FINAL  Final    Disposition: Discharge disposition: 01-Home or Self Care       Discharge Instructions    Discharge patient   Complete by:  As directed    Discharge disposition:  01-Home or Self Care   Discharge patient date:  03/19/2018   Home infusion instructions [place] May follow [place] Dosing Protocol; May administer Cathflo as needed to maintain patency of vascular access device.; [place]ushing of vascular access device: per [facility] Protocol: 0.9% NaCl pre/post medica...   Complete by:  As directed    Instructions:  May follow [place] Dosing Protocol   Instructions:  May administer Cathflo as needed to maintain patency of vascular access device.   Instructions:  Flushing of vascular access device: per [facility] Protocol: 0.9% NaCl pre/post medication administration and prn patency; Heparin 100 u/ml, 17m for implanted ports and Heparin 10u/ml, 531mfor all other central venous catheters.   Instructions:  May follow [facility] Anaphylaxis Protocol for First Dose Administration in the home: 0.9% NaCl at 25-50 ml/hr to  maintain IV access for protocol meds. Epinephrine 0.3 ml IV/IM PRN and Benadryl 25-50 IV/IM PRN s/s of anaphylaxis.   Instructions:  AdHigdennfusion Coordinator (RN) to assist per patient IV care needs in the home PRN.   Home infusion instructions [facility]; May administer Cathflo as needed to maintain patency of vascular access device.; [place] [facility] vascular access [facility]: 0.9% NaCl pre/post medica...   Complete by:  As directed    Instructions:  [facility]   Instructions:  May administer Cathflo as needed to maintain patency of vascular access device.   Instructions:  [place] of vascular access device: [facility]: 0.9% NaCl pre/post medication administration and prn patency; Heparin 100 u/ml, 80m46mor implanted ports and Heparin 10u/ml, 80ml280mr all other central venous catheters.   Instructions:  May follow [facility] Anaphylaxis Protocol for First Dose Administration in the home: 0.9% NaCl at 25-50 ml/hr to maintain IV access for protocol meds. Epinephrine 0.3 ml IV/IM PRN and Benadryl 25-50 IV/IM PRN s/s of anaphylaxis.   Instructions:  AdvaLoraineusion Coordinator (RN) to assist per patient IV care needs in the home PRN.     Allergies as of 03/19/2018   No Known Allergies     Medication List    STOP taking these medications   dicyclomine 20 MG tablet Commonly known as:  BENTYL   furosemide 20 MG tablet Commonly known as:  LASIX     TAKE these medications   aspirin 325 MG EC tablet Take 1 tablet (325 mg total) by mouth daily.   cefTRIAXone IVPB Commonly known as:  ROCEPHIN Inject 2 g into the vein daily. Indication: Culture Negative endocarditis Last Day of Therapy: 04/21/18 Labs - Once weekly:  CBC/D and BMP, Labs - Every other week:  ESR and CRP   doxycycline 100 MG tablet Commonly known as:  VIBRA-TABS Take 1 tablet (100 mg total) by mouth every 12 (  twelve)  hours.   famotidine 20 MG tablet Commonly known as:  PEPCID Take 1 tablet (20 mg total) by mouth 2 (two) times daily.   metoprolol tartrate 25 MG tablet Commonly known as:  LOPRESSOR Take 1 tablet (25 mg total) by mouth 2 (two) times daily.   oxyCODONE 5 MG immediate release tablet Commonly known as:  Oxy IR/ROXICODONE Take 1-2 tablets (5-10 mg total) by mouth every 6 (six) hours as needed for up to 7 days for severe pain.   vancomycin IVPB Inject 1,500 mg into the vein every 12 (twelve) hours. Indication: Culture Negative endocarditis Last Day of Therapy: 04/21/18 Labs - 'Sunday/Monday:  CBC/D, BMP, and vancomycin trough. Labs - Thursday:  BMP and vancomycin trough Labs - Every other week:  ESR and CRP            Home Infusion Instuctions  (From admission, onward)        Start     Ordered   03/19/18 0000  Home infusion instructions [facility] May follow [facility] Dosing Protocol; May administer Cathflo as needed to maintain patency of vascular access device.; Flushing of vascular access device: per [facility] Protocol: 0.9% NaCl pre/post medica...    Question Answer Comment  Instructions May follow [facility] Dosing Protocol   Instructions May administer Cathflo as needed to maintain patency of vascular access device.   Instructions Flushing of vascular access device: per [facility] Protocol: 0.9% NaCl pre/post medication administration and prn patency; Heparin 100 u/ml, 5ml for implanted ports and Heparin 10u/ml, 5ml for all other central venous catheters.   Instructions May follow [facility] Anaphylaxis Protocol for First Dose Administration in the home: 0.9% NaCl at 25-50 ml/hr to maintain IV access for protocol meds. Epinephrine 0.3 ml IV/IM PRN and Benadryl 25-50 IV/IM PRN s/s of anaphylaxis.   Instructions [facility] Infusion Coordinator (RN) to assist per patient IV care needs in the home PRN.      03/19/18 0921   03/19/18 0000  Home infusion instructions [facility] May follow [facility] Dosing Protocol; May administer Cathflo as needed to maintain patency of vascular access device.; Flushing of vascular access device: per [facility] Protocol: 0.9% NaCl pre/post medica...    Question Answer Comment  Instructions May follow [facility] Dosing Protocol   Instructions May administer Cathflo as needed to maintain patency of vascular access device.   Instructions Flushing of vascular access device: per [facility] Protocol: 0.9% NaCl pre/post medication administration and prn patency; Heparin 100 u/ml, 5ml for implanted ports and Heparin 10u/ml, 5ml for all other central venous catheters.   Instructions May follow [facility] Anaphylaxis Protocol for First Dose Administration in the home: 0.9% NaCl at 25-50 ml/hr to maintain IV access for protocol meds. Epinephrine 0.3 ml IV/IM PRN and Benadryl 25-50 IV/IM PRN s/s of anaphylaxis.   Instructions [facility] Infusion Coordinator (RN) to assist per patient IV care needs in the home PRN.      04' /16/19 8325     Follow-up Information    [place] Nakayama, MD Follow up.   Specialty:  Cardiothoracic Surgery Why:  Appointment to see Dr. Roxan Hockey on 04/16/2018 at 11 AM.  Please obtain a chest x-ray at [facility] at 10:30 AM.  [facility] imaging is located in the same office complex on the first floor. Contact information: [address] [place]  [place] [address] [phone]        Lelon Perla, MD Follow up.   Specialty:  Cardiology Why:  An appointment is being arranged for a 2-week cardiologist follow-up appointment.  If you do not hear from their office in the next 3-4 days please contact the office and arrange this appointment. Contact information: [place] [address] [phone]          The patient has been discharged on:   1.Beta Blocker:  Yes [ y  ]                              No   [   ]                              If No, reason:  2.Ace  Inhibitor/ARB: Yes [   ]                                     No  [n    ]                                     If No, reason:no currentindication, BP runs low  3.Statin:   Yes [   ]                  No  [  n ]                  If No, reason:no CAD or hyperlipidemia dx  4.Ecasa:  Yes  [  y ]                  No   [   ]                  If No, reason:  Signed: John Giovanni 03/19/2018, 9:50 AM

## 2018-03-19 NOTE — Progress Notes (Addendum)
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, MODEL 3300TFX, SERIAL # [id number] (N/A) VIDEO BRONCHOSCOPY (N/A) Subjective: Feels well, no complaint  Objective: Vital signs in last 24 hours: Temp:  [98 F (36.7 C)-98.8 F (37.1 C)] 98.1 F (36.7 C) (04/16 0547) Pulse Rate:  [88-108] 105 (04/16 0547) Cardiac Rhythm: Sinus tachycardia (04/16 0700) Resp:  [14-31] 28 (04/16 0547) BP: (83-143)/(59-131) 93/65 (04/16 0547) SpO2:  [90 %-98 %] 93 % (04/16 0547) Weight:  [69.7 kg (153 lb 11.2 oz)] 69.7 kg (153 lb 11.2 oz) (04/16 0547)  Hemodynamic parameters for last 24 hours:    Intake/Output from previous day: 04/15 0701 - 04/16 0700 In: 1386 [P.O.:1166; I.V.:120; IV Piggyback:100] Out: 375 [Urine:375] Intake/Output this shift: No intake/output data recorded.  General appearance: alert, cooperative and no distress Heart: regular rate and rhythm and soft syst murmur Lungs: clear to auscultation bilaterally Abdomen: benign Extremities: no edema Wound: incis healing well  Lab Results: Recent Labs    03/17/18 0239 03/19/18 0312  WBC 8.7 8.2  HGB 9.8* 9.4*  HCT 30.1* 29.2*  PLT 297 340   BMET:  Recent Labs    03/17/18 0238 03/19/18 0312  NA 134* 135  K 3.9 3.9  CL 103 106  CO2 23 22  GLUCOSE 96 113*  BUN 10 17  CREATININE 0.63 0.70  CALCIUM 8.0* 8.0*    PT/INR: No results for input(s): LABPROT, INR in the last 72 hours. ABG    Component Value Date/Time   PHART 7.394 03/15/2018 0344   HCO3 24.5 03/15/2018 0344   TCO2 26 03/15/2018 0344   O2SAT 72.9 03/17/2018 0240   CBG (last 3)  Recent Labs    03/16/18 1210 03/16/18 1657  GLUCAP 89 94    Meds Scheduled Meds: . acetaminophen (TYLENOL) oral liquid 160 mg/5 mL  650 mg Per Tube Once   Or  . acetaminophen  650 mg Rectal Once  .  aspirin EC  325 mg Oral Daily   Or  . aspirin  324 mg Per Tube Daily  . Chlorhexidine Gluconate Cloth  6 each Topical Daily  . doxycycline  100 mg Oral Q12H  . enoxaparin (LOVENOX) injection  40 mg Subcutaneous QHS  . furosemide  20 mg Oral Daily  . metoprolol tartrate  12.5 mg Oral BID  . nutrition supplement (JUVEN)  1 packet Oral BID BM  . pantoprazole  40 mg Oral Daily  . potassium chloride SA  20 mEq Oral Daily  . sodium chloride flush  10-40 mL Intracatheter Q12H  . sodium chloride flush  3 mL Intravenous Q12H   Continuous Infusions: . sodium chloride    . cefTRIAXone (ROCEPHIN)  IV Stopped (03/18/18 1750)  . vancomycin     PRN Meds:.sodium chloride, magnesium hydroxide, menthol-cetylpyridinium, metoprolol tartrate, ondansetron (ZOFRAN) IV, oxyCODONE, sodium chloride flush, traMADol, zolpidem  Xrays Dg Chest Port 1 View  Result Date: 03/18/2018 CLINICAL DATA:  Status post aortic valve replacement. EXAM: PORTABLE CHEST 1 VIEW COMPARISON:  Radiograph of March 17, 2018. FINDINGS: Stable cardiomegaly and central pulmonary vascular congestion is noted. Aortic valve prosthesis is noted. No pneumothorax or significant pleural effusion is noted. Right internal jugular catheter has been removed. Interval placement of right-sided PICC line with distal tip  in expected position of the SVC. Stable diffuse interstitial densities are noted concerning for edema or inflammation. Stable right basilar atelectasis or infiltrate is noted. Bony thorax is unremarkable. IMPRESSION: Stable bilateral lung opacities as described above. Interval placement of right-sided PICC line with distal tip in expected position of SVC. Electronically Signed   By: Lupita RaiderJames  Green Jr, M.D.   On: 03/18/2018 07:36   [place] Ekg Site Rite  Result Date: 03/17/2018 If Site Rite image not attached, placement could not be confirmed due to current cardiac rhythm.   Assessment/Plan: S/P Procedure(s) (LRB): AORTIC VALVE REPLACEMENT  (AVR) USING 21 MM MAGNA EASE PERICARDIAL BIOPROSTHESIS-AORTIC, MODEL 3300TFX, SERIAL # [id number] (N/A) VIDEO BRONCHOSCOPY (N/A)    1 doing well 2 mobilizing well per PT with no home needs  3 abx plan as outlined per ID, [facility] on board for home abx with PICC 4 poss home today or tomorrow if all agree  LOS: 10 days    Rowe ClackWayne E Gold 03/19/2018 Patient seen and examined, agree with above  Viviann SpareSteven C. Dorris FetchHendrickson, MD [facility] [phone]

## 2018-03-19 NOTE — Progress Notes (Addendum)
Physical Therapy Treatment and Discharge Patient Details Name: John Byrd MRN: [id number] DOB: Mar 22, 1961 Today's Date: 03/19/2018    History of Present Illness Pt is a 57 y/o male with no known past medical history who presents with 3 weeks of progressive dyspnea, lower extremity edema and nonproductive cough, found to be in acute hypoxemic respiratory failure with chest x-ray appearance of severe pulmonary edema concerning for new onset decompensated heart failure of unclear cause. Pt is s/p AVR on 4/8.    PT Comments    Patient met their physical therapy goals. Progressed to ambulation with no AD this session; no overt LOB. Patient declined practicing steps since he said he plans to discharge home temporarily with his girlfriend who has a level entry. No further acute skilled PT needs at this time. Patient with no further questions or concerns. Sternal precautions reviewed and patient expressed/demonstrated understanding. Will sign off at this time. Please re-consult if needs change.    Follow Up Recommendations  No PT follow up     Equipment Recommendations  None recommended by PT    Recommendations for Other Services       Precautions / Restrictions Precautions Precautions: Sternal Precaution Booklet Issued: No Restrictions Other Position/Activity Restrictions: sternal precautions    Mobility  Bed Mobility               General bed mobility comments: Pt sitting EOB  Transfers   Equipment used: None Transfers: Sit to/from Stand Sit to Stand: Independent         General transfer comment: Maintained sternal precautions  Ambulation/Gait Ambulation/Gait assistance: Modified independent (Device/Increase time) Ambulation Distance (Feet): 400 Feet Assistive device: None Gait Pattern/deviations: Step-through pattern;Decreased stride length;Wide base of support     General Gait Details: Patient with steady gait with no device, incorporating a  wide BOS. Maintained adequate gait speed   Stairs             Wheelchair Mobility    Modified Rankin (Stroke Patients Only)       Balance Overall balance assessment: Needs assistance Sitting-balance support: Feet supported Sitting balance-Leahy Scale: Normal     Standing balance support: During functional activity;No upper extremity supported Standing balance-Leahy Scale: Good                              Cognition Arousal/Alertness: Awake/alert Behavior During Therapy: WFL for tasks assessed/performed Overall Cognitive Status: Within Functional Limits for tasks assessed                                        Exercises      General Comments General comments (skin integrity, edema, etc.): Patient girlfriend present during session      Pertinent Vitals/Pain Pain Assessment: Faces Faces Pain Scale: No hurt    Home Living                      Prior Function            PT Goals (current goals can now be found in the care plan section) Acute Rehab PT Goals Patient Stated Goal: return home and to independence PT Goal Formulation: With patient Time For Goal Achievement: 03/30/18 Potential to Achieve Goals: Good Progress towards PT goals: Goals met/education completed, patient discharged from PT    Frequency    Min  3X/week      PT Plan Current plan remains appropriate    Co-evaluation              AM-PAC PT "6 Clicks" Daily Activity  Outcome Measure  Difficulty turning over in bed (including adjusting bedclothes, sheets and blankets)?: None Difficulty moving from lying on back to sitting on the side of the bed? : None Difficulty sitting down on and standing up from a chair with arms (e.g., wheelchair, bedside commode, etc,.)?: None Help needed moving to and from a bed to chair (including a wheelchair)?: None Help needed walking in hospital room?: None Help needed climbing 3-5 steps with a railing? : A  Little 6 Click Score: 23    End of Session   Activity Tolerance: Patient tolerated treatment well Patient left: with call bell/phone within reach;with family/visitor present;Other (comment)(sitting EOB) Nurse Communication: Mobility status PT Visit Diagnosis: Other abnormalities of gait and mobility (R26.89)     Time: 6153-7943 PT Time Calculation (min) (ACUTE ONLY): 28 min  Charges:  $Gait Training: 23-37 mins                    G Codes:     Ellamae Sia, PT, DPT Acute Rehabilitation Services  Pager: [place] 03/19/2018, 1:14 PM

## 2018-03-19 NOTE — Plan of Care (Signed)
Care plan reviewed and patient is progressing.  

## 2018-03-19 NOTE — Progress Notes (Signed)
Sutures removed from old chest tube site.  Pt tolerated well.

## 2018-03-19 NOTE — Discharge Instructions (Signed)
Reemplazo de la vlvula artica (Aortic Valve Replacement) El reemplazo de la vlvula artica es un procedimiento quirrgico realizado para reemplazar una vlvula artica que no puede repararse. Este procedimiento se puede realizar para reemplazar lo siguiente:  Una vlvula artica estrecha (estenosis de la vlvula artica).  Una vlvula artica deformada (vlvula artica bicspide).  Una vlvula artica que no se cierra completamente (insuficiencia artica). La vlvula artica se sustituye por una vlvula artificial (protsica). Hay tres tipos de vlvulas protsicas disponibles:  Las vlvulas mecnicas hechas completamente de materiales artificiales.  Vlvulas donadas de donantes humanos. Estas solo se Engineer, production.  Las vlvulas biolgicas hechas con tejidos animales. El tipo de vlvula protsica utilizado se determinar segn diversos factores, por ejemplo, su edad, su estilo de vida y otras afecciones mdicas que tenga. Este procedimiento se realiza mediante una tcnica [place], lo que significa que se realizar una incisin grande en el pecho sobre el corazn. INFORME A SU MDICO:  Cualquier alergia que tenga.  Todos los [name], incluidos vitaminas, hierbas, gotas oftlmicas, cremas y [address] de [address].  Problemas previos que usted o los [employer] de su familia hayan tenido con el uso de anestsicos.  Enfermedades de la sangre que tenga.  Si tiene cirugas previas.  Cualquier enfermedad que tenga.  Si est embarazada o podra estarlo.  RIESGOS Y COMPLICACIONES En general, se trata de un procedimiento seguro. Sin embargo, pueden ocurrir complicaciones, por ejemplo:  Infeccin.  Hemorragia.  Reacciones alrgicas a los medicamentos.  Daos a Systems developer u otros rganos.  Cogulos sanguneos provocados por la vlvula protsica.  Falla de la vlvula protsica. ANTES DEL PROCEDIMIENTO  Consulte al mdico  si debe hacer o no lo siguiente: ? Cambiar o suspender los medicamentos que toma habitualmente. Esto es muy importante si toma medicamentos para la diabetes o anticoagulantes. ? Tomar medicamentos como aspirina e ibuprofeno. Estos medicamentos pueden tener un efecto anticoagulante en la [place]. No tome estos medicamentos antes del procedimiento si el mdico le indica que no lo haga.  Siga las indicaciones del mdico respecto de las restricciones para las comidas o las bebidas.  Pueden darle antibiticos para ayudar a prevenir las infecciones.  Pueden hacerle estudios, por ejemplo: ? Un ecocardiograma. ? Un electrocardiograma (ECG). ? Anlisis de [place]. ? Anlisis de [place].  Haga planes para que una persona lo lleve a su casa despus del alta hospitalaria.  Haga planes para que alguien permanezca con usted al [employer] las primeras 24 horas despus del alta hospitalaria.  PROCEDIMIENTO  Para reducir el riesgo de infecciones: ? El equipo mdico se lavar o se desinfectar las manos. ? Le lavarn la piel con jabn.  Le colocarn una va intravenosa (IV) en una de las venas.  Le administrarn uno o ms de los siguientes medicamentos: ? Un medicamento para ayudarlo a relajarse (sedante). ? Un medicamento que lo har dormir (anestesia general).  Lo colocarn en una mquina de circulacin extracorprea. Esta mquina proporcionar oxgeno a Advertising account planner se somete a [place].  Le realizarn una incisin en el pecho, sobre el corazn.  Se eliminar la vlvula artica daada.  Se coser una nueva vlvula en el corazn.  La incisin se cerrar con puntos (suturas), [employer] para la piel o tiras [place].  Se colocar una venda (vendaje) sobre la incisin. Este procedimiento puede variar segn el mdico y el hospital. DESPUS DEL PROCEDIMIENTO  John Byrd controlarn con frecuencia la presin arterial, la frecuencia cardaca, la frecuencia  respiratoria y Air cabin crew de  oxgeno en la sangre hasta que haya desaparecido el efecto de los medicamentos administrados.  No conduzca hasta que el mdico lo autorice.  Esta informacin no tiene Theme park manager el consejo del mdico. Asegrese de hacerle al mdico cualquier pregunta que tenga. Document Released: 12/23/2010 Document Revised: 03/13/2016 Document Reviewed: 10/24/2015 Elsevier Interactive Patient Education  2017 Elsevier Inc.  Aortic Valve Replacement, Care After Refer to this sheet in the next few weeks. These instructions provide you with information about caring for yourself after your procedure. Your health care provider may also give you more specific instructions. Your treatment has been planned according to current medical practices, but problems sometimes occur. Call your health care provider if you have any problems or questions after your procedure. What can I expect after the procedure? After the procedure, it is common to have:  Pain around your incision area.  A small amount of blood or clear fluid coming from your incision.  Follow these instructions at home: Eating and drinking   Follow instructions from your health care provider about eating or drinking restrictions. ? Limit alcohol intake to no more than 1 drink per day for nonpregnant women and 2 drinks per day for men. One drink equals 12 oz of beer, 5 oz of wine, or 1 oz of hard liquor. ? Limit how much caffeine you drink. Caffeine can affect your heart's rate and rhythm.  Drink enough fluid to keep your urine clear or pale yellow.  Eat a heart-healthy diet. This should include plenty of fresh fruits and vegetables. If you eat meat, it should be lean cuts. Avoid foods that are: ? High in salt, saturated fat, or sugar. ? Canned or highly processed. ? Fried. Activity  Return to your normal activities as told by your health care provider. Ask your health care provider what activities are safe for you.  Exercise regularly  once you have recovered, as told by your health care provider.  Avoid sitting for more than 2 hours at a time without moving. Get up and move around at least once every 1-2 hours. This helps to prevent blood clots in the legs.  Do not lift anything that is heavier than 10 lb (4.5 kg) until your health care provider approves.  Avoid pushing or pulling things with your arms until your health care provider approves. This includes pulling on handrails to help you climb stairs. Incision care   Follow instructions from your health care provider about how to take care of your incision. Make sure you: ? Wash your hands with soap and water before you change your bandage (dressing). If soap and water are not available, use hand sanitizer. ? Change your dressing as told by your health care provider. ? Leave stitches (sutures), skin glue, or adhesive strips in place. These skin closures may need to stay in place for 2 weeks or longer. If adhesive strip edges start to loosen and curl up, you may trim the loose edges. Do not remove adhesive strips completely unless your health care provider tells you to do that.  Check your incision area every day for signs of infection. Check for: ? More redness, swelling, or pain. ? More fluid or blood. ? Warmth. ? Pus or a bad smell. Medicines  Take over-the-counter and prescription medicines only as told by your health care provider.  If you were prescribed an antibiotic medicine, take it as told by your health care provider. Do not stop taking the  antibiotic even if you start to feel better. Travel  Avoid airplane travel for as long as told by your health care provider.  When you travel, bring a list of your medicines and a record of your medical history with you. Carry your medicines with you. Driving  Ask your health care provider when it is safe for you to drive. Do not drive until your health care provider approves.  Do not drive or operate heavy  machinery while taking prescription pain medicine. Lifestyle   Do not use any tobacco products, such as cigarettes, chewing tobacco, or e-cigarettes. If you need help quitting, ask your health care provider.  Resume sexual activity as told by your health care provider. Do not use medicines for erectile dysfunction unless your health care provider approves, if this applies.  Work with your health care provider to keep your blood pressure and cholesterol under control, and to manage any other heart conditions that you have.  Maintain a healthy weight. General instructions  Do not take baths, swim, or use a hot tub until your health care provider approves.  Do not strain to have a bowel movement.  Avoid crossing your legs while sitting down.  Check your temperature every day for a fever. A fever may be a sign of infection.  If you are a woman and you plan to become pregnant, talk with your health care provider before you become pregnant.  Wear compression stockings if your health care provider instructs you to do this. These stockings help to prevent blood clots and reduce swelling in your legs.  Tell all health care providers who care for you that you have an artificial (prosthetic) aortic valve. If you have or have had heart disease or endocarditis, tell all health care providers about these conditions as well.  Keep all follow-up visits as told by your health care provider. This is important. Contact a health care provider if:  You develop a skin rash.  You experience sudden, unexplained changes in your weight.  You have more redness, swelling, or pain around your incision.  You have more fluid or blood coming from your incision.  Your incision feels warm to the touch.  You have pus or a bad smell coming from your incision.  You have a fever. Get help right away if:  You develop chest pain that is different from the pain coming from your incision.  You develop shortness  of breath or difficulty breathing.  You start to feel light-headed. These symptoms may represent a serious problem that is an emergency. Do not wait to see if the symptoms will go away. Get medical help right away. Call your local emergency services (911 in the [place] Do not drive yourself to the hospital. This information is not intended to replace advice given to you by your health care provider. Make sure you discuss any questions you have with your health care provider. Document Released: 06/08/2005 Document Revised: 04/27/2016 Document Reviewed: 10/24/2015 Elsevier Interactive Patient Education  2017 [employer]

## 2018-03-20 ENCOUNTER — Telehealth (HOSPITAL_COMMUNITY): Payer: Self-pay

## 2018-03-20 MED FILL — METOPROLOL TARTRATE 25 MG T: 25 | 30 days supply | Qty: 60 | Fill #0 | Status: TO

## 2018-03-20 MED FILL — DOXYCYCLINE HYCLATE 100 MG: 100 | 33 days supply | Qty: 66 | Fill #0

## 2018-03-20 MED FILL — oxyCODONE HCL 5 MG TABS: 5 | 7 days supply | Qty: 30 | Fill #0

## 2018-03-20 NOTE — Telephone Encounter (Signed)
Referral received. Attempted to call patient through [employer] [facility] in regards to Cardiac Rehab Maintenance Program as patient does not have insurance - lm on vm

## 2018-03-21 ENCOUNTER — Encounter: Payer: Self-pay | Admitting: Infectious Disease

## 2018-03-21 ENCOUNTER — Other Ambulatory Visit: Payer: Self-pay | Admitting: Pharmacist

## 2018-03-21 ENCOUNTER — Encounter (HOSPITAL_COMMUNITY): Payer: Self-pay | Admitting: Thoracic Surgery (Cardiothoracic Vascular Surgery)

## 2018-03-26 ENCOUNTER — Encounter (HOSPITAL_COMMUNITY): Payer: Self-pay | Admitting: Thoracic Surgery (Cardiothoracic Vascular Surgery)

## 2018-03-27 ENCOUNTER — Encounter: Payer: Self-pay | Admitting: Infectious Disease

## 2018-03-29 ENCOUNTER — Other Ambulatory Visit: Payer: Self-pay | Admitting: Pharmacist

## 2018-04-04 ENCOUNTER — Other Ambulatory Visit: Payer: Self-pay | Admitting: Pharmacist

## 2018-04-05 ENCOUNTER — Telehealth (HOSPITAL_COMMUNITY): Payer: Self-pay

## 2018-04-05 NOTE — Telephone Encounter (Signed)
Called patient through [employer] [facility] and patient stated he does not have insurance and cannot afford the Cardiac Rehab Maintenance program. Closed referral.

## 2018-04-08 ENCOUNTER — Ambulatory Visit: Payer: Self-pay | Admitting: Physician Assistant

## 2018-04-09 LAB — FUNGUS CULTURE WITH STAIN

## 2018-04-09 LAB — FUNGAL ORGANISM REFLEX

## 2018-04-09 LAB — FUNGUS CULTURE RESULT

## 2018-04-10 ENCOUNTER — Encounter: Payer: Self-pay | Admitting: Physician Assistant

## 2018-04-12 ENCOUNTER — Other Ambulatory Visit: Payer: Self-pay | Admitting: Pharmacist

## 2018-04-12 NOTE — Progress Notes (Signed)
[facility] lab monitoring 

## 2018-04-15 ENCOUNTER — Other Ambulatory Visit: Payer: Self-pay | Admitting: Thoracic Surgery (Cardiothoracic Vascular Surgery)

## 2018-04-15 DIAGNOSIS — Z952 Presence of prosthetic heart valve: Secondary | ICD-10-CM

## 2018-04-16 ENCOUNTER — Ambulatory Visit: Payer: Self-pay | Admitting: Thoracic Surgery (Cardiothoracic Vascular Surgery)

## 2018-04-17 ENCOUNTER — Inpatient Hospital Stay: Payer: Self-pay | Admitting: Infectious Disease

## 2018-04-17 ENCOUNTER — Encounter: Payer: Self-pay | Admitting: Physician Assistant

## 2018-04-18 ENCOUNTER — Ambulatory Visit (INDEPENDENT_AMBULATORY_CARE_PROVIDER_SITE_OTHER): Payer: Self-pay | Admitting: Nurse Practitioner

## 2018-04-19 ENCOUNTER — Other Ambulatory Visit: Payer: Self-pay | Admitting: Pharmacist

## 2018-04-19 NOTE — Progress Notes (Signed)
[facility] pharmacy lab review  

## 2018-04-23 ENCOUNTER — Ambulatory Visit (INDEPENDENT_AMBULATORY_CARE_PROVIDER_SITE_OTHER): Payer: Self-pay | Admitting: Nurse Practitioner

## 2018-04-23 ENCOUNTER — Encounter (INDEPENDENT_AMBULATORY_CARE_PROVIDER_SITE_OTHER): Payer: Self-pay | Admitting: Nurse Practitioner

## 2018-04-23 VITALS — BP 132/83 | HR 77 | Temp 98.2°F | Ht 60.0 in | Wt 163.4 lb

## 2018-04-23 DIAGNOSIS — Z952 Presence of prosthetic heart valve: Secondary | ICD-10-CM

## 2018-04-23 DIAGNOSIS — M545 Low back pain, unspecified: Secondary | ICD-10-CM

## 2018-04-23 LAB — ACID FAST CULTURE WITH REFLEXED SENSITIVITIES (MYCOBACTERIA)

## 2018-04-23 LAB — ACID FAST CULTURE WITH REFLEXED SENSITIVITIES: ACID FAST CULTURE - AFSCU3: NEGATIVE

## 2018-04-23 MED ORDER — CYCLOBENZAPRINE HCL 5 MG PO TABS: 5.0000 mg | ORAL_TABLET | Freq: Three times a day (TID) | ORAL | 1 refills | Status: DC | PRN

## 2018-04-23 NOTE — Progress Notes (Signed)
Assessment & Plan:  John Byrd was seen today for hospitalization follow-up.  Diagnoses and all orders for this visit:  S/P AVR Follow-up with cardiothoracic surgery on 04-30-2018 as instructed.   Acute right-sided low back pain without sciatica -     cyclobenzaprine (FLEXERIL) 5 MG tablet; Take 1 tablet (5 mg total) by mouth 3 (three) times daily as needed for muscle spasms.   Patient has been counseled on age-appropriate routine health concerns for screening and prevention. These are reviewed and up-to-date. Referrals have been placed accordingly. Immunizations are up-to-date or declined.    Subjective:   Chief Complaint  Patient presents with  . Hospitalization Follow-up   HPI John Byrd 57 y.o. male presents to office today to establish care as a new patient. He is currently uninsured and has been given information regarding applying for the In house financial assistance program. VRI was used to communicate directly with patient for the entire encounter including providing detailed patient instructions.   AVR He was admitted to the hospital on 03-09-2018 with 3 week progression of worsening shortness of breath. He required emergent intubation and was noted for new onset CHF as well as vegetation of the aortic valve.  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 He was hospitalized for 10 days and received physical therapy post op. He was discharged in stable condition on metoprolol  BID and IV vanc which has been completed at this time. Today he does endorse mild throat irritation (could likely be related to his intubation. Will continue to monitor) and right sided low back pain. I will prescribe a muscle relaxant for his back pain. He endorses the onset of the pain has been since discharge. He denies any chest pain, shortness of breath, GU symptoms or Nausea/vomiting. I have instructed him that he should not be  participating in any work that requires lifting, pulling or strenuous activity.    Review of Systems  Constitutional: Negative for fever, malaise/fatigue and weight loss.  HENT: Positive for sore throat. Negative for nosebleeds.   Eyes: Negative.  Negative for blurred vision, double vision and photophobia.  Respiratory: Negative.  Negative for cough and shortness of breath.   Cardiovascular: Negative.  Negative for chest pain, palpitations and leg swelling.  Gastrointestinal: Negative.  Negative for heartburn, nausea and vomiting.  Musculoskeletal: Positive for back pain and myalgias.  Neurological: Negative.  Negative for dizziness, focal weakness, seizures and headaches.  Psychiatric/Behavioral: Negative.  Negative for suicidal ideas.    Past Medical History:  Diagnosis Date  . Back pain   . CHF (congestive heart failure) (HCC)   . Frequent headaches   . Muscle pain     Past Surgical History:  Procedure Laterality Date  . AORTIC VALVE REPLACEMENT N/A 03/11/2018   Procedure: AORTIC VALVE REPLACEMENT (AVR) USING 21 MM MAGNA EASE PERICARDIAL [name], MODEL [id number], SERIAL # [id number];  Surgeon: Loreli Slot, MD;  Location: [facility] OR;  Service: Open Heart Surgery;  Laterality: N/A;  . No prior surgery    . VIDEO BRONCHOSCOPY N/A 03/11/2018   Procedure: VIDEO BRONCHOSCOPY;  Surgeon: Loreli Slot, MD;  Location[facility] OR;  Service: Open Heart Surgery;  Laterality: N/A;    No family history on file.  Social History Reviewed with no changes to be made today.   Outpatient Medications Prior to Visit  Medication Sig Dispense Refill  . aspirin EC 325 MG EC tablet Take 1 tablet (325 mg total) by mouth daily.    Marland Kitchen  metoprolol tartrate (LOPRESSOR) 25 MG tablet Take 1 tablet (25 mg total) by mouth 2 (two) times daily. 60 tablet 1  . famotidine (PEPCID) 20 MG tablet Take 1 tablet (20 mg total) by mouth 2 (two) times daily. (Patient not taking: Reported on 03/09/2018) 30 tablet  0   No facility-administered medications prior to visit.     No Known Allergies     Objective:    BP 132/83 (BP Location: Left Arm, Patient Position: Sitting, Cuff Size: Normal)   Pulse 77   Temp 98.2 F (36.8 C) (Oral)   Ht 5' (1.524 m)   Wt 163 lb 6.4 oz (74.1 kg)   SpO2 97%   BMI 31.91 kg/m  Wt Readings from Last 3 Encounters:  04/23/18 163 lb 6.4 oz (74.1 kg)  03/19/18 153 lb 11.2 oz (69.7 kg)    Physical Exam  Constitutional: He is oriented to person, place, and time. He appears well-developed and well-nourished. He is cooperative.  HENT:  Head: Normocephalic and atraumatic.  Mouth/Throat: Uvula is midline and mucous membranes are normal. No oral lesions. Posterior oropharyngeal edema present. No oropharyngeal exudate or posterior oropharyngeal erythema.  Eyes: EOM are normal.  Neck: Normal range of motion. No thyromegaly present.  Cardiovascular: Normal rate and regular rhythm. Exam reveals no gallop and no friction rub.  Murmur heard. Pulmonary/Chest: Effort normal and breath sounds normal. No tachypnea. No respiratory distress. He has no decreased breath sounds. He has no wheezes. He has no rhonchi. He has no rales. He exhibits no tenderness.  Abdominal: Soft. Bowel sounds are normal.  Musculoskeletal: Normal range of motion. He exhibits no edema.       Lumbar back: He exhibits tenderness and pain. He exhibits no bony tenderness, no edema and no deformity.       Back:  Neurological: He is alert and oriented to person, place, and time. Coordination normal.  Skin: Skin is warm and dry.  Psychiatric: He has a normal mood and affect. His behavior is normal. Judgment and thought content normal.  Nursing note and vitals reviewed.     Patient has been counseled extensively about nutrition and exercise as well as the importance of adherence with medications and regular follow-up. The patient was given clear instructions to go to ER or return to medical center if symptoms  don't improve, worsen or new problems develop. The patient verbalized understanding.   Follow-up: Return in about 1 month (around 05/21/2018) for Follow up for Muscle Pain in back.   Claiborne Rigg, FNP-BC [facility] [place], [place] [phone]   04/23/2018, 8:07 PM

## 2018-04-23 NOTE — Patient Instructions (Addendum)
[facility] for Financial Assistance Address: [address] [place], [place] [address]  Open [phone] and Closes 5:30PM  Phone: [phone]

## 2018-04-24 ENCOUNTER — Other Ambulatory Visit: Payer: Self-pay | Admitting: Thoracic Surgery (Cardiothoracic Vascular Surgery)

## 2018-04-24 DIAGNOSIS — Z952 Presence of prosthetic heart valve: Secondary | ICD-10-CM

## 2018-04-26 ENCOUNTER — Ambulatory Visit: Payer: Self-pay | Attending: Physician Assistant

## 2018-04-30 ENCOUNTER — Other Ambulatory Visit: Payer: Self-pay

## 2018-04-30 ENCOUNTER — Encounter: Payer: Self-pay | Admitting: Thoracic Surgery (Cardiothoracic Vascular Surgery)

## 2018-04-30 ENCOUNTER — Ambulatory Visit (INDEPENDENT_AMBULATORY_CARE_PROVIDER_SITE_OTHER): Payer: Self-pay | Admitting: Thoracic Surgery (Cardiothoracic Vascular Surgery)

## 2018-04-30 VITALS — BP 125/84 | HR 78 | Resp 16 | Ht 60.0 in | Wt 166.2 lb

## 2018-04-30 DIAGNOSIS — Z952 Presence of prosthetic heart valve: Secondary | ICD-10-CM

## 2018-04-30 DIAGNOSIS — I358 Other nonrheumatic aortic valve disorders: Secondary | ICD-10-CM

## 2018-04-30 NOTE — Progress Notes (Signed)
[address]       [place] [address]             [phone]     HPI: John Byrd returns for scheduled follow-up visit.  He is accompanied by professional interpreter.  John Byrd is a 57 year old gentleman with a history of hypertension who was admitted in early April with shortness of breath and leg swelling.  He was found to have acute diastolic heart failure and developed acute respiratory failure.  He was diagnosed with aortic valve endocarditis.  He underwent initial medical stabilization followed by aortic valve replacement on 03/11/2018.  He required additional diuresis before he can be weaned from the ventilator but then progressed well prior to discharge in the hospital.  His cultures were negative, but RNA testing was positive for Streptococcus mitis.  He completed a 6-week course of antibiotics.  He feels well.  He does not have any chest pain or shortness of breath.  He denies swelling in his legs.  He does have some muscle soreness in his chest area.  He is not taking anything for that.  His exercise tolerance is good.  He thinks he might be ready to go back to work in a couple of weeks.  Past Medical History:  Diagnosis Date  . 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   Status post aortic valve replacement 03/11/2018  . Back pain   . CHF (congestive heart failure) (HCC)   . Frequent headaches   . Heart murmur   . Muscle pain       Current Outpatient Medications  Medication Sig Dispense Refill  . aspirin EC 325 MG EC tablet Take 1 tablet (325 mg total) by mouth daily.    . metoprolol tartrate (LOPRESSOR) 25 MG tablet Take 1 tablet (25 mg total) by mouth 2 (two) times daily. 60 tablet 1   No current facility-administered medications for this visit.     Physical Exam BP 125/84 (BP Location: Right Arm, Patient Position: Sitting, Cuff Size: Normal)   Pulse 78   Resp 16   Ht 5' (1.524 m)   Wt 166 lb 3.2 oz (75.4 kg)   SpO2 98% Comment: ON RA   BMI 32.92 kg/m  57 year old male in no acute distress Alert and oriented x3 with no focal deficits Sternal incision clean dry and intact, sternum stable Cardiac regular rate and rhythm with a 2/6 systolic murmur Lungs clear with equal breath sounds bilaterally No peripheral edema   Impression: John Byrd is a 57 year old Hispanic male who underwent aortic valve replacement for aortic valve endocarditis complicated by acute diastolic congestive heart failure and acute respiratory failure.  He is now about 7 weeks out from surgery and is doing very well.  There are no restrictions on his activities at this time. His exercise tolerance is good.  I recommended that he start to increase his walking.  He thinks he will be able to return to work in a couple of weeks.  We set a tentative date for June 17.  He is completed his antibiotics for endocarditis.  I stressed the importance of vigilance regarding infections and early treatment with antibiotics in the setting of an artificial heart valve.  He also should take antibiotics prior to any dental work.  Plan: [facility]  I will be happy to see him back anytime if I can be of any further assistance with his care  Melrose Nakayama, MD [facility] [phone]

## 2018-05-09 MED FILL — ?METOPROLOL TARTRATE 25 MG: 25 | 30 days supply | Qty: 60 | Fill #0

## 2018-05-24 ENCOUNTER — Ambulatory Visit (INDEPENDENT_AMBULATORY_CARE_PROVIDER_SITE_OTHER): Payer: Self-pay | Admitting: Physician Assistant

## 2018-06-03 ENCOUNTER — Telehealth: Payer: Self-pay | Admitting: *Deleted

## 2018-06-03 NOTE — Telephone Encounter (Signed)
Called and spoke with patient to schedule post S/P AVR visit with Dr. Jens Somrenshaw or APP per Dr. Dorris FetchHendrickson.  Patient states he is working and will call back to schedule.

## 2018-09-02 ENCOUNTER — Encounter: Payer: Self-pay | Admitting: Physician Assistant

## 2018-09-02 ENCOUNTER — Ambulatory Visit (INDEPENDENT_AMBULATORY_CARE_PROVIDER_SITE_OTHER): Payer: Self-pay | Admitting: Physician Assistant

## 2018-09-02 VITALS — BP 140/88 | HR 76 | Ht 60.0 in | Wt 171.2 lb

## 2018-09-02 DIAGNOSIS — Z952 Presence of prosthetic heart valve: Secondary | ICD-10-CM

## 2018-09-02 DIAGNOSIS — I10 Essential (primary) hypertension: Secondary | ICD-10-CM

## 2018-09-02 DIAGNOSIS — R9431 Abnormal electrocardiogram [ECG] [EKG]: Secondary | ICD-10-CM

## 2018-09-02 MED ORDER — AMOXICILLIN 500 MG PO CAPS: ORAL_CAPSULE | ORAL | 3 refills | Status: DC

## 2018-09-02 MED ORDER — ASPIRIN EC 81 MG PO TBEC: 81.0000 mg | DELAYED_RELEASE_TABLET | Freq: Every day | ORAL | 3 refills | Status: DC

## 2018-09-02 MED ORDER — CARVEDILOL 6.25 MG PO TABS: 6.2500 mg | ORAL_TABLET | Freq: Two times a day (BID) | ORAL | 3 refills | Status: DC

## 2018-09-02 MED FILL — ?CARVEDILOL 6.25 MG TABLET: 6.25 | 30 days supply | Qty: 60 | Fill #0

## 2018-09-02 MED FILL — AMOXICILLIN 500 MG CAPSULE: 500 | 1 days supply | Qty: 4 | Fill #0

## 2018-09-02 NOTE — Addendum Note (Signed)
Addended by: Daleen Bo I on: 09/02/2018 10:59 AM   Modules accepted: Orders

## 2018-09-02 NOTE — Patient Instructions (Addendum)
Medication Instructions:  Your physician has recommended you make the following change in your medication:   1. DECREASE: Aspirin to 81 mg once a day  2. STOP: metoprolol  3. START: carvedilol 6.25 mg tablet: Take 1 tablet by mouth twice a day  4. A prescription was sent in for amoxicillin for you to take prior to any dental procedures   Labwork: None ordered  Testing/Procedures: None ordered  Follow-Up: Your physician recommends that you establish with [facility]. [phone]   Your physician wants you to follow-up in: 6 months with Dr. Jens Som. You will receive a reminder letter in the mail two months in advance. If you don't receive a letter, please call our office to schedule the follow-up appointment.    Any Other Special Instructions Will Be Listed Below (If Applicable).  Endocarditis (Endocarditis)  La endocarditis es una infeccin en la capa interna del corazn (endocardio) o en las vlvulas cardacas. Puede producir vegetaciones en el corazn o en las vlvulas cardacas. Estas vegetaciones pueden destruir tejido cardaco y, con el tiempo, provocar insuficiencia cardaca. Tambin pueden causar ictus si se desprenden y forman un cogulo sanguneo en el cerebro. CAUSAS  La endocarditis es provocada por grmenes que normalmente viven en el organismo. Los grmenes que suelen causar endocarditis son bacterias, aunque los hongos tambin pueden provocarla. FACTORES DE RIESGO Entre los factores de riesgo se incluyen los siguientes:  Tener un defecto cardaco.  Tener vlvulas cardacas artificiales (prtesis).  Tener una vlvula cardaca anormal o daada.  Tener antecedentes de endocarditis.  Leanor Rubenstein recibido un trasplante cardaco. Marnee Spring Los signos y sntomas pueden comenzar de forma repentina o pueden comenzar lentamente y Theme park manager de forma gradual. Algunos sntomas son los siguientes:  Teacher, English as a foreign language.  Escalofros.  Sudoracin  nocturna.  Dolores musculares.  Fatiga.  Debilidad.  Falta de aire. Algunos signos son los siguientes:  Sonido anormal en el corazn (soplo).  Sangrado retinal.  Sangrado debajo de las uas de los dedos de las manos o de los pies.  Manchas rojas indoloras en las palmas de las manos.  Bultos dolorosos en la punta de los dedos de las manos o de los pies.  Hinchazn de los tobillos o los pies. DIAGNSTICO  Para realizar un diagnstico, el mdico puede hacer lo siguiente:  Medical sales representative examen fsico. Durante el examen, escuchar su corazn para determinar si hay un soplo. Tambin puede utilizar un endoscopio para comprobar si hay sangrado en las retinas.  Indicar exmenes. Estos pueden incluir los siguientes: ? Anlisis de sangre para buscar grmenes que causan endocarditis. ? Un ecocardiograma para obtener una imagen del corazn. TRATAMIENTO El tratamiento temprano ofrece las mejores probabilidades de curar la endocarditis y prevenir complicaciones. El tratamiento depender de la causa de la endocarditis. El tratamiento puede incluir lo siguiente:  Antibiticos. Estos pueden administrarse a travs de una va intravenosa (IV) o por va oral.  Ciruga para reemplazar la vlvula cardaca. Tal vez necesite ciruga si: ? La endocarditis no responde al tratamiento. ? Presenta complicaciones. ? La vlvula cardaca est gravemente daada. INSTRUCCIONES PARA EL CUIDADO EN EL HOGAR  Tome los antibiticos como se lo haya indicado el mdico. Termine los antibiticos aunque comience a sentirse mejor.  Retome sus actividades habituales gradualmente.  Informe al mdico antes de someterse a cualquier procedimiento dental o quirrgico. Tal vez tenga que tomar antibiticos antes del procedimiento.  Informe a todos los mdicos y al dentista que ha tenido endocarditis.  No se haga tatuajes ni perforaciones (prsines)  en el cuerpo.  No use drogas por va intravenosa salvo que sean parte de  su tratamiento mdico.  Mantenga una buena higiene bucal. Esto incluye lo siguiente: ? Cepillarse los dientes y Chemical engineer hilo Optician, dispensing. ? Asista a consultas dentales de rutina. SOLICITE ATENCIN MDICA SI:  Lance Muss.  Los sntomas no mejoran.  Los sntomas empeoran.  Los sntomas vuelven a Research officer, trade union. SOLICITE ATENCIN MDICA DE INMEDIATO SI:  Tiene dificultad para respirar.  Siente dolor en el pecho.  Tiene sntomas de ictus. Estos incluyen los siguientes: ? Debilidad repentina. ? Entumecimiento. ? Confusin. ? Dificultad para hablar. ? Dolor de cabeza intenso. Esta informacin no tiene Theme park manager el consejo del mdico. Asegrese de hacerle al mdico cualquier pregunta que tenga.   Carvedilol tablets Qu es este medicamento? El CARVEDILOL es un beta-bloqueante. Los beta-bloqueantes reducen la carga de trabajo del corazn y lo ayudan a latir ms regularmente. Este medicamento se [place] para tratar la alta presin sangunea y la insuficiencia cardiaca. Este medicamento puede ser utilizado para otros usos; si tiene alguna pregunta consulte con su proveedor de atencin mdica o con su farmacutico. MARCAS COMUNES: [place]g Qu le debo informar a mi profesional de la salud antes de tomar este medicamento? Necesita saber si usted presenta alguno de los siguientes problemas o situaciones: -problemas de circulacin -diabetes -antecedentes de ataques o enfermedades cardacas -enfermedad heptica -enfermedad pulmonar o respiratoria, tales como enfisema o asma -feocromocitoma -pulso cardiaco lento o irregular -enfermedad tiroidea -una reaccin alrgica o inusual al carvedilol, otros beta-bloqueantes, medicamentos, alimentos, colorantes o conservantes -si est embarazada o buscando quedar embarazada -si est amamantando a un beb Cmo debo utilizar este medicamento? Tome este medicamento por va oral con un vaso de agua. Siga las instrucciones de la etiqueta  del [place]. Es preferible tomar las tabletas con alimentos. Tome sus dosis a intervalos regulares. No tome su medicamento con una frecuencia mayor que la indicada. No deje de tomarlo excepto si as lo indica su mdico o su profesional de [employer]. Hable con su pediatra para informarse acerca del uso de este medicamento en nios. Puede requerir atencin especial. Sobredosis: Pngase en contacto inmediatamente con un centro toxicolgico o una sala de urgencia si usted cree que haya tomado demasiado medicamento. ATENCIN: [employer] es solo para usted. No comparta este medicamento con nadie. Qu sucede si me olvido de una dosis? Si olvida una dosis, tmela lo antes posible. Si es casi la hora de la prxima dosis, tome slo esa dosis. No tome dosis adicionales o dobles. Qu puede interactuar con este medicamento? Esta medicina puede interactuar con los siguientes medicamentos: -ciertos medicamentos para la presin sangunea, enfermedad cardiaca, pulso cardiaco irregular -ciertos medicamentos para la depresin, tales como fluoxetina o paroxetina -ciertos medicamentos para la diabetes, tales como glipizida o gliburida -cimetidina -clonidina -ciclosporina -digoxina -IMAOs, tales como Carbex, Eldepryl, Marplan, Nardil y Parnate -reserpina -rifampicina Puede ser que esta lista no menciona todas las posibles interacciones. Informe a su profesional de [employer] de [employer] productos a base de hierbas, medicamentos de [place] o suplementos nutritivos que est tomando. Si usted fuma, consume bebidas alcohlicas o si utiliza drogas ilegales, indqueselo tambin a su profesional de [employer]. Algunas sustancias pueden interactuar con su medicamento. A qu debo estar atento al usar [employer]? Controle su presin sangunea y su frecuencia cardiaca regularmente mientras est tomando este medicamento. Pregunte a su mdico o a su profesional de la salud cules deben ser su frecuencia cardiaca  y su  presin sangunea y cundo deber comunicarse con l/ella. No suspenda este medicamento de [employer]. Puede causarle serios problemas cardiacos. Comunquese con su mdico o con su profesional de la salud si tiene dificultad para Management consultant est tomando [employer]. Controle su peso todos [place]. Consulte con su mdico o con su profesional de la salud acerca de cundo debe informarle sobre los aumentos de [place]. Puede experimentar somnolencia o mareos. No conduzca ni utilice maquinaria ni haga nada que Scientist, research (life sciences) en estado de alerta hasta que sepa cmo le afecta este medicamento. Para reducir el riesgo de mareos o [place], no se siente ni se ponga de pie con rapidez. El alcohol puede provocar somnolencia e incrementar el enrojecimiento y el pulso cardiaco. Evite consumir bebidas alcohlicas. Si va a someterse a una operacin, informe a su mdico o a su profesional de la salud que est tomando [employer]. Qu efectos secundarios puedo tener al [employer] este medicamento? Efectos secundarios que debe informar a su mdico o a Producer, television/film/video de la salud tan pronto como sea posible: -Therapist, art como erupcin cutnea, picazn o urticarias, hinchazn de la cara, labios o lengua -problemas respiratorios -orina oscura -latidos cardiacos irregulares -piernas o tobillos hinchados -vmito -color amarillento de los ojos o la piel Efectos secundarios que, por lo general, no requieren atencin mdica (debe informarlos a su mdico o a su profesional de la salud si persisten o si son molestos): -cambios del deseo sexual o funcin sexual -diarrea -ojos secos (especialmente si [place] lentes de Pharmacologist) -piel seca con picazn -dolor de cabeza -nuseas -cansancio inusual Puede ser que esta lista no menciona todos los posibles efectos secundarios. Comunquese a su mdico por asesoramiento mdico [employer]. Usted puede informar los efectos secundarios  a la FDA por telfono al 1-800-FDA-1088. Dnde debo guardar mi medicina? Mantngala fuera del alcance de los nios. Gurdela a temperatura ambiente inferior a 30 grados C (86 grados F). Protjala de la humedad. Mantenga el envase bien cerrado. Deseche todo medicamento que no haya utilizado, despus de su fecha de vencimiento. ATENCIN: Este folleto es un resumen. Puede ser que no cubra toda la posible informacin. Si usted tiene preguntas acerca de esta medicina, consulte con su mdico, su farmacutico o su profesional de Radiographer, therapeutic.  If you need a refill on your cardiac medications before your next appointment, please call your pharmacy.

## 2018-09-02 NOTE — Progress Notes (Signed)
Cardiology Office Note    Date:  09/02/2018   ID:  John Byrd, John Byrd 10/16/1961, MRN [id number]  PCP:  John Specter, PA-C  Cardiologist: John Byrd   Chief Complaint: hospital follow up from AVR  History of Present Illness:   John Byrd is a 56 y.o. male with hx of HTN, bacterial endocarditis/aortic valve vegetation and severe AI s/p  AVR presents for follow up.   Admitted 03/2018 for dyspnea requiring emergent Intubation for hypoxic respiratory failure. A transthoracic echocardiogram was obtained which revealed a significant vegetation on the aortic valve. Subsequently underwent initial medical stabilization followed by aortic valve replacement on 03/11/2018. He completed a 6-week course of antibiotics. He was doing well when last seen by Dr. Dorris Byrd 04/30/18.  Here today for follow up. Speaks little english. In-house interpreter used. The patient denies nausea, vomiting, fever, chest pain, palpitations, shortness of breath, orthopnea, PND, dizziness, syncope, cough, congestion, abdominal pain, hematochezia, melena, lower extremity edema. His only complain is bilateral knee pain. Not taking metoprolol for past few months. Takes ASA 324mg  qd. No bleeding issue. Works at [employer] person.   Past Medical History:  Diagnosis Date  . 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 Back pain   . CHF (congestive heart failure) (HCC)   . Frequent headaches   . Heart murmur   . Hypertension   . Muscle pain     Past Surgical History:  Procedure Laterality Date  . AORTIC VALVE REPLACEMENT N/A 03/11/2018   Procedure: AORTIC VALVE REPLACEMENT (AVR) USING 21 MM MAGNA EASE PERICARDIAL [name], MODEL 3300TFX, SERIAL # [id number];  Surgeon: Loreli Slot, MD;  Location: [facility] OR;  Service: Open Heart Surgery;   Laterality: N/A;  . No prior surgery    . VIDEO BRONCHOSCOPY N/A 03/11/2018   Procedure: VIDEO BRONCHOSCOPY;  Surgeon: Loreli Slot, MD;  Location: [facility] OR;  Service: Open Heart Surgery;  Laterality: N/A;    Current Medications: Prior to Admission medications   Medication Sig Start Date End Date Taking? Authorizing Provider  aspirin EC 325 MG EC tablet Take 1 tablet (325 mg total) by mouth daily. 03/19/18   Gold, [name], PA-C  metoprolol tartrate (LOPRESSOR) 25 MG tablet Take 1 tablet (25 mg total) by mouth 2 (two) times daily. 03/19/18   Rowe Clack, PA-C    Allergies:   Patient has no known allergies.   Social History   Socioeconomic History  . Marital status: Significant Other    Spouse name: Not on file  . Number of children: Not on file  . Years of education: Not on file  . Highest education level: Not on file  Occupational History  . Not on file  Social Needs  . Financial resource strain: Not on file  . Food insecurity:    Worry: Not on file    Inability: Not on file  . Transportation needs:    Medical: Not on file    Non-medical: Not on file  Tobacco Use  . Smoking status: Never Smoker  . Smokeless tobacco: Never Used  Substance and Sexual Activity  . Alcohol use: Yes  . Drug use: Not Currently  . Sexual activity: Not on file  Lifestyle  . Physical activity:    Days per week: Not on file    Minutes per session: Not on file  . Stress: Not on file  Relationships  . Social connections:    Talks on phone: Not on file    Gets together: Not on file    Attends religious service: Not on file    Active member of club or organization: Not on file    Attends meetings of clubs or organizations: Not on file    Relationship status: Not on file  Other Topics Concern  . Not on file  Social History Narrative   Lives with girlfriend John Byrd and her son John Byrd in [place], [place]. Works as a Optometrist. Spanish-speaking but knows some basic  [place]. Has three adult daughters who live in [place].      Family History:  The patient's family history is not on file.   ROS:   Please see the history of present illness.    ROS All other systems reviewed and are negative.   PHYSICAL EXAM:   VS:  BP 140/88   Pulse 76   Ht 5' (1.524 m)   Wt 171 lb 3.2 oz (77.7 kg)   SpO2 94%   BMI 33.44 kg/m    GEN: Well nourished, well developed, in no acute distress  HEENT: normal  Neck: no JVD, carotid bruits, or masses Cardiac: RRR; 2/6 systolic murmurs, rubs, or gallops,no edema  Respiratory:  clear to auscultation bilaterally, normal work of breathing GI: soft, nontender, nondistended, + BS MS: no deformity or atrophy  Skin: warm and dry, no rash Neuro:  Alert and Oriented x 3, Strength and sensation are intact Psych: euthymic mood, full affect  Wt Readings from Last 3 Encounters:  09/02/18 171 lb 3.2 oz (77.7 kg)  04/30/18 166 lb 3.2 oz (75.4 kg)  04/23/18 163 lb 6.4 oz (74.1 kg)      Studies/Labs Reviewed:   EKG:  EKG is ordered today.  The ekg ordered today demonstrates NSR  Recent Labs: 03/09/2018: B Natriuretic Peptide 721.6 03/16/2018: ALT 44; Magnesium 1.9 03/19/2018: BUN 17; Creatinine, Ser 0.70; Hemoglobin 9.4; Platelets 340; Potassium 3.9; Sodium 135   Lipid Panel    Component Value Date/Time   TRIG 74 03/10/2018 0059    Additional studies/ records that were reviewed today include:   Echocardiogram: 03/2018 Study Conclusions  - Left ventricle: The cavity size was mildly dilated. Wall   thickness was normal. Systolic function was normal. The estimated   ejection fraction was in the range of 50% to 55%. Wall motion was   normal; there were no regional wall motion abnormalities. - Aortic valve: There was an apparent, large vegetation. There was   severe regurgitation. - Mitral valve: Calcified annulus. There was mild regurgitation.  Impressions:  - Normal LV systolic function; mild LVE; large vegetation  on aortic   valve with severe AI (pressure half time 135; flow reversal in   descending aorta); mild MR and TR.   ASSESSMENT & PLAN:    1. S/p AVR (Pericardiac valve) Reviewed with Dr. Okey Dupre change ASA to 81mg  qd. Denies illicit drug use. No dyspnea. SBE prophylaxis reviewed.   2. HTN - Not taking metoprolol. LVH on EKG. BP of 140/88. Start Coreg.   [facility] He [facility] card.   Medication Adjustments/Labs and Tests Ordered: Current medicines are reviewed at length with the patient today.  Concerns regarding medicines are outlined above.  Medication changes, Labs and Tests ordered today are listed in the Patient Instructions below. Patient Instructions  Medication Instructions:  Your physician has recommended you make the following change in  your medication:   1. DECREASE: Aspirin to 81 mg once a day  2. STOP: metoprolol  3. START: carvedilol 6.25 mg tablet: Take 1 tablet by mouth twice a day  4. A prescription was sent in for amoxicillin for you to take prior to any dental procedures   Labwork: None ordered  Testing/Procedures: None ordered  Follow-Up: Your physician recommends that you establish with [facility]. [phone]   Your physician wants you to follow-up in: 6 months with Dr. Jens Som. You will receive a reminder letter in the mail two months in advance. If you don't receive a letter, please call our office to schedule the follow-up appointment.    Any Other Special Instructions Will Be Listed Below (If Applicable).  Endocarditis (Endocarditis)  La endocarditis es una infeccin en la capa interna del corazn (endocardio) o en las vlvulas cardacas. Puede producir vegetaciones en el corazn o en las vlvulas cardacas. Estas vegetaciones pueden destruir tejido cardaco y, con el tiempo, provocar insuficiencia cardaca. Tambin pueden causar ictus si se desprenden y forman un cogulo sanguneo en el  cerebro. CAUSAS  La endocarditis es provocada por grmenes que normalmente viven en el organismo. Los grmenes que suelen causar endocarditis son bacterias, aunque los hongos tambin pueden provocarla. FACTORES DE RIESGO Entre los factores de riesgo se incluyen los siguientes:  Tener un defecto cardaco.  Tener vlvulas cardacas artificiales (prtesis).  Tener una vlvula cardaca anormal o daada.  Tener antecedentes de endocarditis.  Leanor Rubenstein recibido un trasplante cardaco. Marnee Spring Los signos y sntomas pueden comenzar de forma repentina o pueden comenzar lentamente y Theme park manager de forma gradual. Algunos sntomas son los siguientes:  Teacher, English as a foreign language.  Escalofros.  Sudoracin nocturna.  Dolores musculares.  Fatiga.  Debilidad.  Falta de aire. Algunos signos son los siguientes:  Sonido anormal en el corazn (soplo).  Sangrado retinal.  Sangrado debajo de las uas de los dedos de las manos o de los pies.  Manchas rojas indoloras en las palmas de las manos.  Bultos dolorosos en la punta de los dedos de las manos o de los pies.  Hinchazn de los tobillos o los pies. DIAGNSTICO  Para realizar un diagnstico, el mdico puede hacer lo siguiente:  Medical sales representative examen fsico. Durante el examen, escuchar su corazn para determinar si hay un soplo. Tambin puede utilizar un endoscopio para comprobar si hay sangrado en las retinas.  Indicar exmenes. Estos pueden incluir los siguientes: ? Anlisis de sangre para buscar grmenes que causan endocarditis. ? Un ecocardiograma para obtener una imagen del corazn. TRATAMIENTO El tratamiento temprano ofrece las mejores probabilidades de curar la endocarditis y prevenir complicaciones. El tratamiento depender de la causa de la endocarditis. El tratamiento puede incluir lo siguiente:  Antibiticos. Estos pueden administrarse a travs de una va intravenosa (IV) o por va oral.  Ciruga para reemplazar la vlvula cardaca. Tal  vez necesite ciruga si: ? La endocarditis no responde al tratamiento. ? Presenta complicaciones. ? La vlvula cardaca est gravemente daada. INSTRUCCIONES PARA EL CUIDADO EN EL HOGAR  Tome los antibiticos como se lo haya indicado el mdico. Termine los antibiticos aunque comience a sentirse mejor.  Retome sus actividades habituales gradualmente.  Informe al mdico antes de someterse a cualquier procedimiento dental o quirrgico. Tal vez tenga que tomar antibiticos antes del procedimiento.  Informe a todos los mdicos y al dentista que ha tenido endocarditis.  No se haga tatuajes ni perforaciones (prsines) en el cuerpo.  No use drogas por va intravenosa salvo que  sean parte de su tratamiento mdico.  Mantenga una buena higiene bucal. Esto incluye lo siguiente: ? Cepillarse los dientes y Chemical engineer hilo Optician, dispensing. ? Asista a consultas dentales de rutina. SOLICITE ATENCIN MDICA SI:  Lance Muss.  Los sntomas no mejoran.  Los sntomas empeoran.  Los sntomas vuelven a Research officer, trade union. SOLICITE ATENCIN MDICA DE INMEDIATO SI:  Tiene dificultad para respirar.  Siente dolor en el pecho.  Tiene sntomas de ictus. Estos incluyen los siguientes: ? Debilidad repentina. ? Entumecimiento. ? Confusin. ? Dificultad para hablar. ? Dolor de cabeza intenso. Esta informacin no tiene Theme park manager el consejo del mdico. Asegrese de hacerle al mdico cualquier pregunta que tenga.   Carvedilol tablets Qu es este medicamento? El CARVEDILOL es un beta-bloqueante. Los beta-bloqueantes reducen la carga de trabajo del corazn y lo ayudan a latir ms regularmente. Este medicamento se [place] para tratar la alta presin sangunea y la insuficiencia cardiaca. Este medicamento puede ser utilizado para otros usos; si tiene alguna pregunta consulte con su proveedor de atencin mdica o con su farmacutico. MARCAS COMUNES: Coreg Qu le debo informar a mi profesional de la salud  antes de tomar este medicamento? Necesita saber si usted presenta alguno de los siguientes problemas o situaciones: -problemas de circulacin -diabetes -antecedentes de ataques o enfermedades cardacas -enfermedad heptica -enfermedad pulmonar o respiratoria, tales como enfisema o asma -feocromocitoma -pulso cardiaco lento o irregular -enfermedad tiroidea -una reaccin alrgica o inusual al carvedilol, otros beta-bloqueantes, medicamentos, alimentos, colorantes o conservantes -si est embarazada o buscando quedar embarazada -si est amamantando a un beb Cmo debo utilizar este medicamento? Tome este medicamento por va oral con un vaso de agua. Siga las instrucciones de la etiqueta del [place]. Es preferible tomar las tabletas con alimentos. Tome sus dosis a intervalos regulares. No tome su medicamento con una frecuencia mayor que la indicada. No deje de tomarlo excepto si as lo indica su mdico o su profesional de [employer]. Hable con su pediatra para informarse acerca del uso de este medicamento en nios. Puede requerir atencin especial. Sobredosis: Pngase en contacto inmediatamente con un centro toxicolgico o una sala de urgencia si usted cree que haya tomado demasiado medicamento. ATENCIN: [employer] es solo para usted. No comparta este medicamento con nadie. Qu sucede si me olvido de una dosis? Si olvida una dosis, tmela lo antes posible. Si es casi la hora de la prxima dosis, tome slo esa dosis. No tome dosis adicionales o dobles. Qu puede interactuar con este medicamento? Esta medicina puede interactuar con los siguientes medicamentos: -ciertos medicamentos para la presin sangunea, enfermedad cardiaca, pulso cardiaco irregular -ciertos medicamentos para la depresin, tales como fluoxetina o paroxetina -ciertos medicamentos para la diabetes, tales como glipizida o gliburida -cimetidina -clonidina -ciclosporina -digoxina -IMAOs, tales como Carbex, Eldepryl,  Marplan, [place] y [place] -reserpina -rifampicina Puede ser que esta lista no menciona todas las posibles interacciones. Informe a su profesional de [employer] de [employer] productos a base de hierbas, medicamentos de [place] o suplementos nutritivos que est tomando. Si usted fuma, consume bebidas alcohlicas o si utiliza drogas ilegales, indqueselo tambin a su profesional de [employer]. Algunas sustancias pueden interactuar con su medicamento. A qu debo estar atento al usar [employer]? Controle su presin sangunea y su frecuencia cardiaca regularmente mientras est tomando este medicamento. Pregunte a su mdico o a su profesional de la salud cules deben ser su frecuencia cardiaca y su presin sangunea y cundo deber comunicarse con l/ella. No suspenda este medicamento de  manera abrupta. Puede causarle serios problemas cardiacos. Comunquese con su mdico o con su profesional de la salud si tiene dificultad para Management consultant est tomando [employer]. Controle su peso todos [place]. Consulte con su mdico o con su profesional de la salud acerca de cundo debe informarle sobre los aumentos de [place]. Puede experimentar somnolencia o mareos. No conduzca ni utilice maquinaria ni haga nada que Scientist, research (life sciences) en estado de alerta hasta que sepa cmo le afecta este medicamento. Para reducir el riesgo de mareos o [place], no se siente ni se ponga de pie con rapidez. El alcohol puede provocar somnolencia e incrementar el enrojecimiento y el pulso cardiaco. Evite consumir bebidas alcohlicas. Si va a someterse a una operacin, informe a su mdico o a su profesional de la salud que est tomando [employer]. Qu efectos secundarios puedo tener al [employer] este medicamento? Efectos secundarios que debe informar a su mdico o a Producer, television/film/video de la salud tan pronto como sea posible: -Therapist, art como erupcin cutnea, picazn o urticarias, hinchazn de la cara, labios o  lengua -problemas respiratorios -orina oscura -latidos cardiacos irregulares -piernas o tobillos hinchados -vmito -color amarillento de los ojos o la piel Efectos secundarios que, por lo general, no requieren atencin mdica (debe informarlos a su mdico o a su profesional de la salud si persisten o si son molestos): -cambios del deseo sexual o funcin sexual -diarrea -ojos secos (especialmente si [place] lentes de Pharmacologist) -piel seca con picazn -dolor de cabeza -nuseas -cansancio inusual Puede ser que esta lista no menciona todos los posibles efectos secundarios. Comunquese a su mdico por asesoramiento mdico [employer]. Usted puede informar los efectos secundarios a la FDA por telfono al 1-800-FDA-1088. Dnde debo guardar mi medicina? Mantngala fuera del alcance de los nios. Gurdela a temperatura ambiente inferior a 30 grados C (86 grados F). Protjala de la humedad. Mantenga el envase bien cerrado. Deseche todo medicamento que no haya utilizado, despus de su fecha de vencimiento. ATENCIN: Este folleto es un resumen. Puede ser que no cubra toda la posible informacin. Si usted tiene preguntas acerca de esta medicina, consulte con su mdico, su farmacutico o su profesional de Radiographer, therapeutic.  If you need a refill on your cardiac medications before your next appointment, please call your pharmacy.      Lorelei Pont, [place]  09/02/2018 10:54 AM    [facility] [address] [place], [place], [place]  [address] Phone: [phone]; Fax: [phone]

## 2018-11-12 MED FILL — CARVEDILOL 6.25 MG TABLET: 6.25 | 30 days supply | Qty: 60 | Fill #1

## 2019-01-30 MED FILL — ?CARVEDILOL 6.25 MG TABLET: 6.25 | 30 days supply | Qty: 60 | Fill #2

## 2019-04-22 MED FILL — CARVEDILOL 6.25 MG TABLET: 6.25 | 30 days supply | Qty: 60 | Fill #3

## 2019-08-01 ENCOUNTER — Telehealth: Payer: Self-pay

## 2019-08-01 NOTE — Telephone Encounter (Signed)
Patient has not had an office visit since may 2019 when he saw provider Raul Del. He has a cardiologist on file who was prescribing his carvedilol. Please send request to cardiology. Nat Christen, CMA

## 2019-08-01 NOTE — Telephone Encounter (Signed)
Patient was scheduled and appointment to get refills for medication.

## 2019-08-01 NOTE — Telephone Encounter (Signed)
Patient is requesting a refill on Carvedilol sen to [facility].

## 2019-08-06 ENCOUNTER — Encounter (INDEPENDENT_AMBULATORY_CARE_PROVIDER_SITE_OTHER): Payer: Self-pay | Admitting: Primary Care

## 2019-08-06 ENCOUNTER — Ambulatory Visit (INDEPENDENT_AMBULATORY_CARE_PROVIDER_SITE_OTHER): Payer: Self-pay | Admitting: Primary Care

## 2019-08-06 ENCOUNTER — Other Ambulatory Visit: Payer: Self-pay

## 2019-08-06 DIAGNOSIS — R7303 Prediabetes: Secondary | ICD-10-CM

## 2019-08-06 DIAGNOSIS — Z952 Presence of prosthetic heart valve: Secondary | ICD-10-CM

## 2019-08-06 DIAGNOSIS — D649 Anemia, unspecified: Secondary | ICD-10-CM

## 2019-08-06 MED FILL — CARVEDILOL 6.25 MG TABLET: 6.25 | 30 days supply | Qty: 60 | Fill #4

## 2019-08-06 NOTE — Progress Notes (Signed)
CAFA expired in  December 2019 Pt states he ran out of medication last week.

## 2019-08-11 NOTE — Progress Notes (Signed)
Established Patient Office Visit  Subjective:  Patient ID: John Byrd, male    DOB: February 27, 1961  Age: 58 y.o. MRN: [id number]  CC:  Chief Complaint  Patient presents with  . New Patient (Initial Visit)    refill of carvedilol     HPI John KrebsValentino Santiago Griner presents to office today for medication fefill and his CAFFA expired 11/2018 . VRI was used to communicate directly with patient for the entire encounter including providing detailed patient instructions.   Past Medical History:  Diagnosis Date  . 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 Back pain   . CHF (congestive heart failure) (HCC)   . Frequent headaches   . Heart murmur   . Hypertension   . Muscle pain     Past Surgical History:  Procedure Laterality Date  . AORTIC VALVE REPLACEMENT N/A 03/11/2018   Procedure: AORTIC VALVE REPLACEMENT (AVR) USING 21 MM MAGNA EASE PERICARDIAL Greer EeBIOPROSTHESIS-AORTIC, MODEL 3300TFX, SERIAL # [id number];  Surgeon: Loreli SlotHendrickson, Steven C, MD;  [facility];  Service: Open Heart Surgery;  Laterality: N/A;  . No prior surgery    . VIDEO BRONCHOSCOPY N/A 03/11/2018   Procedure: VIDEO BRONCHOSCOPY;  Surgeon: Loreli SlotHendrickson, Steven C, MD;  [facility];  Service: Open Heart Surgery;  Laterality: N/A;    History reviewed. No pertinent family history.  Social History   Socioeconomic History  . Marital status: Significant Other    Spouse name: Not on file  . Number of children: Not on file  . Years of education: Not on file  . Highest education level: Not on file  Occupational History  . Not on file  Social Needs  . Financial resource strain: Not on file  . Food insecurity    Worry: Not on file    Inability: Not on file  . Transportation needs    Medical: Not on file    Non-medical: Not on file  Tobacco Use  . Smoking status: Never  Smoker  . Smokeless tobacco: Never Used  Substance and Sexual Activity  . Alcohol use: Yes  . Drug use: Not Currently  . Sexual activity: Not on file  Lifestyle  . Physical activity    Days per week: Not on file    Minutes per session: Not on file  . Stress: Not on file  Relationships  . Social Musicianconnections    Talks on phone: Not on file    Gets together: Not on file    Attends religious service: Not on file    Active member of club or organization: Not on file    Attends meetings of clubs or organizations: Not on file    Relationship status: Not on file  . Intimate partner violence    Fear of current or ex partner: Not on file    Emotionally abused: Not on file    Physically abused: Not on file    Forced sexual activity: Not on file  Other Topics Concern  . Not on file  Social History Narrative   Lives with girlfriend John Byrd and her son [place] in [place], [place]. Works as a Optometristconstruction worker driving forklifts. Spanish-speaking but knows some basic [place]. Has three adult daughters who live in [place].     Outpatient Medications Prior to Visit  Medication Sig Dispense Refill  . aspirin EC 81 MG tablet Take 1 tablet (81 mg total) by mouth  daily. (Patient not taking: Reported on 08/06/2019) 90 tablet 3  . carvedilol (COREG) 6.25 MG tablet Take 1 tablet (6.25 mg total) by mouth 2 (two) times daily. (Patient not taking: Reported on 08/06/2019) 180 tablet 3  . amoxicillin (AMOXIL) 500 MG capsule Take 4 caps (2000 mg total) 1 hour prior to any dental procedures 4 capsule 3   No facility-administered medications prior to visit.     No Known Allergies  ROS Review of Systems  All other systems reviewed and are negative.     Objective:    Physical Exam  Constitutional: He is oriented to person, place, and time. He appears well-developed and well-nourished.  HENT:  Head: Normocephalic.  Neck: Normal range of motion. Neck supple.  Cardiovascular: Normal rate and regular rhythm.   Abdominal: Soft. Bowel sounds are normal.  Musculoskeletal: Normal range of motion.  Neurological: He is oriented to person, place, and time.  Skin: Skin is warm and dry.  Psychiatric: He has a normal mood and affect.    There were no vitals taken for this visit. Wt Readings from Last 3 Encounters:  09/02/18 171 lb 3.2 oz (77.7 kg)  04/30/18 166 lb 3.2 oz (75.4 kg)  04/23/18 163 lb 6.4 oz (74.1 kg)     Health Maintenance Due  Topic Date Due  . TETANUS/TDAP  11/19/1979  . COLONOSCOPY  11/18/2010  . INFLUENZA VACCINE  07/05/2019    There are no preventive care reminders to display for this patient.  No results found for: TSH Lab Results  Component Value Date   WBC 8.2 03/19/2018   HGB 9.4 (L) 03/19/2018   HCT 29.2 (L) 03/19/2018   MCV 88.8 03/19/2018   PLT 340 03/19/2018   Lab Results  Component Value Date   NA 135 03/19/2018   K 3.9 03/19/2018   CO2 22 03/19/2018   GLUCOSE 113 (H) 03/19/2018   BUN 17 03/19/2018   CREATININE 0.70 03/19/2018   BILITOT 1.2 03/16/2018   ALKPHOS 150 (H) 03/16/2018   AST 71 (H) 03/16/2018   ALT 44 03/16/2018   PROT 6.7 03/16/2018   ALBUMIN 2.4 (L) 03/16/2018   CALCIUM 8.0 (L) 03/19/2018   ANIONGAP 7 03/19/2018   No results found for: CHOL No results found for: HDL No results found for: [facility] Lab Results  Component Value Date   TRIG 74 03/10/2018   No results found for: [facility] Lab Results  Component Value Date   HGBA1C 5.8 (H) 03/09/2018      Assessment & Plan:  John Byrd was seen today for new patient (initial visit).  Diagnoses and all orders for this visit:  Prediabetes Recommendations For Diabetic/Prediabetic Patients:   -  Take medications as prescribed  -  - Exercise at least 5 times a week for 30 minutes or preferably daily.  - No Smoking - Drink less than 2 drinks a day.  - Monitor your feet for sores - Have yearly Eye Exams - Recommend annual Flu vaccine  - Recommend Pneumovax and Prevnar  vaccines - Shingles Vaccine (Zostavax) if over 33 y.o. Goals:  - BMI less than 24 - Fasting sugar less than 130 or less than 150 if tapering medicines to lose weight  - Systolic BP less than 761  - Diastolic BP less than 80 - Bad LDL Cholesterol less than 70 - Triglycerides less than 150  S/P AVR Followed by cardiologist defer refills for refills. No medication prescribed advices to contact cardiologist for refills.  Loden was seen  today for new patient (initial visit).  Anemia, unspecified type History of Microcytic anaemia is characterized by small red blood cells. The normal mean corpuscular volume is approximately 80-100 fL. When the MCV is <80 fL, the red cells are described as microcytic and when >100 fL, macrocytic. The MCV is the average red blood cell size. Follow up CBC     Follow-up: Return in about 4 weeks (around 09/03/2019) for in person labs .    Grayce Sessions, NP

## 2019-08-12 ENCOUNTER — Encounter (INDEPENDENT_AMBULATORY_CARE_PROVIDER_SITE_OTHER): Payer: Self-pay | Admitting: Primary Care

## 2019-10-10 ENCOUNTER — Other Ambulatory Visit: Payer: Self-pay | Admitting: Physician Assistant

## 2019-10-10 MED FILL — CARVEDILOL 6.25 MG TABLET: 6.25 | 30 days supply | Qty: 60 | Fill #0

## 2019-10-20 ENCOUNTER — Other Ambulatory Visit: Payer: Self-pay

## 2019-10-20 ENCOUNTER — Ambulatory Visit (INDEPENDENT_AMBULATORY_CARE_PROVIDER_SITE_OTHER): Payer: Self-pay | Admitting: Primary Care

## 2019-10-20 VITALS — BP 127/84 | HR 88 | Temp 97.1°F | Ht 60.0 in | Wt 179.0 lb

## 2019-10-20 DIAGNOSIS — G8929 Other chronic pain: Secondary | ICD-10-CM

## 2019-10-20 DIAGNOSIS — M25562 Pain in left knee: Secondary | ICD-10-CM

## 2019-10-20 DIAGNOSIS — Z952 Presence of prosthetic heart valve: Secondary | ICD-10-CM

## 2019-10-20 DIAGNOSIS — I10 Essential (primary) hypertension: Secondary | ICD-10-CM

## 2019-10-20 DIAGNOSIS — M25561 Pain in right knee: Secondary | ICD-10-CM

## 2019-10-20 DIAGNOSIS — Z23 Encounter for immunization: Secondary | ICD-10-CM

## 2019-10-20 MED ORDER — CARVEDILOL 6.25 MG PO TABS: 6.2500 mg | ORAL_TABLET | Freq: Two times a day (BID) | ORAL | 3 refills | Status: DC

## 2019-10-20 MED ORDER — CARVEDILOL 6.25 MG PO TABS: 6.2500 mg | ORAL_TABLET | Freq: Two times a day (BID) | ORAL | 0 refills | Status: DC

## 2019-10-20 MED ORDER — IBUPROFEN 600 MG PO TABS: 600.0000 mg | ORAL_TABLET | Freq: Three times a day (TID) | ORAL | 1 refills | Status: DC | PRN

## 2019-10-20 MED FILL — IBUPROFEN 600 MG TABLET: 600 | 30 days supply | Qty: 90 | Fill #0

## 2019-10-20 NOTE — Progress Notes (Signed)
Acute Office Visit  Subjective:    Patient ID: John Byrd, male    DOB: 1961/02/16, 58 y.o.   MRN: [id number]  Chief Complaint  Patient presents with  . Hypertension    HPI Patient is in today for complaints of bilateral knee and feet pain walking he feels like makes the pain increase hypertension is controlled .  Past Medical History:  Diagnosis Date  . 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 Back pain   . CHF (congestive heart failure) (HCC)   . Frequent headaches   . Heart murmur   . Hypertension   . Muscle pain     Past Surgical History:  Procedure Laterality Date  . AORTIC VALVE REPLACEMENT N/A 03/11/2018   Procedure: AORTIC VALVE REPLACEMENT (AVR) USING 21 MM MAGNA EASE PERICARDIAL [name], MODEL 3300TFX, SERIAL # [id number];  Surgeon: Loreli Slot, MD;  Location[facility] OR;  Service: Open Heart Surgery;  Laterality: N/A;  . No prior surgery    . VIDEO BRONCHOSCOPY N/A 03/11/2018   Procedure: VIDEO BRONCHOSCOPY;  Surgeon: Loreli Slot, MD;  Location: [facility] OR;  Service: Open Heart Surgery;  Laterality: N/A;    No family history on file.  Social History   Socioeconomic History  . Marital status: Significant Other    Spouse name: Not on file  . Number of children: Not on file  . Years of education: Not on file  . Highest education level: Not on file  Occupational History  . Not on file  Social Needs  . Financial resource strain: Not on file  . Food insecurity    Worry: Not on file    Inability: Not on file  . Transportation needs    Medical: Not on file    Non-medical: Not on file  Tobacco Use  . Smoking status: Never Smoker  . Smokeless tobacco: Never Used  Substance and Sexual Activity  . Alcohol use: Yes  . Drug use: Not Currently  . Sexual activity: Not on file  Lifestyle  .  Physical activity    Days per week: Not on file    Minutes per session: Not on file  . Stress: Not on file  Relationships  . Social Musician on phone: Not on file    Gets together: Not on file    Attends religious service: Not on file    Active member of club or organization: Not on file    Attends meetings of clubs or organizations: Not on file    Relationship status: Not on file  . Intimate partner violence    Fear of current or ex partner: Not on file    Emotionally abused: Not on file    Physically abused: Not on file    Forced sexual activity: Not on file  Other Topics Concern  . Not on file  Social History Narrative   Lives with girlfriend [name] and her son [name] in [place], [place]. 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 Has three adult daughters who live in [place].     Outpatient Medications Prior to Visit  Medication Sig Dispense Refill  . carvedilol (COREG) 6.25 MG tablet Take 1 tablet (6.25 mg total) by mouth 2 (two) times daily. Pt needs to schedule appt with provider to get more refills - 1st attempt 60 tablet 0  .  aspirin EC 81 MG tablet Take 1 tablet (81 mg total) by mouth daily. (Patient not taking: Reported on 08/06/2019) 90 tablet 3   No facility-administered medications prior to visit.     No Known Allergies  Review of Systems  Musculoskeletal:       Bilateral knee pain, feet and legs       Objective:    Physical Exam  Constitutional: He is oriented to person, place, and time. He appears well-developed and well-nourished.  HENT:  Head: Normocephalic.  Neck: Neck supple.  Cardiovascular: Normal rate and regular rhythm.  Abdominal: Soft. Bowel sounds are normal. He exhibits distension.  Musculoskeletal: Normal range of motion.        General: Tenderness present.     Comments: Bilateral knees  Neurological: He is oriented to person, place, and time.  Psychiatric: He has a normal mood  and affect. His behavior is normal.    BP 127/84 (BP Location: Right Arm, Patient Position: Sitting, Cuff Size: Normal)   Pulse 88   Temp (!) 97.1 F (36.2 C) (Temporal)   Ht 5' (1.524 m)   Wt 179 lb (81.2 kg)   SpO2 93%   BMI 34.96 kg/m  Wt Readings from Last 3 Encounters:  10/20/19 179 lb (81.2 kg)  09/02/18 171 lb 3.2 oz (77.7 kg)  04/30/18 166 lb 3.2 oz (75.4 kg)    Health Maintenance Due  Topic Date Due  . COLONOSCOPY  11/18/2010    There are no preventive care reminders to display for this patient.   No results found for: TSH Lab Results  Component Value Date   WBC 8.2 03/19/2018   HGB 9.4 (L) 03/19/2018   HCT 29.2 (L) 03/19/2018   MCV 88.8 03/19/2018   PLT 340 03/19/2018   Lab Results  Component Value Date   NA 135 03/19/2018   K 3.9 03/19/2018   CO2 22 03/19/2018   GLUCOSE 113 (H) 03/19/2018   BUN 17 03/19/2018   CREATININE 0.70 03/19/2018   BILITOT 1.2 03/16/2018   ALKPHOS 150 (H) 03/16/2018   AST 71 (H) 03/16/2018   ALT 44 03/16/2018   PROT 6.7 03/16/2018   ALBUMIN 2.4 (L) 03/16/2018   CALCIUM 8.0 (L) 03/19/2018   ANIONGAP 7 03/19/2018   No results found for: CHOL No results found for: HDL No results found for: [facility] Lab Results  Component Value Date   TRIG 74 03/10/2018   No results found for: [facility] Lab Results  Component Value Date   HGBA1C 5.8 (H) 03/09/2018       Assessment & Plan:   Michel was seen today for hypertension.  Diagnoses and all orders for this visit:  Need for Tdap vaccination -     Tdap vaccine greater than or equal to 7yo IM  Need for immunization against influenza -     Flu Vaccine QUAD 36+ mos IM  Chronic pain of both knees Work on losing weight to help reduce joint pain. May alternate with heat and ice application for pain relief. May also alternate with acetaminophen and Ibuprofen as prescribed pain relief. Other alternatives include massage, acupuncture and water aerobics.  You must stay active  and avoid a sedentary lifestyle.  Prescribed NSAIDs ibuprofen 800 mg may use 3 times a day as needed for pain  S/P AVR (aortic valve replacement) Last seen cardiologist 08/2018 was placed on Coreg will continue Coreg 6.25 mg twice daily refills completed  Other orders -      -  carvedilol (COREG) 6.25 MG tablet; Take 1 tablet (6.25 mg total) by mouth 2 (two) times daily. Pt needs to schedule appt with provider to get more refills - 1st attempt -     ibuprofen (ADVIL) 600 MG tablet; Take 1 tablet (600 mg total) by mouth every 8 (eight) hours as needed.     Meds ordered this encounter  Medications  . DISCONTD: carvedilol (COREG) 6.25 MG tablet    Sig: Take 1 tablet (6.25 mg total) by mouth 2 (two) times daily. Pt needs to schedule appt with provider to get more refills - 1st attempt    Dispense:  60 tablet    Refill:  3    Pt needs to schedule appt with provider to get more refills - 1st attempt  . DISCONTD: carvedilol (COREG) 6.25 MG tablet    Sig: Take 1 tablet (6.25 mg total) by mouth 2 (two) times daily. Pt needs to schedule appt with provider to get more refills - 1st attempt    Dispense:  60 tablet    Refill:  0    Pt needs to schedule appt with provider to get more refills - 1st attempt  . ibuprofen (ADVIL) 600 MG tablet    Sig: Take 1 tablet (600 mg total) by mouth every 8 (eight) hours as needed.    Dispense:  90 tablet    Refill:  1  . carvedilol (COREG) 6.25 MG tablet    Sig: Take 1 tablet (6.25 mg total) by mouth 2 (two) times daily. Pt needs to schedule appt with provider to get more refills - 1st attempt    Dispense:  60 tablet    Refill:  3    Pt needs to schedule appt with provider to get more refills - 1st attempt     Grayce SessionsMichelle P Kaleisha Bhargava, NP

## 2019-10-20 NOTE — Progress Notes (Signed)
Foot pain/ leg pain- inflammation... walking makes worse

## 2019-10-20 NOTE — Patient Instructions (Addendum)
Dolor agudo de [employer] adultos Acute Knee Pain, Adult El dolor de rodilla puede tener muchas causas. A veces, el dolor de rodilla es repentino (agudo) y puede deberse a dao, hinchazn o irritacin de los msculos y tejidos que sostienen la rodilla. A menudo, el dolor desaparece solo con el tiempo y con reposo. Si el dolor no desaparece, pueden hacerse pruebas para hallar su causa. Siga estas indicaciones en su casa: Est atento a cualquier cambio en los sntomas. Tome estas medidas para Engineer, materials. Si tiene una rodillera o un dispositivo ortopdico:   Use la rodillera o el dispositivo ortopdico como se lo haya indicado el mdico. Quteselo solamente como se lo haya indicado el mdico.  Afloje la rodillera o el dispositivo ortopdico si los dedos de los pies: ? Hormiguean. ? Se adormecen. ? Se tornan fros y de [employer].  Mantenga la rodillera o el dispositivo ortopdico limpio.  Si la rodillera o el dispositivo ortopdico no es impermeable: ? No deje que se mojen. ? Cbralo con un envoltorio hermtico cuando tome un bao de inmersin o una ducha. Actividad  Descanse la rodilla.  No haga cosas que le causen dolor.  Evite las actividades en las que ambos pies no estn en contacto con el piso al mismo tiempo (actividades de alto impacto). Algunos ejemplos son correr, Public relations account executive soga y hacer saltos de tijera.  Trabaje con un fisioterapeuta para crear un programa de ejercicios seguros, como le haya indicado el mdico. Control del dolor, el entumecimiento y la hinchazn   Si se lo indican, aplique hielo sobre la rodilla: ? Ponga el hielo en una bolsa plstica. ? Coloque una [employer] piel y Copy. ? Coloque el hielo durante , 2 a 3veces por da.  Si se lo indican, aplique presin (compresin) sobre la rodilla lesionada para controlar la hinchazn, dar apoyo y ayudar a Paramedic las [place]. Una venda elstica sirve para la compresin. Indicaciones  generales  [employer] medicamentos solamente como se lo haya indicado el mdico.  Levante (eleve) la rodilla mientras est sentado o recostado. Asegrese de que la rodilla est en una posicin ms alta que el nivel del corazn.  Duerma con una almohada debajo de la rodilla.  No consuma ningn producto que contenga nicotina o tabaco. Estos incluyen cigarrillos, cigarrillos electrnicos y tabaco para Theatre manager. Estos productos pueden retrasar el proceso de curacin. Si necesita ayuda para dejar de fumar, consulte al mdico.  Si tiene sobrepeso, trabaje con su mdico y un experto en alimentos (nutricionista) para establecer metas para bajar de peso. El sobrepeso puede aumentar el dolor de rodilla.  Concurra a todas las visitas de control como se lo haya indicado el mdico. Esto es importante. Comunquese con un mdico si:  El dolor de rodilla no desaparece.  El dolor de rodilla cambia o [place].  Tiene fiebre junto con dolor de rodilla.  La rodilla se siente caliente cuando la toca.  La rodilla le falla o se le queda trabada. Solicite ayuda inmediatamente si:  La rodilla se le hincha, y la Barrister's clerk.  No puede mover la rodilla.  Siente mucho dolor en la rodilla. Resumen  El dolor de rodilla puede tener muchas causas. A menudo, el dolor desaparece solo con el tiempo y con reposo.  El mdico puede hacerle estudios para Financial risk analyst la causa del dolor.  Est atento a cualquier cambio en los sntomas. [employer] dolor con descanso, medicamentos, actividad de poca intensidad y [place]  uso de hielo.  Solicite ayuda de inmediato si no puede mover la rodilla o si el dolor de rodilla es muy intenso. Esta informacin no tiene Theme park managercomo fin reemplazar el consejo del mdico. Asegrese de hacerle al mdico cualquier pregunta que tenga. Document Released: 06/17/2014 Document Revised: 07/01/2018 Document Reviewed: 07/01/2018 Elsevier Patient Education  2020 Elsevier Inc.   Hipertensin en los  adultos Hypertension, Adult La presin arterial alta (hipertensin) se produce cuando la fuerza de la sangre bombea a travs de las arterias con mucha fuerza. Las arterias son los vasos sanguneos que transportan la sangre desde el corazn al resto del cuerpo. La hipertensin hace que el corazn haga ms esfuerzo para Insurance account managerbombear sangre y Sears Holdings Corporationpuede provocar que las arterias se Armed forces training and education officerestrechen o Multimedia programmerendurezcan. La hipertensin no tratada o no controlada puede causar infarto de miocardio, insuficiencia cardaca, accidente cerebrovascular, enfermedad renal y otros problemas. Una lectura de la presin arterial consta de un nmero ms alto sobre un nmero ms bajo. En condiciones ideales, la presin arterial debe estar por debajo de 120/80. El primer nmero ("superior") es la presin sistlica. Es la medida de la presin de las arterias cuando el corazn late. El segundo nmero ("inferior") es la presin diastlica. Es la medida de la presin en las arterias cuando el corazn se relaja. Cules son las causas? Se desconoce la causa exacta de esta afeccin. Hay algunas afecciones que causan presin arterial alta o estn relacionadas con ella. Qu incrementa el riesgo? Algunos factores de riesgo de hipertensin estn bajo su control. Los siguientes factores pueden hacer que sea ms propenso a Aeronautical engineerdesarrollar esta afeccin:  Fumar.  Tener diabetes mellitus tipo 2, colesterol alto, o ambos.  No hacer la cantidad suficiente de actividad fsica o ejercicio.  Tener sobrepeso.  Consumir mucha grasa, azcar, caloras o sal (sodio) en su dieta.  Beber alcohol en exceso. Algunos factores de riesgo para la presin arterial alta pueden ser difciles o imposibles de Multimedia programmercambiar. Algunos de estos factores son los siguientes:  Tener enfermedad renal crnica.  Tener antecedentes familiares de presin arterial alta.  Edad. Los riesgos aumentan con la edad.  Raza. El riesgo es mayor para las Statisticianpersonas afroamericanas.  Sexo. Antes de  los 45aos, los hombres corren ms Goodyear Tireriesgo que las mujeres. Despus de los 65aos, las mujeres corren ms Lexmark Internationalriesgo que los hombres.  Tener apnea obstructiva del sueo.  Estrs. Cules son los signos o los sntomas? Es posible que la presin arterial alta puede no cause sntomas. La presin arterial muy alta (crisis hipertensiva) puede provocar:  Dolor de cabeza.  Ansiedad.  Falta de aire.  Hemorragia nasal.  Nuseas y vmitos.  Cambios en la visin.  Dolor de pecho intenso.  Convulsiones. Cmo se diagnostica? Esta afeccin se diagnostica al medir su presin arterial mientras se encuentra sentado, con el brazo apoyado sobre una superficie plana, las piernas sin cruzar y los pies bien apoyados en el piso. El brazalete del tensimetro debe colocarse directamente sobre la piel de la parte superior del brazo y al nivel de su corazn. Debe medirla al [facility] Residential Treatment Centermenos dos veces en el mismo brazo. Determinadas condiciones pueden causar una diferencia de presin arterial entre el brazo izquierdo y Aeronautical engineerel derecho. Ciertos factores pueden provocar que las lecturas de la presin arterial sean inferiores o superiores a lo normal por un perodo corto de tiempo:  Si su presin arterial es ms alta cuando se encuentra en el consultorio del mdico que cuando la mide en su hogar, se denomina "hipertensin de bata blanca". La  mayora de las personas que tienen esta afeccin no deben ser [employer].  Si su presin arterial es ms alta en el hogar que cuando se encuentra en el consultorio del mdico, se denomina "hipertensin enmascarada". La Har[employer]son de las personas que tienen esta afeccin deben ser medicadas para Chief Operating Officer la presin arterial. Si tiene una lecturas de presin arterial alta durante una visita o si tiene presin arterial normal con otros factores de riesgo, se le podr pedir que haga lo siguiente:  Que regrese otro da para volver a Chief Operating Officer su presin arterial nuevamente.  Que se controle la presin  arterial en su casa durante 1 semana o ms. Si se le diagnostica hipertensin, es posible que se le realicen otros anlisis de sangre o estudios de diagnstico por imgenes para ayudar a su mdico a comprender su riesgo general de tener otras afecciones. Cmo se trata? Esta afeccin se trata haciendo cambios saludables en el estilo de vida, tales como ingerir alimentos saludables, realizar ms ejercicio y reducir el consumo de alcohol. El mdico puede recetarle medicamentos si los cambios en el estilo de vida no son suficientes para Museum/gallery curator la presin arterial y si:  Su presin arterial sistlica est por encima de 130.  Su presin arterial diastlica est por encima de 80. La presin arterial deseada puede variar en funcin de las enfermedades, la edad y otros factores personales. Siga estas instrucciones en su casa: Comida y bebida   Siga una dieta con alto contenido de fibras y [place], y con bajo contenido de sodio, International aid/development worker agregada y Neurosurgeon. Un ejemplo de plan alimenticio es la dieta DASH (Dietary Approaches to Stop Hypertension, Mtodos alimenticios para detener la hipertensin). Para alimentarse de esta manera: ? Coma mucha fruta y verdura fresca. Trate de que la mitad del plato de cada comida sea de frutas y verduras. ? Coma cereales integrales, como pasta integral, arroz integral o pan integral. Llene aproximadamente un cuarto del plato con cereales integrales. ? Coma y beba productos lcteos con bajo contenido de grasa, como leche descremada o yogur bajo en grasas. ? Evite la ingesta de cortes de carne grasa, carne procesada o curada, y carne de ave con piel. Llene aproximadamente un cuarto del plato con protenas magras, como pescado, pollo sin piel, frijoles, huevos o tofu. ? Evite ingerir alimentos prehechos y procesados. En general, estos tienen mayor cantidad de sodio, azcar agregada y Ste[place]anie [place].  Reduzca su ingesta diaria de sodio. La mayora de las personas que tienen  hipertensin deben comer menos de 1500 mg de sodio por [employer].  No beba alcohol si: ? Su mdico le indica no hacerlo. ? Est embarazada, puede estar embarazada o est tratando de quedar embarazada.  Si bebe alcohol: ? Limite la cantidad que bebe a lo siguiente:  De 0 a 1 medida por da para las mujeres.  De 0 a 2 medidas por da para los hombres. ? Est atento a la cantidad de alcohol que hay en las bebidas que toma. En los [place], una medida equivale a una botella de cerveza de 12oz ( ), un vaso de vino de 5oz ( ) o un vaso de una bebida alcohlica de alta graduacin de 1oz (38ml). Estilo de vida   Trabaje con su mdico para mantener un peso saludable o Curator. Pregntele cul es el peso recomendado para usted.  Haga al menos de ejercicio la [employer]V de la [place]. Estas actividades pueden incluir caminar, nadar o andar en bicicleta.  Incluya ejercicios para  fortalecer sus msculos (ejercicios de resistencia), como [place] o levantamiento de pesas, como parte de su rutina semanal de ejercicios. Intente realizar 37minutos de este tipo de ejercicios al [employer] a la [place].  No consuma ningn producto que contenga nicotina o tabaco, como cigarrillos, cigarrillos electrnicos y tabaco de Higher education careers adviser. Si necesita ayuda para dejar de fumar, consulte al mdico.  Contrlese la presin arterial en su casa segn las indicaciones del mdico.  Concurra a todas las visitas de seguimiento como se lo haya indicado el mdico. Esto es importante. Medicamentos  [employer] de venta libre y los recetados solamente como se lo haya indicado el mdico. Siga cuidadosamente las indicaciones. Los medicamentos para la presin arterial deben tomarse segn las indicaciones.  No omita las dosis de medicamentos para la presin arterial. Si lo hace, estar en riesgo de tener problemas y puede hacer que los medicamentos sean menos eficaces.  Pregntele a  su mdico a qu efectos secundarios o reacciones a los Careers information officer. Comunquese con un mdico si:  Piensa que tiene una reaccin a un medicamento que est tomando.  Tiene dolores de cabeza frecuentes (recurrentes).  Se siente mareado.  Tiene hinchazn en los tobillos.  Tiene problemas de visin. Solicite ayuda inmediatamente si:  Siente un dolor de cabeza intenso o confusin.  Siente debilidad inusual o adormecimiento.  Siente que va a desmayarse.  Siente un dolor intenso en el pecho o el abdomen.  Vomita repetidas veces.  Tiene dificultad para respirar. Resumen  La hipertensin se produce cuando la sangre bombea en las arterias con mucha fuerza. Si esta afeccin no se controla, podra correr riesgo de tener complicaciones graves.  La presin arterial deseada puede variar en funcin de las enfermedades, la edad y otros factores personales. Para la [employer], una presin arterial normal es menor que 120/80.  La hipertensin se trata con cambios en el estilo de vida, medicamentos o una combinacin de [place]. Los McDonald[employer] Corporation estilo de vida incluyen prdida de peso, ingerir alimentos sanos, seguir una dieta baja en sodio, hacer ms ejercicio y Environmental consultant consumo de alcohol. Esta informacin no tiene Marine scientist el consejo del mdico. Asegrese de hacerle al mdico cualquier pregunta que tenga. Document Released: 11/20/2005 Document Revised: 09/05/2018 Document Reviewed: 09/05/2018 Elsevier Patient Education  2020 [employer].

## 2019-10-26 MED ORDER — CARVEDILOL 6.25 MG PO TABS: 6.2500 mg | ORAL_TABLET | Freq: Two times a day (BID) | ORAL | 3 refills | Status: DC

## 2019-11-29 ENCOUNTER — Inpatient Hospital Stay (HOSPITAL_COMMUNITY)
Admission: EM | Admit: 2019-11-29 | Discharge: 2019-12-14 | DRG: 957 | Disposition: A | Discharge status: Home Health | Payer: No Typology Code available for payment source | Attending: Internal Medicine | Admitting: Internal Medicine

## 2019-11-29 ENCOUNTER — Encounter (HOSPITAL_COMMUNITY): Payer: Self-pay | Admitting: Physician Assistant

## 2019-11-29 ENCOUNTER — Inpatient Hospital Stay (HOSPITAL_COMMUNITY): Payer: No Typology Code available for payment source

## 2019-11-29 ENCOUNTER — Emergency Department (HOSPITAL_COMMUNITY): Payer: No Typology Code available for payment source

## 2019-11-29 ENCOUNTER — Other Ambulatory Visit (HOSPITAL_COMMUNITY): Payer: Self-pay

## 2019-11-29 DIAGNOSIS — S066X9A Traumatic subarachnoid hemorrhage with loss of consciousness of unspecified duration, initial encounter: Secondary | ICD-10-CM | POA: Diagnosis present

## 2019-11-29 DIAGNOSIS — J1282 Pneumonia due to coronavirus disease 2019: Secondary | ICD-10-CM | POA: Diagnosis not present

## 2019-11-29 DIAGNOSIS — I10 Essential (primary) hypertension: Secondary | ICD-10-CM | POA: Diagnosis present

## 2019-11-29 DIAGNOSIS — J939 Pneumothorax, unspecified: Secondary | ICD-10-CM

## 2019-11-29 DIAGNOSIS — S139XXA Sprain of joints and ligaments of unspecified parts of neck, initial encounter: Secondary | ICD-10-CM

## 2019-11-29 DIAGNOSIS — S2242XA Multiple fractures of ribs, left side, initial encounter for closed fracture: Secondary | ICD-10-CM | POA: Diagnosis present

## 2019-11-29 DIAGNOSIS — R7303 Prediabetes: Secondary | ICD-10-CM | POA: Diagnosis present

## 2019-11-29 DIAGNOSIS — U071 COVID-19: Secondary | ICD-10-CM | POA: Diagnosis present

## 2019-11-29 DIAGNOSIS — J9601 Acute respiratory failure with hypoxia: Secondary | ICD-10-CM | POA: Diagnosis not present

## 2019-11-29 DIAGNOSIS — Z8616 Personal history of COVID-19: Secondary | ICD-10-CM | POA: Diagnosis present

## 2019-11-29 DIAGNOSIS — E860 Dehydration: Secondary | ICD-10-CM | POA: Diagnosis not present

## 2019-11-29 DIAGNOSIS — S32402A Unspecified fracture of left acetabulum, initial encounter for closed fracture: Secondary | ICD-10-CM | POA: Diagnosis present

## 2019-11-29 DIAGNOSIS — I959 Hypotension, unspecified: Secondary | ICD-10-CM | POA: Diagnosis not present

## 2019-11-29 DIAGNOSIS — Z789 Other specified health status: Secondary | ICD-10-CM

## 2019-11-29 DIAGNOSIS — S270XXA Traumatic pneumothorax, initial encounter: Secondary | ICD-10-CM | POA: Diagnosis present

## 2019-11-29 DIAGNOSIS — S36039A Unspecified laceration of spleen, initial encounter: Principal | ICD-10-CM | POA: Diagnosis present

## 2019-11-29 DIAGNOSIS — S32409A Unspecified fracture of unspecified acetabulum, initial encounter for closed fracture: Secondary | ICD-10-CM

## 2019-11-29 DIAGNOSIS — R58 Hemorrhage, not elsewhere classified: Secondary | ICD-10-CM

## 2019-11-29 DIAGNOSIS — S065X9A Traumatic subdural hemorrhage with loss of consciousness of unspecified duration, initial encounter: Secondary | ICD-10-CM | POA: Diagnosis present

## 2019-11-29 DIAGNOSIS — I609 Nontraumatic subarachnoid hemorrhage, unspecified: Secondary | ICD-10-CM

## 2019-11-29 DIAGNOSIS — S0093XA Contusion of unspecified part of head, initial encounter: Secondary | ICD-10-CM

## 2019-11-29 DIAGNOSIS — T148XXA Other injury of unspecified body region, initial encounter: Secondary | ICD-10-CM

## 2019-11-29 DIAGNOSIS — M542 Cervicalgia: Secondary | ICD-10-CM

## 2019-11-29 DIAGNOSIS — Z951 Presence of aortocoronary bypass graft: Secondary | ICD-10-CM

## 2019-11-29 DIAGNOSIS — Z952 Presence of prosthetic heart valve: Secondary | ICD-10-CM | POA: Diagnosis not present

## 2019-11-29 DIAGNOSIS — S3600XA Unspecified injury of spleen, initial encounter: Secondary | ICD-10-CM

## 2019-11-29 HISTORY — PX: IR ANGIOGRAM VISCERAL SELECTIVE: IMG657

## 2019-11-29 HISTORY — PX: IR US GUIDE VASC ACCESS RIGHT: IMG2390

## 2019-11-29 HISTORY — PX: IR EMBO ART  VEN HEMORR LYMPH EXTRAV  INC GUIDE ROADMAPPING: IMG5450

## 2019-11-29 HISTORY — PX: IR ANGIOGRAM SELECTIVE EACH ADDITIONAL VESSEL: IMG667

## 2019-11-29 LAB — SAMPLE TO BLOOD BANK

## 2019-11-29 LAB — COMPREHENSIVE METABOLIC PANEL
ALT: 58 U/L — ABNORMAL HIGH (ref 0–44)
AST: 94 U/L — ABNORMAL HIGH (ref 15–41)
Albumin: 3.9 g/dL (ref 3.5–5.0)
Alkaline Phosphatase: 79 U/L (ref 38–126)
Anion gap: 12 (ref 5–15)
BUN: 13 mg/dL (ref 6–20)
CO2: 20 mmol/L — ABNORMAL LOW (ref 22–32)
Calcium: 8.5 mg/dL — ABNORMAL LOW (ref 8.9–10.3)
Chloride: 101 mmol/L (ref 98–111)
Creatinine, Ser: 1.21 mg/dL (ref 0.61–1.24)
GFR calc Af Amer: >60 mL/min (ref >60)
GFR calc non Af Amer: >60 mL/min (ref >60)
Glucose, Bld: 153 mg/dL — ABNORMAL HIGH (ref 70–99)
Potassium: 3.8 mmol/L (ref 3.5–5.1)
Sodium: 133 mmol/L — ABNORMAL LOW (ref 135–145)
Total Bilirubin: 0.8 mg/dL (ref 0.3–1.2)
Total Protein: 7.4 g/dL (ref 6.5–8.1)

## 2019-11-29 LAB — URINALYSIS, ROUTINE W REFLEX MICROSCOPIC
Bacteria, UA: NONE SEEN
Bilirubin Urine: NEGATIVE
Glucose, UA: NEGATIVE mg/dL
Ketones, ur: NEGATIVE mg/dL
Leukocytes,Ua: NEGATIVE
Nitrite: NEGATIVE
Protein, ur: 30 mg/dL — AB
RBC / HPF: >50 RBC/hpf — ABNORMAL HIGH (ref 0–5)
Specific Gravity, Urine: >1.046 — ABNORMAL HIGH (ref 1.005–1.030)
pH: 6 (ref 5.0–8.0)

## 2019-11-29 LAB — CBC
HCT: 44.2 % (ref 39.0–52.0)
Hemoglobin: 14.8 g/dL (ref 13.0–17.0)
MCH: 30.8 pg (ref 26.0–34.0)
MCHC: 33.5 g/dL (ref 30.0–36.0)
MCV: 91.9 fL (ref 80.0–100.0)
Platelets: 220 10*3/uL (ref 150–400)
RBC: 4.81 MIL/uL (ref 4.22–5.81)
RDW: 12.2 % (ref 11.5–15.5)
WBC: 8.4 10*3/uL (ref 4.0–10.5)
nRBC: 0 % (ref 0.0–0.2)

## 2019-11-29 LAB — HEMOGLOBIN AND HEMATOCRIT, BLOOD
HCT: 42.4 % (ref 39.0–52.0)
Hemoglobin: 14.4 g/dL (ref 13.0–17.0)

## 2019-11-29 LAB — RESPIRATORY PANEL BY RT PCR (FLU A&B, COVID)
Influenza A by PCR: NEGATIVE
Influenza B by PCR: NEGATIVE
SARS Coronavirus 2 by RT PCR: POSITIVE — AB

## 2019-11-29 LAB — RAPID URINE DRUG SCREEN, HOSP PERFORMED
Amphetamines: NOT DETECTED
Barbiturates: NOT DETECTED
Benzodiazepines: POSITIVE — AB
Cocaine: NOT DETECTED
Opiates: NOT DETECTED
Tetrahydrocannabinol: NOT DETECTED

## 2019-11-29 LAB — HIV ANTIBODY (ROUTINE TESTING W REFLEX): HIV Screen 4th Generation wRfx: NONREACTIVE

## 2019-11-29 LAB — MRSA PCR SCREENING: MRSA by PCR: NEGATIVE

## 2019-11-29 LAB — ETHANOL: Alcohol, Ethyl (B): <10 mg/dL (ref <10)

## 2019-11-29 LAB — ABO/RH: ABO/RH(D): B POS

## 2019-11-29 MED ORDER — HYDROMORPHONE HCL 1 MG/ML IJ SOLN
0.5000 mg | INTRAMUSCULAR | Status: DC | PRN
  Administered 2019-11-29 – 2019-11-30 (×2): 0.5 mg via INTRAVENOUS
  Filled 2019-11-29 (×2): qty 1

## 2019-11-29 MED ORDER — LACTATED RINGERS IV SOLN: INTRAVENOUS | Status: DC

## 2019-11-29 MED ORDER — HYDROMORPHONE HCL 1 MG/ML IJ SOLN
1.0000 mg | Freq: Once | INTRAMUSCULAR | Status: AC
  Administered 2019-11-29: 10:00:00 1 mg via INTRAVENOUS

## 2019-11-29 MED ORDER — ONDANSETRON HCL 4 MG/2ML IJ SOLN: 4.0000 mg | Freq: Four times a day (QID) | INTRAMUSCULAR | Status: DC | PRN

## 2019-11-29 MED ORDER — IOHEXOL 300 MG/ML  SOLN
150.0000 mL | Freq: Once | INTRAMUSCULAR | Status: AC | PRN
  Administered 2019-11-29: 24 mL via INTRA_ARTERIAL

## 2019-11-29 MED ORDER — HYDROMORPHONE HCL 1 MG/ML IJ SOLN
INTRAMUSCULAR | Status: AC
  Filled 2019-11-29: qty 1

## 2019-11-29 MED ORDER — LIDOCAINE HCL 1 % IJ SOLN
INTRAMUSCULAR | Status: AC | PRN
  Administered 2019-11-29: 5 mL

## 2019-11-29 MED ORDER — MIDAZOLAM HCL 2 MG/2ML IJ SOLN
INTRAMUSCULAR | Status: AC
  Filled 2019-11-29: qty 2

## 2019-11-29 MED ORDER — MIDAZOLAM HCL 2 MG/2ML IJ SOLN
INTRAMUSCULAR | Status: AC | PRN
  Administered 2019-11-29: 1 mg via INTRAVENOUS
  Administered 2019-11-29 (×2): 0.5 mg via INTRAVENOUS

## 2019-11-29 MED ORDER — ONDANSETRON HCL 4 MG/2ML IJ SOLN
4.0000 mg | Freq: Once | INTRAMUSCULAR | Status: AC
  Administered 2019-11-29: 10:00:00 4 mg via INTRAVENOUS

## 2019-11-29 MED ORDER — IOHEXOL 300 MG/ML  SOLN
50.0000 mL | Freq: Once | INTRAMUSCULAR | Status: AC | PRN
  Administered 2019-11-29: 50 mL via INTRAVENOUS

## 2019-11-29 MED ORDER — TETANUS-DIPHTH-ACELL PERTUSSIS 5-2.5-18.5 LF-MCG/0.5 IM SUSP
0.5000 mL | Freq: Once | INTRAMUSCULAR | Status: AC
  Administered 2019-11-29: 11:00:00 0.5 mL via INTRAMUSCULAR
  Filled 2019-11-29: qty 0.5

## 2019-11-29 MED ORDER — OXYCODONE HCL 5 MG PO TABS: 5.0000 mg | ORAL_TABLET | Freq: Four times a day (QID) | ORAL | Status: DC | PRN

## 2019-11-29 MED ORDER — IOHEXOL 300 MG/ML  SOLN
150.0000 mL | Freq: Once | INTRAMUSCULAR | Status: AC | PRN
  Administered 2019-11-29: 36 mL via INTRA_ARTERIAL

## 2019-11-29 MED ORDER — CARVEDILOL 6.25 MG PO TABS
6.2500 mg | ORAL_TABLET | Freq: Two times a day (BID) | ORAL | Status: DC
  Administered 2019-11-30 – 2019-12-12 (×23): 6.25 mg via ORAL
  Filled 2019-11-29: qty 2
  Filled 2019-11-29: qty 1
  Filled 2019-11-29: qty 2
  Filled 2019-11-29 (×12): qty 1
  Filled 2019-11-29 (×2): qty 2
  Filled 2019-11-29 (×7): qty 1
  Filled 2019-11-29: qty 2
  Filled 2019-11-29: qty 1
  Filled 2019-11-29 (×2): qty 2

## 2019-11-29 MED ORDER — IOHEXOL 300 MG/ML  SOLN
100.0000 mL | Freq: Once | INTRAMUSCULAR | Status: AC | PRN
  Administered 2019-11-29: 100 mL via INTRAVENOUS

## 2019-11-29 MED ORDER — DOCUSATE SODIUM 100 MG PO CAPS
200.0000 mg | ORAL_CAPSULE | Freq: Two times a day (BID) | ORAL | Status: DC
  Administered 2019-11-30: 12:00:00 200 mg via ORAL
  Filled 2019-11-29: qty 2

## 2019-11-29 MED ORDER — ACETAMINOPHEN 500 MG PO TABS
1000.0000 mg | ORAL_TABLET | Freq: Four times a day (QID) | ORAL | Status: DC
  Administered 2019-11-29 – 2019-12-06 (×23): 1000 mg via ORAL
  Administered 2019-12-06: 500 mg via ORAL
  Administered 2019-12-06 – 2019-12-08 (×6): 1000 mg via ORAL
  Filled 2019-11-29 (×34): qty 2

## 2019-11-29 MED ORDER — LIDOCAINE HCL 1 % IJ SOLN
INTRAMUSCULAR | Status: AC
  Filled 2019-11-29: qty 20

## 2019-11-29 MED ORDER — ONDANSETRON HCL 4 MG/2ML IJ SOLN
INTRAMUSCULAR | Status: AC
  Filled 2019-11-29: qty 2

## 2019-11-29 MED ORDER — FENTANYL CITRATE (PF) 100 MCG/2ML IJ SOLN
INTRAMUSCULAR | Status: AC | PRN
  Administered 2019-11-29 (×4): 25 ug via INTRAVENOUS

## 2019-11-29 MED ORDER — ONDANSETRON 4 MG PO TBDP: 4.0000 mg | ORAL_TABLET | Freq: Four times a day (QID) | ORAL | Status: DC | PRN

## 2019-11-29 MED ORDER — FENTANYL CITRATE (PF) 100 MCG/2ML IJ SOLN
INTRAMUSCULAR | Status: AC
  Filled 2019-11-29: qty 4

## 2019-11-29 NOTE — ED Notes (Signed)
IR PA at the bedside discussing procedure with pt

## 2019-11-29 NOTE — ED Notes (Signed)
Patient transported to CT 
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
## 2019-11-29 NOTE — ED Notes (Signed)
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     Signed and Held          Vitals/Pain Today's Vitals   11/29/19 1435 11/29/19 1453 11/29/19 1530 11/29/19 1600  BP: 93/75 111/75 111/84 115/83  Pulse: 90 84 86 90  Resp: 19 20 (!) 22 (!) 23  Temp:      TempSrc:      SpO2: 96% 96% 94%  96%  Weight:      Height:      PainSc:  0-No pain      Isolation Precautions Airborne and Contact precautions  Medications Medications  lidocaine (XYLOCAINE) 1 % (with pres) injection (has no administration in time range)  lidocaine (XYLOCAINE) 1 % (with pres) injection (5 mLs Infiltration Given 11/29/19 1341)  midazolam (VERSED) injection (0.5 mg Intravenous Given 11/29/19 1426)  fentaNYL (SUBLIMAZE) injection (25 mcg Intravenous Given 11/29/19 1426)  HYDROmorphone (DILAUDID) injection 1 mg (1 mg Intravenous Given 11/29/19 0953)  ondansetron (ZOFRAN) injection 4 mg (4 mg Intravenous Given 11/29/19 0953)  Tdap (BOOSTRIX) injection 0.5 mL (0.5 mLs Intramuscular Given 11/29/19 1123)  iohexol (OMNIPAQUE) 300 MG/ML solution 100 mL (100 mLs Intravenous Contrast Given 11/29/19 1049)  iohexol (OMNIPAQUE) 300 MG/ML solution 150 mL (24 mLs Intra-arterial Contrast Given 11/29/19 1445)  iohexol (OMNIPAQUE) 300 MG/ML solution 150 mL (36 mLs Intra-arterial Contrast Given 11/29/19 1445)  iohexol (OMNIPAQUE) 300 MG/ML solution 50 mL (50 mLs Intravenous Contrast Given 11/29/19 1639)    Mobility walks     Focused Assessments Neuro Assessment Handoff:  Swallow screen pass? No  Cardiac Rhythm: Normal sinus rhythm       Neuro Assessment:   Neuro Checks:      Last Documented NIHSS Modified Score:   Has TPA been given? No If patient is a Neuro Trauma and patient is going to OR before floor call report to 4N Charge nurse: [phone] or [phone]     R Recommendations: See Admitting Provider Note  Report given to:   Additional Notes:

## 2019-11-29 NOTE — Procedures (Signed)
Interventional Radiology Procedure Note  Procedure: Visceral arteriography of splenic artery and branches; embolization of splenic artery branch   Complications: None  Estimated Blood Loss: < 10 mL  Findings: Multifocal parenchymal contrast extravasation during arterial phase in posterior/lower spleen. Able to localize to one supplying lower pole branch and treated with a 3 mm x 5 cm Ruby coil with cessation of extravasation.   Venetia Night. Kathlene Cote, M.D Pager:  [phone]
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
## 2019-11-29 NOTE — Progress Notes (Signed)
Orthopedic Tech Progress Note Patient Details:  John Byrd 12/10/1960 [id number]  Patient ID: John Byrd, male   DOB: [birth year], 58 y.o.   MRN: [id number]   John Byrd 11/29/2019, 9:52 AMlevel 2 Trauma
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
## 2019-11-29 NOTE — ED Notes (Signed)
Accompanied patient to CT, returned to room 31. VSS.
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
## 2019-11-29 NOTE — ED Notes (Signed)
Right femoral site; dressing in place; C/D/I; Informed patient to keep leg straight. Patient verbalized understanding.
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
## 2019-11-29 NOTE — ED Notes (Signed)
Pt oxygen increased to 4L

## 2019-11-29 NOTE — ED Notes (Signed)
edp back to the bedside due to change in pt neuro status and blood pressure

## 2019-11-30 ENCOUNTER — Inpatient Hospital Stay (HOSPITAL_COMMUNITY): Payer: No Typology Code available for payment source

## 2019-11-30 ENCOUNTER — Encounter (HOSPITAL_COMMUNITY): Payer: Self-pay

## 2019-11-30 DIAGNOSIS — S32402A Unspecified fracture of left acetabulum, initial encounter for closed fracture: Secondary | ICD-10-CM

## 2019-11-30 HISTORY — DX: Unspecified fracture of left acetabulum, initial encounter for closed fracture: S32.402A

## 2019-11-30 LAB — HEMOGLOBIN AND HEMATOCRIT, BLOOD
HCT: 38.3 % — ABNORMAL LOW (ref 39.0–52.0)
Hemoglobin: 13.1 g/dL (ref 13.0–17.0)

## 2019-11-30 LAB — COMPREHENSIVE METABOLIC PANEL
ALT: 68 U/L — ABNORMAL HIGH (ref 0–44)
AST: 109 U/L — ABNORMAL HIGH (ref 15–41)
Albumin: 3.2 g/dL — ABNORMAL LOW (ref 3.5–5.0)
Alkaline Phosphatase: 67 U/L (ref 38–126)
Anion gap: 8 (ref 5–15)
BUN: 12 mg/dL (ref 6–20)
CO2: 21 mmol/L — ABNORMAL LOW (ref 22–32)
Calcium: 7.9 mg/dL — ABNORMAL LOW (ref 8.9–10.3)
Chloride: 107 mmol/L (ref 98–111)
Creatinine, Ser: 0.84 mg/dL (ref 0.61–1.24)
GFR calc Af Amer: >60 mL/min (ref >60)
GFR calc non Af Amer: >60 mL/min (ref >60)
Glucose, Bld: 128 mg/dL — ABNORMAL HIGH (ref 70–99)
Potassium: 3.5 mmol/L (ref 3.5–5.1)
Sodium: 136 mmol/L (ref 135–145)
Total Bilirubin: 0.6 mg/dL (ref 0.3–1.2)
Total Protein: 6.1 g/dL — ABNORMAL LOW (ref 6.5–8.1)

## 2019-11-30 LAB — CBC
HCT: 37.9 % — ABNORMAL LOW (ref 39.0–52.0)
Hemoglobin: 13.1 g/dL (ref 13.0–17.0)
MCH: 30.4 pg (ref 26.0–34.0)
MCHC: 34.6 g/dL (ref 30.0–36.0)
MCV: 87.9 fL (ref 80.0–100.0)
Platelets: 133 10*3/uL — ABNORMAL LOW (ref 150–400)
RBC: 4.31 MIL/uL (ref 4.22–5.81)
RDW: 13.7 % (ref 11.5–15.5)
WBC: 6.8 10*3/uL (ref 4.0–10.5)
nRBC: 0 % (ref 0.0–0.2)

## 2019-11-30 MED ORDER — OXYCODONE HCL 5 MG PO TABS
5.0000 mg | ORAL_TABLET | ORAL | Status: DC | PRN
  Administered 2019-11-30 – 2019-12-01 (×4): 5 mg via ORAL
  Administered 2019-12-01 – 2019-12-03 (×6): 10 mg via ORAL
  Administered 2019-12-04 (×2): 5 mg via ORAL
  Administered 2019-12-05 – 2019-12-07 (×6): 10 mg via ORAL
  Administered 2019-12-08 – 2019-12-11 (×3): 5 mg via ORAL
  Filled 2019-11-30: qty 1
  Filled 2019-11-30 (×2): qty 2
  Filled 2019-11-30 (×2): qty 1
  Filled 2019-11-30 (×3): qty 2
  Filled 2019-11-30: qty 1
  Filled 2019-11-30: qty 2
  Filled 2019-11-30 (×2): qty 1
  Filled 2019-11-30 (×2): qty 2
  Filled 2019-11-30: qty 1
  Filled 2019-11-30 (×2): qty 2
  Filled 2019-11-30: qty 1
  Filled 2019-11-30 (×2): qty 2
  Filled 2019-11-30: qty 1
  Filled 2019-11-30 (×2): qty 2
  Filled 2019-11-30: qty 1

## 2019-11-30 MED ORDER — METHOCARBAMOL 500 MG PO TABS
1000.0000 mg | ORAL_TABLET | Freq: Three times a day (TID) | ORAL | Status: DC
  Administered 2019-11-30 – 2019-12-14 (×43): 1000 mg via ORAL
  Filled 2019-11-30 (×43): qty 2

## 2019-11-30 MED ORDER — CHLORHEXIDINE GLUCONATE CLOTH 2 % EX PADS
6.0000 | MEDICATED_PAD | Freq: Every day | CUTANEOUS | Status: DC
  Administered 2019-11-30 – 2019-12-11 (×8): 6 via TOPICAL

## 2019-11-30 MED ORDER — HYDROMORPHONE HCL 1 MG/ML IJ SOLN: 0.5000 mg | Freq: Four times a day (QID) | INTRAMUSCULAR | Status: DC | PRN

## 2019-11-30 MED ORDER — DOCUSATE SODIUM 100 MG PO CAPS
100.0000 mg | ORAL_CAPSULE | Freq: Two times a day (BID) | ORAL | Status: DC
  Administered 2019-12-01 – 2019-12-14 (×22): 100 mg via ORAL
  Filled 2019-11-30 (×24): qty 1

## 2019-11-30 MED ORDER — LEVETIRACETAM IN NACL 500 MG/100ML IV SOLN
500.0000 mg | Freq: Two times a day (BID) | INTRAVENOUS | Status: DC
  Administered 2019-11-30 – 2019-12-01 (×2): 500 mg via INTRAVENOUS
  Filled 2019-11-30 (×2): qty 100

## 2019-11-30 NOTE — Progress Notes (Signed)
Patients family dropped off a bag containing patient belongings. Belongings include: Shirt  1 x Pair of pants 1 x Pair of sneakers 1 x pair of underwear 1 x pair of socks  1 phone charger  1 sweater

## 2019-11-30 NOTE — Progress Notes (Signed)
Patient's pants, shoes, and socks given to wife and daughter with patient's permission.  Candy Sledge, RN
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
## 2019-12-01 LAB — CBC
HCT: 37.7 % — ABNORMAL LOW (ref 39.0–52.0)
Hemoglobin: 13 g/dL (ref 13.0–17.0)
MCH: 30.4 pg (ref 26.0–34.0)
MCHC: 34.5 g/dL (ref 30.0–36.0)
MCV: 88.3 fL (ref 80.0–100.0)
Platelets: 118 10*3/uL — ABNORMAL LOW (ref 150–400)
RBC: 4.27 MIL/uL (ref 4.22–5.81)
RDW: 13.5 % (ref 11.5–15.5)
WBC: 10.2 10*3/uL (ref 4.0–10.5)
nRBC: 0 % (ref 0.0–0.2)

## 2019-12-01 LAB — BASIC METABOLIC PANEL
Anion gap: 11 (ref 5–15)
BUN: 10 mg/dL (ref 6–20)
CO2: 21 mmol/L — ABNORMAL LOW (ref 22–32)
Calcium: 8.1 mg/dL — ABNORMAL LOW (ref 8.9–10.3)
Chloride: 101 mmol/L (ref 98–111)
Creatinine, Ser: 0.73 mg/dL (ref 0.61–1.24)
GFR calc Af Amer: >60 mL/min (ref >60)
GFR calc non Af Amer: >60 mL/min (ref >60)
Glucose, Bld: 120 mg/dL — ABNORMAL HIGH (ref 70–99)
Potassium: 3.5 mmol/L (ref 3.5–5.1)
Sodium: 133 mmol/L — ABNORMAL LOW (ref 135–145)

## 2019-12-01 LAB — PHOSPHORUS: Phosphorus: 2.8 mg/dL (ref 2.5–4.6)

## 2019-12-01 LAB — MAGNESIUM: Magnesium: 1.9 mg/dL (ref 1.7–2.4)

## 2019-12-01 MED ORDER — LEVETIRACETAM 500 MG PO TABS
500.0000 mg | ORAL_TABLET | Freq: Two times a day (BID) | ORAL | Status: AC
  Administered 2019-12-01 – 2019-12-06 (×11): 500 mg via ORAL
  Filled 2019-12-01 (×12): qty 1

## 2019-12-01 MED ORDER — ENOXAPARIN SODIUM 40 MG/0.4ML ~~LOC~~ SOLN
40.0000 mg | SUBCUTANEOUS | Status: DC
  Administered 2019-12-01 – 2019-12-08 (×8): 40 mg via SUBCUTANEOUS
  Filled 2019-12-01 (×8): qty 0.4
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
## 2019-12-02 LAB — BASIC METABOLIC PANEL
Anion gap: 7 (ref 5–15)
BUN: 13 mg/dL (ref 6–20)
CO2: 23 mmol/L (ref 22–32)
Calcium: 8 mg/dL — ABNORMAL LOW (ref 8.9–10.3)
Chloride: 101 mmol/L (ref 98–111)
Creatinine, Ser: 0.91 mg/dL (ref 0.61–1.24)
GFR calc Af Amer: >60 mL/min (ref >60)
GFR calc non Af Amer: >60 mL/min (ref >60)
Glucose, Bld: 113 mg/dL — ABNORMAL HIGH (ref 70–99)
Potassium: 3.4 mmol/L — ABNORMAL LOW (ref 3.5–5.1)
Sodium: 131 mmol/L — ABNORMAL LOW (ref 135–145)

## 2019-12-02 LAB — MAGNESIUM: Magnesium: 2.2 mg/dL (ref 1.7–2.4)

## 2019-12-02 LAB — CBC
HCT: 35.9 % — ABNORMAL LOW (ref 39.0–52.0)
Hemoglobin: 12.4 g/dL — ABNORMAL LOW (ref 13.0–17.0)
MCH: 30.6 pg (ref 26.0–34.0)
MCHC: 34.5 g/dL (ref 30.0–36.0)
MCV: 88.6 fL (ref 80.0–100.0)
Platelets: 120 10*3/uL — ABNORMAL LOW (ref 150–400)
RBC: 4.05 MIL/uL — ABNORMAL LOW (ref 4.22–5.81)
RDW: 13.5 % (ref 11.5–15.5)
WBC: 6.5 10*3/uL (ref 4.0–10.5)
nRBC: 0 % (ref 0.0–0.2)

## 2019-12-02 LAB — PHOSPHORUS: Phosphorus: 2.3 mg/dL — ABNORMAL LOW (ref 2.5–4.6)

## 2019-12-02 LAB — BLOOD PRODUCT ORDER (VERBAL) VERIFICATION
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
## 2019-12-03 ENCOUNTER — Inpatient Hospital Stay (HOSPITAL_COMMUNITY): Payer: No Typology Code available for payment source

## 2019-12-03 DIAGNOSIS — U071 COVID-19: Secondary | ICD-10-CM

## 2019-12-03 LAB — CBC
HCT: 35.8 % — ABNORMAL LOW (ref 39.0–52.0)
Hemoglobin: 12.3 g/dL — ABNORMAL LOW (ref 13.0–17.0)
MCH: 30.3 pg (ref 26.0–34.0)
MCHC: 34.4 g/dL (ref 30.0–36.0)
MCV: 88.2 fL (ref 80.0–100.0)
Platelets: 153 10*3/uL (ref 150–400)
RBC: 4.06 MIL/uL — ABNORMAL LOW (ref 4.22–5.81)
RDW: 13.5 % (ref 11.5–15.5)
WBC: 6.1 10*3/uL (ref 4.0–10.5)
nRBC: 0 % (ref 0.0–0.2)

## 2019-12-03 LAB — BPAM RBC
Blood Product Expiration Date: 202101192359
Blood Product Expiration Date: 202101192359
Blood Product Expiration Date: 202101202359
Blood Product Expiration Date: 202101262359
ISSUE DATE / TIME: 202012261150
ISSUE DATE / TIME: 202012261150
ISSUE DATE / TIME: 202012261159
ISSUE DATE / TIME: 202012261159
Unit Type and Rh: 5100
Unit Type and Rh: 5100
Unit Type and Rh: 7300
Unit Type and Rh: 7300

## 2019-12-03 LAB — BASIC METABOLIC PANEL
Anion gap: 10 (ref 5–15)
BUN: 15 mg/dL (ref 6–20)
CO2: 21 mmol/L — ABNORMAL LOW (ref 22–32)
Calcium: 7.9 mg/dL — ABNORMAL LOW (ref 8.9–10.3)
Chloride: 99 mmol/L (ref 98–111)
Creatinine, Ser: 0.71 mg/dL (ref 0.61–1.24)
GFR calc Af Amer: >60 mL/min (ref >60)
GFR calc non Af Amer: >60 mL/min (ref >60)
Glucose, Bld: 115 mg/dL — ABNORMAL HIGH (ref 70–99)
Potassium: 3.5 mmol/L (ref 3.5–5.1)
Sodium: 130 mmol/L — ABNORMAL LOW (ref 135–145)

## 2019-12-03 LAB — TYPE AND SCREEN
ABO/RH(D): B POS
Antibody Screen: NEGATIVE
Unit division: 0
Unit division: 0
Unit division: 0
Unit division: 0

## 2019-12-03 LAB — MAGNESIUM: Magnesium: 2.2 mg/dL (ref 1.7–2.4)

## 2019-12-03 LAB — PHOSPHORUS: Phosphorus: 2.8 mg/dL (ref 2.5–4.6)

## 2019-12-03 MED ORDER — HYDROMORPHONE HCL 1 MG/ML IJ SOLN: 0.5000 mg | Freq: Four times a day (QID) | INTRAMUSCULAR | Status: DC | PRN

## 2019-12-03 MED ORDER — ALBUTEROL SULFATE HFA 108 (90 BASE) MCG/ACT IN AERS
1.0000 | INHALATION_SPRAY | RESPIRATORY_TRACT | Status: DC | PRN
  Filled 2019-12-03: qty 6.7

## 2019-12-03 MED ORDER — POLYETHYLENE GLYCOL 3350 17 G PO PACK
17.0000 g | PACK | Freq: Every day | ORAL | Status: DC
  Administered 2019-12-06 – 2019-12-09 (×3): 17 g via ORAL
  Filled 2019-12-03 (×5): qty 1

## 2019-12-03 MED ORDER — DEXAMETHASONE 6 MG PO TABS
6.0000 mg | ORAL_TABLET | Freq: Every day | ORAL | Status: AC
  Administered 2019-12-03 – 2019-12-12 (×10): 6 mg via ORAL
  Filled 2019-12-03 (×10): qty 1
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
## 2019-12-03 NOTE — Progress Notes (Signed)
Patients with COVID-19 should quarantine for at least 14 days after initial diagnosis.  There is no point in getting retested within a 31-month period after initial the initial diagnosis.  Symptoms of the virus include fever, chills, shortness of breath, fatigue, body aches, headache, new loss of taste/ smell, sore throat, congestion/runny nose, nausea, vomiting, and/or diarrhea.  Use acetaminophen to treat fever symptoms.  Consider prescribing patient a albuterol inhaler to use as needed and dexamethasone 6 mg p.o. daily to take for a total of 10 days.  If able patient should obtain a pulse oximetry meter to check oxygen saturations at home and continue incentive spirometry.  If he can talk to his primary care provider in regards to monoclonal antibody infusion in the outpatient setting if COVID-19 disease is thought to likely progress. Emergency warning signs which patient should get immediate medical care include trouble breathing, persistent pain or pressure in the chest, new confusion, inability to wake or stay awake, bluish lips/ face, or O2 saturations less than 90%.

## 2019-12-03 NOTE — Progress Notes (Signed)
Received report from 4N nurse.  

## 2019-12-03 NOTE — Progress Notes (Signed)
Received call from [facility], [place] saying per Neurosurgery Pt does not need to wear C-collar.
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
## 2019-12-03 NOTE — Discharge Instructions (Addendum)
Respecto a COVID: -Necesitas poner en cuarentena en casa hasta el 12/13/2019 -Evite los "NSAIDS" (ie Ibuprofen, Advil, Naproxen, Motrin...) -Regrese al hospital si empeoran los signos de dificultad para respirar -Puede comprar un oxmetro de pulso en la farmacia, si las saturaciones de oxgeno caen por debajo del 90%, debe regresar al hospital. -Recomendar una visita telefnica con su mdico de atencin primaria aproximadamente 2 semanas despus del alta -Tome decadron segn lo prescrito   Fractura de costillas Rib Fracture  Una fractura de costilla es la ruptura o fisura de uno de los huesos que la forman. Las costillas son Otila Kluvercomo una jaula que rodea la parte superior del pecho. La fractura o fisura de una costilla puede ser dolorosa pero no causa otros problemas. La mayora de las fracturas de costillas se curan por s solas en un plazo de 1 a 3 meses. Siga estas indicaciones en su casa: Control del dolor, la rigidez y la hinchazn  Si se lo indican, aplique hielo sobre la zona lesionada. ? Ponga el hielo en una bolsa plstica. ? Coloque una FirstEnergy Corptoalla entre la piel y la bolsa de hielo. ? Coloque el hielo durante 20minutos, 2 o 3veces por da.  Tome los medicamentos de venta libre y los recetados solamente como se lo haya indicado el mdico. Actividad  Evite las actividades que puedan causar dolor en la zona lesionada. Proteja la zona lesionada.  Aumente lentamente la actividad segn las indicaciones del mdico. Instrucciones generales  Haga ejercicios de respiracin profunda como se lo haya indicado el mdico. Es posible que le indiquen lo siguiente: ? Sports administratorHaga respiraciones profundas varias veces al Futures traderda. ? Tosa varias veces al da mientras abraza una almohada. ? Use un dispositivo (espirmetro de incentivo) para Education officer, environmentalrealizar respiraciones profundas varias veces al da.  Beba suficiente lquido para mantener el pis (orina) claro o de color amarillo plido.  No use cinturones ni sujetadores.  No dejan que respire profundamente.  Concurra a todas las visitas de control como se lo haya indicado el mdico. Esto es importante. Comunquese con un mdico si:  Tiene fiebre. Solicite ayuda de inmediato si:  Tiene dificultad para respirar.  Le falta el aire.  No puede dejar de toser.  Tose y despide saliva espesa o con sangre (esputo).  Tiene Programme researcher, broadcasting/film/videomalestar estomacal (nuseas), vomita o tiene dolor de panza (abdominal).  El dolor empeora y los medicamentos no Contractorhacen efecto. Resumen  Burkina Fasona fractura de costilla es la ruptura o fisura de uno de los huesos que la forman.  Aplique hielo sobre la zona lesionada y tome los medicamentos para Primary school teachercalmar el dolor como se lo haya indicado el mdico.  Haga respiraciones profundas y tosa varias veces al da. Abrace una almohada cada vez que tosa. Esta informacin no tiene Theme park managercomo fin reemplazar el consejo del mdico. Asegrese de hacerle al mdico cualquier pregunta que tenga. Document Released: 07/23/2013 Document Revised: 08/02/2017 Document Reviewed: 08/02/2017 Elsevier Patient Education  2020 Elsevier Inc.    Lesin esplnica Splenic Injury  La lesin esplnica se produce en el bazo. El bazo es un rgano que est ubicado en rea superior izquierda del abdomen, justo debajo de las Broad[place] Parkcostillas. Se encarga de filtrar y Praxairlimpiar la sangre. Adems, almacena clulas sanguneas y destruye las clulas desgastadas. El bazo tambin cumple un papel importante en la lucha contra las enfermedades. Las lesiones esplnicas pueden variar. En algunos casos, el bazo puede tener solamente moretones, con una hemorragia menor dentro de la membrana que lo recubre y alrededor de l. Las lesiones  esplnicas tambin pueden causar una laceracin o un corte profundo en el bazo (desgarro de bazo). Algunas lesiones esplnicas tambin pueden hacer que el bazo se abra (rotura). Cules son las causas? Esta afeccin puede ser causada por un golpe directo (traumatismo) en el bazo. El  traumatismo puede ser el resultado de:  Accidentes automovilsticos.  Deportes de contacto.  Cadas.  Lesiones penetrantes. Estas pueden estar causadas por heridas de bala u objetos afilados como un cuchillo. Qu incrementa el riesgo? Puede correr ms riesgo de sufrir una lesin esplnica si tiene una enfermedad que pueda causar el agrandamiento del bazo. Estas incluyen:  Enfermedad heptica por alcoholismo.  Infecciones virales, especialmente mononucleosis.  Determinadas enfermedades inflamatorias, como el lupus.  Ciertos tipos de cncer, especialmente aquellos que involucran al sistema linftico.  Fibrosis qustica. Cules son los signos o los sntomas? Los sntomas de esta afeccin dependen de la gravedad de la lesin. A menudo, una lesin menor no causa sntomas o solo causa un dolor abdominal leve. Una lesin importante puede ocasionar hemorragias graves, lo que causa que la presin arterial disminuya rpidamente. Esto a su vez causar sntomas como:  Mareos o sensacin de desvanecimiento.  Frecuencia cardaca acelerada.  Dificultad para respirar.  Desmayos.  Sudoracin con piel hmeda y fra. Otros sntomas de una lesin esplnica pueden incluir:  Dolor abdominal muy intenso.  Dolor en el hombro izquierdo.  Dolor al presionar el abdomen (dolor a la palpacin).  Nuseas.  Hinchazn o hematomas en el abdomen. Cmo se diagnostica? Esta afeccin se puede diagnosticar en funcin de lo siguiente:  Sus sntomas y antecedentes mdicos, especialmente si se accident o lastim recientemente.  Un examen fsico.  Estudios de diagnstico por imgenes, por ejemplo: ? Exploracin por tomografa computarizada (TC). ? Ecografa. Es posible que le hagan anlisis de sangre con frecuencia unos das despus de la lesin, para supervisarla. Cmo se trata? El tratamiento depender del tipo y la gravedad de la lesin esplnica.  Las lesiones menos graves pueden tratarse  con lo siguiente: ? Observacin. ? Radiologa intervencionista. Esta se caracteriza por el uso de tubos flexibles (catteres) para detener la hemorragia desde el interior del vaso sanguneo.  Las lesiones ms graves pueden requerir la hospitalizacin en la unidad de cuidados intensivos [place]). Mientras est en el hospital: ? Le supervisarn de [employer] de lquidos y los valores sanguneos. ? Recibir lquidos por va intravenosa segn sea necesario. ? Es posible que necesite exmenes de control para verificar si la lesin en el bazo puede curarse por s sola. Si la lesin empeora, puede necesitar [place]. ? Puede recibir [employer] (transfusin). ? Pueden introducirle una aguja prolongada en el abdomen para extraer la sangre que se haya juntado dentro del bazo (hematoma).  Si la presin arterial est muy baja, puede necesitar una ciruga de emergencia. Esto puede incluir lo siguiente: ? Reparacin de Patent examiner. ? Extraccin de parte del bazo. ? Extraccin de todo el bazo (esplenectoma). Siga estas indicaciones en su casa: Actividad  Haga reposo como se lo haya indicado el mdico.  Evite estar sentado durante largos perodos sin moverse. Levntese y camine un poco cada 1 a 2 horas. Esto es importante para mejorar el flujo sanguneo y la respiracin. Pida ayuda si se siente dbil o inestable.  No participe en ninguna actividad que requiera mucho esfuerzo hasta que el mdico le diga que es seguro [place].  No participe en deportes de contacto hasta que su mdico le diga que es seguro [place].  No levante ningn objeto que pese ms de 10libras (4,5kg) o el lmite de peso que le hayan indicado, hasta que el mdico le diga que puede [place]. Instrucciones generales  [employer]phi de venta libre y los recetados solamente como se lo haya indicado el mdico.  Est al da con las vacunas segn se lo haya indicado el mdico.  Siga las indicaciones del mdico  respecto de las restricciones de comidas o bebidas.  No beba alcohol.  No consuma ningn producto que contenga nicotina o tabaco, como cigarrillos y Psychologist, sport and exercise. Estos pueden retrasar la cicatrizacin despus de una lesin. Si necesita ayuda para dejar de fumar, consulte al mdico.  Concurra a todas las visitas de control como se lo haya indicado el mdico. Esto es importante. Solicite ayuda inmediatamente si tiene:  Systems analyst.  Dolor nuevo en el abdomen o el hombro izquierdo, o el dolor [place].  Signos o sntomas de hemorragia interna. Est atento a lo siguiente: ? Sudoracin. ? Mareos. ? Debilidad. ? Piel fra y hmeda. ? Desmayos.  Dolor en el pecho o dificultad para respirar. Resumen  El bazo es un rgano que est ubicado en rea superior izquierda del abdomen, justo debajo de las [place].  La lesin esplnica se produce en el bazo. Esta puede ser causada por un golpe directo (traumatismo) en el bazo.  Puede correr ms riesgo de sufrir una lesin esplnica si tiene una enfermedad que pueda causar el agrandamiento del bazo.  A menudo, una lesin menor no causa sntomas o solo causa un dolor abdominal leve. Una lesin importante puede ocasionar hemorragias graves, lo que causa que la presin arterial disminuya rpidamente.  El tratamiento depender del tipo y la gravedad de la lesin esplnica. Esta informacin no tiene Marine scientist el consejo del mdico. Asegrese de hacerle al mdico cualquier pregunta que tenga. Document Released: 08/02/2011 Document Revised: 02/01/2018 Document Reviewed: 02/01/2018 Elsevier Patient Education  2020 [employer].

## 2019-12-03 NOTE — Progress Notes (Signed)
Pt arrived to [place] from [facility]. Pt lives at home with wife. Pt placed on progressive monitor. Pt oriented to room. Pt is A&Ox2. Pt has right groin dressing to that is DD&I. 4N nurse sent pt's wallet up. Pt stated to place wallet in his pt's belongings bag. Wallet placed in bag in pt's closet. Will continue to monitor pt. Ranelle Oyster, RN
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
## 2019-12-04 NOTE — Progress Notes (Signed)
PT Cancellation Note  Patient Details Name: John Byrd MRN: [id number] DOB: 16-May-1961   Cancelled Treatment:    Reason Eval/Treat Not Completed: Other (comment). Pt just amb back to bed from bathroom with nursing. Ready for home with family from PT standpoint.   Shary Decamp [place] 12/04/2019, 11:14 AM [place] Pager [phone] Office [phone]
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
## 2019-12-05 ENCOUNTER — Inpatient Hospital Stay (HOSPITAL_COMMUNITY): Payer: No Typology Code available for payment source

## 2019-12-05 DIAGNOSIS — I10 Essential (primary) hypertension: Secondary | ICD-10-CM | POA: Diagnosis present

## 2019-12-05 DIAGNOSIS — R7303 Prediabetes: Secondary | ICD-10-CM | POA: Diagnosis present

## 2019-12-05 DIAGNOSIS — Z8616 Personal history of COVID-19: Secondary | ICD-10-CM | POA: Diagnosis present

## 2019-12-05 DIAGNOSIS — Z952 Presence of prosthetic heart valve: Secondary | ICD-10-CM

## 2019-12-05 LAB — COMPREHENSIVE METABOLIC PANEL
ALT: 83 U/L — ABNORMAL HIGH (ref 0–44)
AST: 148 U/L — ABNORMAL HIGH (ref 15–41)
Albumin: 2.9 g/dL — ABNORMAL LOW (ref 3.5–5.0)
Alkaline Phosphatase: 113 U/L (ref 38–126)
Anion gap: 11 (ref 5–15)
BUN: 17 mg/dL (ref 6–20)
CO2: 21 mmol/L — ABNORMAL LOW (ref 22–32)
Calcium: 8.2 mg/dL — ABNORMAL LOW (ref 8.9–10.3)
Chloride: 101 mmol/L (ref 98–111)
Creatinine, Ser: 0.96 mg/dL (ref 0.61–1.24)
GFR calc Af Amer: >60 mL/min (ref >60)
GFR calc non Af Amer: >60 mL/min (ref >60)
Glucose, Bld: 113 mg/dL — ABNORMAL HIGH (ref 70–99)
Potassium: 3.5 mmol/L (ref 3.5–5.1)
Sodium: 133 mmol/L — ABNORMAL LOW (ref 135–145)
Total Bilirubin: 1.3 mg/dL — ABNORMAL HIGH (ref 0.3–1.2)
Total Protein: 6.8 g/dL (ref 6.5–8.1)

## 2019-12-05 LAB — CBC WITH DIFFERENTIAL/PLATELET
Abs Immature Granulocytes: 0.18 10*3/uL — ABNORMAL HIGH (ref 0.00–0.07)
Basophils Absolute: 0 10*3/uL (ref 0.0–0.1)
Basophils Relative: 0 %
Eosinophils Absolute: 0 10*3/uL (ref 0.0–0.5)
Eosinophils Relative: 0 %
HCT: 35.5 % — ABNORMAL LOW (ref 39.0–52.0)
Hemoglobin: 12.1 g/dL — ABNORMAL LOW (ref 13.0–17.0)
Immature Granulocytes: 2 %
Lymphocytes Relative: 10 %
Lymphs Abs: 0.9 10*3/uL (ref 0.7–4.0)
MCH: 30 pg (ref 26.0–34.0)
MCHC: 34.1 g/dL (ref 30.0–36.0)
MCV: 88.1 fL (ref 80.0–100.0)
Monocytes Absolute: 0.4 10*3/uL (ref 0.1–1.0)
Monocytes Relative: 5 %
Neutro Abs: 7.5 10*3/uL (ref 1.7–7.7)
Neutrophils Relative %: 83 %
Platelets: 241 10*3/uL (ref 150–400)
RBC: 4.03 MIL/uL — ABNORMAL LOW (ref 4.22–5.81)
RDW: 13.3 % (ref 11.5–15.5)
WBC: 9 10*3/uL (ref 4.0–10.5)
nRBC: 0 % (ref 0.0–0.2)

## 2019-12-05 LAB — FIBRINOGEN: Fibrinogen: 704 mg/dL — ABNORMAL HIGH (ref 210–475)

## 2019-12-05 LAB — D-DIMER, QUANTITATIVE
D-Dimer, Quant: 8.81 ug/mL-FEU — ABNORMAL HIGH (ref 0.00–0.50)
D-Dimer, Quant: 7.05 ug/mL-FEU — ABNORMAL HIGH (ref 0.00–0.50)

## 2019-12-05 LAB — LACTATE DEHYDROGENASE: LDH: 1140 U/L — ABNORMAL HIGH (ref 98–192)

## 2019-12-05 LAB — PROCALCITONIN: Procalcitonin: 1.18 ng/mL

## 2019-12-05 LAB — FERRITIN: Ferritin: 1804 ng/mL — ABNORMAL HIGH (ref 24–336)

## 2019-12-05 LAB — C-REACTIVE PROTEIN
CRP: 11 mg/dL — ABNORMAL HIGH (ref <1.0)
CRP: 14.7 mg/dL — ABNORMAL HIGH (ref <1.0)

## 2019-12-05 MED ORDER — SODIUM CHLORIDE 0.9 % IV SOLN
100.0000 mg | Freq: Every day | INTRAVENOUS | Status: AC
  Administered 2019-12-06 – 2019-12-09 (×4): 100 mg via INTRAVENOUS
  Filled 2019-12-05 (×5): qty 20

## 2019-12-05 MED ORDER — ALBUTEROL SULFATE HFA 108 (90 BASE) MCG/ACT IN AERS
4.0000 | INHALATION_SPRAY | RESPIRATORY_TRACT | Status: DC | PRN
  Filled 2019-12-05: qty 6.7

## 2019-12-05 MED ORDER — SODIUM CHLORIDE 0.9 % IV SOLN
2.0000 g | INTRAVENOUS | Status: AC
  Administered 2019-12-05 – 2019-12-09 (×5): 2 g via INTRAVENOUS
  Filled 2019-12-05 (×5): qty 20

## 2019-12-05 MED ORDER — SODIUM CHLORIDE 0.9 % IV SOLN
200.0000 mg | Freq: Once | INTRAVENOUS | Status: AC
  Administered 2019-12-05: 200 mg via INTRAVENOUS
  Filled 2019-12-05: qty 40

## 2019-12-05 MED ORDER — SODIUM CHLORIDE 0.9 % IV SOLN
500.0000 mg | INTRAVENOUS | Status: AC
  Administered 2019-12-05 – 2019-12-09 (×5): 500 mg via INTRAVENOUS
  Filled 2019-12-05 (×5): qty 500

## 2019-12-05 NOTE — Plan of Care (Signed)
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
## 2019-12-06 LAB — COMPREHENSIVE METABOLIC PANEL
ALT: 119 U/L — ABNORMAL HIGH (ref 0–44)
AST: 188 U/L — ABNORMAL HIGH (ref 15–41)
Albumin: 2.7 g/dL — ABNORMAL LOW (ref 3.5–5.0)
Alkaline Phosphatase: 113 U/L (ref 38–126)
Anion gap: 12 (ref 5–15)
BUN: 16 mg/dL (ref 6–20)
CO2: 22 mmol/L (ref 22–32)
Calcium: 8.1 mg/dL — ABNORMAL LOW (ref 8.9–10.3)
Chloride: 98 mmol/L (ref 98–111)
Creatinine, Ser: 0.81 mg/dL (ref 0.61–1.24)
GFR calc Af Amer: >60 mL/min (ref >60)
GFR calc non Af Amer: >60 mL/min (ref >60)
Glucose, Bld: 103 mg/dL — ABNORMAL HIGH (ref 70–99)
Potassium: 3.7 mmol/L (ref 3.5–5.1)
Sodium: 132 mmol/L — ABNORMAL LOW (ref 135–145)
Total Bilirubin: 0.8 mg/dL (ref 0.3–1.2)
Total Protein: 6.5 g/dL (ref 6.5–8.1)

## 2019-12-06 LAB — CBC WITH DIFFERENTIAL/PLATELET
Abs Immature Granulocytes: 0.19 10*3/uL — ABNORMAL HIGH (ref 0.00–0.07)
Basophils Absolute: 0 10*3/uL (ref 0.0–0.1)
Basophils Relative: 0 %
Eosinophils Absolute: 0 10*3/uL (ref 0.0–0.5)
Eosinophils Relative: 0 %
HCT: 34.9 % — ABNORMAL LOW (ref 39.0–52.0)
Hemoglobin: 11.8 g/dL — ABNORMAL LOW (ref 13.0–17.0)
Immature Granulocytes: 2 %
Lymphocytes Relative: 14 %
Lymphs Abs: 1.2 10*3/uL (ref 0.7–4.0)
MCH: 30 pg (ref 26.0–34.0)
MCHC: 33.8 g/dL (ref 30.0–36.0)
MCV: 88.8 fL (ref 80.0–100.0)
Monocytes Absolute: 0.5 10*3/uL (ref 0.1–1.0)
Monocytes Relative: 6 %
Neutro Abs: 6.5 10*3/uL (ref 1.7–7.7)
Neutrophils Relative %: 78 %
Platelets: 236 10*3/uL (ref 150–400)
RBC: 3.93 MIL/uL — ABNORMAL LOW (ref 4.22–5.81)
RDW: 13.5 % (ref 11.5–15.5)
WBC: 8.4 10*3/uL (ref 4.0–10.5)
nRBC: 0 % (ref 0.0–0.2)

## 2019-12-06 LAB — D-DIMER, QUANTITATIVE
D-Dimer, Quant: 6.87 ug/mL-FEU — ABNORMAL HIGH (ref 0.00–0.50)
D-Dimer, Quant: 8.09 ug/mL-FEU — ABNORMAL HIGH (ref 0.00–0.50)

## 2019-12-06 LAB — PHOSPHORUS: Phosphorus: 2.8 mg/dL (ref 2.5–4.6)

## 2019-12-06 LAB — FERRITIN: Ferritin: 2552 ng/mL — ABNORMAL HIGH (ref 24–336)

## 2019-12-06 LAB — C-REACTIVE PROTEIN
CRP: 15.6 mg/dL — ABNORMAL HIGH (ref <1.0)
CRP: 13.9 mg/dL — ABNORMAL HIGH (ref <1.0)

## 2019-12-06 LAB — MAGNESIUM: Magnesium: 2.1 mg/dL (ref 1.7–2.4)

## 2019-12-06 MED ORDER — DM-GUAIFENESIN ER 30-600 MG PO TB12
1.0000 | ORAL_TABLET | Freq: Two times a day (BID) | ORAL | Status: DC
  Administered 2019-12-06 – 2019-12-14 (×17): 1 via ORAL
  Filled 2019-12-06 (×17): qty 1

## 2019-12-06 MED ORDER — PANTOPRAZOLE SODIUM 40 MG PO TBEC
40.0000 mg | DELAYED_RELEASE_TABLET | Freq: Every day | ORAL | Status: DC
  Administered 2019-12-06 – 2019-12-14 (×9): 40 mg via ORAL
  Filled 2019-12-06 (×9): qty 1

## 2019-12-06 MED ORDER — ALBUTEROL SULFATE HFA 108 (90 BASE) MCG/ACT IN AERS
2.0000 | INHALATION_SPRAY | Freq: Four times a day (QID) | RESPIRATORY_TRACT | Status: DC
  Administered 2019-12-06 – 2019-12-10 (×17): 2 via RESPIRATORY_TRACT
  Filled 2019-12-06: qty 6.7
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
## 2019-12-06 NOTE — Plan of Care (Signed)

## 2019-12-07 ENCOUNTER — Inpatient Hospital Stay (HOSPITAL_COMMUNITY): Payer: No Typology Code available for payment source

## 2019-12-07 LAB — COMPREHENSIVE METABOLIC PANEL
ALT: 122 U/L — ABNORMAL HIGH (ref 0–44)
AST: 105 U/L — ABNORMAL HIGH (ref 15–41)
Albumin: 2.7 g/dL — ABNORMAL LOW (ref 3.5–5.0)
Alkaline Phosphatase: 105 U/L (ref 38–126)
Anion gap: 9 (ref 5–15)
BUN: 15 mg/dL (ref 6–20)
CO2: 20 mmol/L — ABNORMAL LOW (ref 22–32)
Calcium: 8 mg/dL — ABNORMAL LOW (ref 8.9–10.3)
Chloride: 104 mmol/L (ref 98–111)
Creatinine, Ser: 0.82 mg/dL (ref 0.61–1.24)
GFR calc Af Amer: >60 mL/min (ref >60)
GFR calc non Af Amer: >60 mL/min (ref >60)
Glucose, Bld: 104 mg/dL — ABNORMAL HIGH (ref 70–99)
Potassium: 3.7 mmol/L (ref 3.5–5.1)
Sodium: 133 mmol/L — ABNORMAL LOW (ref 135–145)
Total Bilirubin: 1.1 mg/dL (ref 0.3–1.2)
Total Protein: 6.5 g/dL (ref 6.5–8.1)

## 2019-12-07 LAB — CBC WITH DIFFERENTIAL/PLATELET
Abs Immature Granulocytes: 0 10*3/uL (ref 0.00–0.07)
Band Neutrophils: 4 %
Basophils Absolute: 0 10*3/uL (ref 0.0–0.1)
Basophils Relative: 0 %
Eosinophils Absolute: 0.1 10*3/uL (ref 0.0–0.5)
Eosinophils Relative: 1 %
HCT: 34.9 % — ABNORMAL LOW (ref 39.0–52.0)
Hemoglobin: 11.9 g/dL — ABNORMAL LOW (ref 13.0–17.0)
Lymphocytes Relative: 11 %
Lymphs Abs: 0.9 10*3/uL (ref 0.7–4.0)
MCH: 30.3 pg (ref 26.0–34.0)
MCHC: 34.1 g/dL (ref 30.0–36.0)
MCV: 88.8 fL (ref 80.0–100.0)
Monocytes Absolute: 0.2 10*3/uL (ref 0.1–1.0)
Monocytes Relative: 2 %
Neutro Abs: 6.8 10*3/uL (ref 1.7–7.7)
Neutrophils Relative %: 82 %
Platelets: 309 10*3/uL (ref 150–400)
RBC: 3.93 MIL/uL — ABNORMAL LOW (ref 4.22–5.81)
RDW: 13.4 % (ref 11.5–15.5)
WBC: 7.9 10*3/uL (ref 4.0–10.5)
nRBC: 0 % (ref 0.0–0.2)
nRBC: 0 /100 WBC

## 2019-12-07 LAB — D-DIMER, QUANTITATIVE
D-Dimer, Quant: 7.53 ug/mL-FEU — ABNORMAL HIGH (ref 0.00–0.50)
D-Dimer, Quant: 7.19 ug/mL-FEU — ABNORMAL HIGH (ref 0.00–0.50)

## 2019-12-07 LAB — MAGNESIUM: Magnesium: 2.1 mg/dL (ref 1.7–2.4)

## 2019-12-07 LAB — C-REACTIVE PROTEIN
CRP: 10.1 mg/dL — ABNORMAL HIGH (ref <1.0)
CRP: 8.6 mg/dL — ABNORMAL HIGH (ref <1.0)

## 2019-12-07 LAB — PHOSPHORUS: Phosphorus: 2.7 mg/dL (ref 2.5–4.6)

## 2019-12-07 LAB — HEMOGLOBIN A1C
Hgb A1c MFr Bld: 5.9 % — ABNORMAL HIGH (ref 4.8–5.6)
Mean Plasma Glucose: 122.63 mg/dL

## 2019-12-07 LAB — FERRITIN: Ferritin: 2554 ng/mL — ABNORMAL HIGH (ref 24–336)

## 2019-12-07 MED ORDER — TRAZODONE HCL 50 MG PO TABS
50.0000 mg | ORAL_TABLET | Freq: Once | ORAL | Status: AC
  Administered 2019-12-07: 50 mg via ORAL
  Filled 2019-12-07: qty 1
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
## 2019-12-07 NOTE — Plan of Care (Signed)

## 2019-12-08 LAB — CBC WITH DIFFERENTIAL/PLATELET
Abs Immature Granulocytes: 0.83 10*3/uL — ABNORMAL HIGH (ref 0.00–0.07)
Basophils Absolute: 0.1 10*3/uL (ref 0.0–0.1)
Basophils Relative: 1 %
Eosinophils Absolute: 0 10*3/uL (ref 0.0–0.5)
Eosinophils Relative: 0 %
HCT: 32.9 % — ABNORMAL LOW (ref 39.0–52.0)
Hemoglobin: 11.2 g/dL — ABNORMAL LOW (ref 13.0–17.0)
Immature Granulocytes: 9 %
Lymphocytes Relative: 17 %
Lymphs Abs: 1.7 10*3/uL (ref 0.7–4.0)
MCH: 29.6 pg (ref 26.0–34.0)
MCHC: 34 g/dL (ref 30.0–36.0)
MCV: 86.8 fL (ref 80.0–100.0)
Monocytes Absolute: 0.7 10*3/uL (ref 0.1–1.0)
Monocytes Relative: 7 %
Neutro Abs: 6.4 10*3/uL (ref 1.7–7.7)
Neutrophils Relative %: 66 %
Platelets: 353 10*3/uL (ref 150–400)
RBC: 3.79 MIL/uL — ABNORMAL LOW (ref 4.22–5.81)
RDW: 13.4 % (ref 11.5–15.5)
WBC: 9.7 10*3/uL (ref 4.0–10.5)
nRBC: 0 % (ref 0.0–0.2)

## 2019-12-08 LAB — COMPREHENSIVE METABOLIC PANEL
ALT: 110 U/L — ABNORMAL HIGH (ref 0–44)
AST: 72 U/L — ABNORMAL HIGH (ref 15–41)
Albumin: 2.6 g/dL — ABNORMAL LOW (ref 3.5–5.0)
Alkaline Phosphatase: 97 U/L (ref 38–126)
Anion gap: 11 (ref 5–15)
BUN: 18 mg/dL (ref 6–20)
CO2: 18 mmol/L — ABNORMAL LOW (ref 22–32)
Calcium: 7.9 mg/dL — ABNORMAL LOW (ref 8.9–10.3)
Chloride: 105 mmol/L (ref 98–111)
Creatinine, Ser: 0.66 mg/dL (ref 0.61–1.24)
GFR calc Af Amer: >60 mL/min (ref >60)
GFR calc non Af Amer: >60 mL/min (ref >60)
Glucose, Bld: 108 mg/dL — ABNORMAL HIGH (ref 70–99)
Potassium: 3.5 mmol/L (ref 3.5–5.1)
Sodium: 134 mmol/L — ABNORMAL LOW (ref 135–145)
Total Bilirubin: 0.9 mg/dL (ref 0.3–1.2)
Total Protein: 6 g/dL — ABNORMAL LOW (ref 6.5–8.1)

## 2019-12-08 LAB — FERRITIN: Ferritin: 2448 ng/mL — ABNORMAL HIGH (ref 24–336)

## 2019-12-08 LAB — PHOSPHORUS: Phosphorus: 3.2 mg/dL (ref 2.5–4.6)

## 2019-12-08 LAB — MAGNESIUM: Magnesium: 2.1 mg/dL (ref 1.7–2.4)

## 2019-12-08 MED ORDER — ACETAMINOPHEN 500 MG PO TABS
1000.0000 mg | ORAL_TABLET | Freq: Four times a day (QID) | ORAL | Status: DC | PRN
  Administered 2019-12-10 – 2019-12-13 (×3): 1000 mg via ORAL
  Filled 2019-12-08 (×4): qty 2

## 2019-12-08 MED ORDER — ENOXAPARIN SODIUM 40 MG/0.4ML ~~LOC~~ SOLN
40.0000 mg | SUBCUTANEOUS | Status: DC
  Administered 2019-12-08 – 2019-12-09 (×2): 40 mg via SUBCUTANEOUS
  Filled 2019-12-08 (×2): qty 0.4
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
## 2019-12-08 NOTE — Progress Notes (Signed)
Patient desatted to the 70s while ambulating with therapy. Patient placed back in chair on 6L [place] without any improvement. Patient currently on 10L HFNC and has saturations of 92%. Patient educated about deep breathing exercises and continues to work with incentive spirometer/flutter valve. Will continue to monitor and wean oxygen when able.    Lyndal Pulley, RN 12/08/2019 11:00 AM
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
## 2019-12-09 ENCOUNTER — Inpatient Hospital Stay (HOSPITAL_COMMUNITY): Payer: No Typology Code available for payment source

## 2019-12-09 LAB — COMPREHENSIVE METABOLIC PANEL
ALT: 126 U/L — ABNORMAL HIGH (ref 0–44)
AST: 88 U/L — ABNORMAL HIGH (ref 15–41)
Albumin: 2.7 g/dL — ABNORMAL LOW (ref 3.5–5.0)
Alkaline Phosphatase: 112 U/L (ref 38–126)
Anion gap: 10 (ref 5–15)
BUN: 16 mg/dL (ref 6–20)
CO2: 20 mmol/L — ABNORMAL LOW (ref 22–32)
Calcium: 8.1 mg/dL — ABNORMAL LOW (ref 8.9–10.3)
Chloride: 103 mmol/L (ref 98–111)
Creatinine, Ser: 0.69 mg/dL (ref 0.61–1.24)
GFR calc Af Amer: >60 mL/min (ref >60)
GFR calc non Af Amer: >60 mL/min (ref >60)
Glucose, Bld: 100 mg/dL — ABNORMAL HIGH (ref 70–99)
Potassium: 3.7 mmol/L (ref 3.5–5.1)
Sodium: 133 mmol/L — ABNORMAL LOW (ref 135–145)
Total Bilirubin: 0.9 mg/dL (ref 0.3–1.2)
Total Protein: 6.6 g/dL (ref 6.5–8.1)

## 2019-12-09 LAB — CBC WITH DIFFERENTIAL/PLATELET
Abs Immature Granulocytes: 1.16 10*3/uL — ABNORMAL HIGH (ref 0.00–0.07)
Basophils Absolute: 0 10*3/uL (ref 0.0–0.1)
Basophils Relative: 0 %
Eosinophils Absolute: 0 10*3/uL (ref 0.0–0.5)
Eosinophils Relative: 0 %
HCT: 35.3 % — ABNORMAL LOW (ref 39.0–52.0)
Hemoglobin: 12.2 g/dL — ABNORMAL LOW (ref 13.0–17.0)
Immature Granulocytes: 9 %
Lymphocytes Relative: 16 %
Lymphs Abs: 2 10*3/uL (ref 0.7–4.0)
MCH: 30.7 pg (ref 26.0–34.0)
MCHC: 34.6 g/dL (ref 30.0–36.0)
MCV: 88.7 fL (ref 80.0–100.0)
Monocytes Absolute: 0.7 10*3/uL (ref 0.1–1.0)
Monocytes Relative: 6 %
Neutro Abs: 8.3 10*3/uL — ABNORMAL HIGH (ref 1.7–7.7)
Neutrophils Relative %: 69 %
Platelets: 407 10*3/uL — ABNORMAL HIGH (ref 150–400)
RBC: 3.98 MIL/uL — ABNORMAL LOW (ref 4.22–5.81)
RDW: 13.6 % (ref 11.5–15.5)
WBC: 12.3 10*3/uL — ABNORMAL HIGH (ref 4.0–10.5)
nRBC: 0.3 % — ABNORMAL HIGH (ref 0.0–0.2)

## 2019-12-09 LAB — MAGNESIUM: Magnesium: 2 mg/dL (ref 1.7–2.4)

## 2019-12-09 LAB — FERRITIN: Ferritin: 2475 ng/mL — ABNORMAL HIGH (ref 24–336)

## 2019-12-09 LAB — PHOSPHORUS: Phosphorus: 2.9 mg/dL (ref 2.5–4.6)

## 2019-12-09 MED ORDER — IOHEXOL 350 MG/ML SOLN
75.0000 mL | Freq: Once | INTRAVENOUS | Status: AC | PRN
  Administered 2019-12-09: 75 mL via INTRAVENOUS

## 2019-12-09 MED ORDER — POLYETHYLENE GLYCOL 3350 17 G PO PACK
17.0000 g | PACK | Freq: Two times a day (BID) | ORAL | Status: DC
  Administered 2019-12-09 – 2019-12-14 (×9): 17 g via ORAL
  Filled 2019-12-09 (×9): qty 1

## 2019-12-09 MED ORDER — POTASSIUM CHLORIDE CRYS ER 20 MEQ PO TBCR
20.0000 meq | EXTENDED_RELEASE_TABLET | Freq: Once | ORAL | Status: AC
  Administered 2019-12-09: 20 meq via ORAL
  Filled 2019-12-09: qty 1

## 2019-12-09 MED ORDER — FUROSEMIDE 10 MG/ML IJ SOLN
20.0000 mg | Freq: Once | INTRAMUSCULAR | Status: AC
  Administered 2019-12-09: 20 mg via INTRAVENOUS
  Filled 2019-12-09: qty 2
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
## 2019-12-10 ENCOUNTER — Inpatient Hospital Stay (HOSPITAL_COMMUNITY): Payer: No Typology Code available for payment source

## 2019-12-10 DIAGNOSIS — I10 Essential (primary) hypertension: Secondary | ICD-10-CM

## 2019-12-10 LAB — COMPREHENSIVE METABOLIC PANEL
ALT: 126 U/L — ABNORMAL HIGH (ref 0–44)
AST: 72 U/L — ABNORMAL HIGH (ref 15–41)
Albumin: 2.9 g/dL — ABNORMAL LOW (ref 3.5–5.0)
Alkaline Phosphatase: 125 U/L (ref 38–126)
Anion gap: 8 (ref 5–15)
BUN: 19 mg/dL (ref 6–20)
CO2: 20 mmol/L — ABNORMAL LOW (ref 22–32)
Calcium: 8.1 mg/dL — ABNORMAL LOW (ref 8.9–10.3)
Chloride: 104 mmol/L (ref 98–111)
Creatinine, Ser: 0.77 mg/dL (ref 0.61–1.24)
GFR calc Af Amer: >60 mL/min (ref >60)
GFR calc non Af Amer: >60 mL/min (ref >60)
Glucose, Bld: 96 mg/dL (ref 70–99)
Potassium: 4.5 mmol/L (ref 3.5–5.1)
Sodium: 132 mmol/L — ABNORMAL LOW (ref 135–145)
Total Bilirubin: 1.2 mg/dL (ref 0.3–1.2)
Total Protein: 6.9 g/dL (ref 6.5–8.1)

## 2019-12-10 LAB — CBC WITH DIFFERENTIAL/PLATELET
Abs Immature Granulocytes: 1.47 10*3/uL — ABNORMAL HIGH (ref 0.00–0.07)
Basophils Absolute: 0 10*3/uL (ref 0.0–0.1)
Basophils Relative: 0 %
Eosinophils Absolute: 0.1 10*3/uL (ref 0.0–0.5)
Eosinophils Relative: 1 %
HCT: 37.9 % — ABNORMAL LOW (ref 39.0–52.0)
Hemoglobin: 12.5 g/dL — ABNORMAL LOW (ref 13.0–17.0)
Immature Granulocytes: 11 %
Lymphocytes Relative: 15 %
Lymphs Abs: 2 10*3/uL (ref 0.7–4.0)
MCH: 30.3 pg (ref 26.0–34.0)
MCHC: 33 g/dL (ref 30.0–36.0)
MCV: 91.8 fL (ref 80.0–100.0)
Monocytes Absolute: 0.9 10*3/uL (ref 0.1–1.0)
Monocytes Relative: 7 %
Neutro Abs: 8.5 10*3/uL — ABNORMAL HIGH (ref 1.7–7.7)
Neutrophils Relative %: 66 %
Platelets: 341 10*3/uL (ref 150–400)
RBC: 4.13 MIL/uL — ABNORMAL LOW (ref 4.22–5.81)
RDW: 13.9 % (ref 11.5–15.5)
WBC: 13 10*3/uL — ABNORMAL HIGH (ref 4.0–10.5)
nRBC: 0.2 % (ref 0.0–0.2)

## 2019-12-10 LAB — MAGNESIUM: Magnesium: 2.3 mg/dL (ref 1.7–2.4)

## 2019-12-10 LAB — PHOSPHORUS: Phosphorus: 3.6 mg/dL (ref 2.5–4.6)

## 2019-12-10 LAB — FERRITIN: Ferritin: 2169 ng/mL — ABNORMAL HIGH (ref 24–336)

## 2019-12-10 MED ORDER — FUROSEMIDE 10 MG/ML IJ SOLN
20.0000 mg | Freq: Once | INTRAMUSCULAR | Status: AC
  Administered 2019-12-10: 20 mg via INTRAVENOUS
  Filled 2019-12-10: qty 2

## 2019-12-10 MED ORDER — ENOXAPARIN SODIUM 40 MG/0.4ML ~~LOC~~ SOLN
40.0000 mg | Freq: Two times a day (BID) | SUBCUTANEOUS | Status: DC
  Administered 2019-12-10 – 2019-12-14 (×8): 40 mg via SUBCUTANEOUS
  Filled 2019-12-10 (×9): qty 0.4

## 2019-12-10 MED ORDER — ALBUTEROL SULFATE HFA 108 (90 BASE) MCG/ACT IN AERS
2.0000 | INHALATION_SPRAY | Freq: Three times a day (TID) | RESPIRATORY_TRACT | Status: DC
  Administered 2019-12-11 – 2019-12-14 (×10): 2 via RESPIRATORY_TRACT

## 2019-12-10 NOTE — Progress Notes (Signed)
OT Cancellation Note  Patient Details Name: John Byrd MRN: [id number] DOB: [birth year]   Cancelled Treatment:    Reason Eval/Treat Not Completed: Patient at procedure or test/ unavailable Pt at test, not available for OT tx at this time. Will continue to follow as available and appropriate.  Dalphine Handing, MSOT, OTR/L Acute Rehabilitation Services [facility] Office Number: [phone]   Dalphine Handing 12/10/2019, 4:20 PM
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
## 2019-12-11 LAB — COMPREHENSIVE METABOLIC PANEL
ALT: 123 U/L — ABNORMAL HIGH (ref 0–44)
AST: 55 U/L — ABNORMAL HIGH (ref 15–41)
Albumin: 2.8 g/dL — ABNORMAL LOW (ref 3.5–5.0)
Alkaline Phosphatase: 133 U/L — ABNORMAL HIGH (ref 38–126)
Anion gap: 11 (ref 5–15)
BUN: 25 mg/dL — ABNORMAL HIGH (ref 6–20)
CO2: 20 mmol/L — ABNORMAL LOW (ref 22–32)
Calcium: 8.3 mg/dL — ABNORMAL LOW (ref 8.9–10.3)
Chloride: 103 mmol/L (ref 98–111)
Creatinine, Ser: 0.82 mg/dL (ref 0.61–1.24)
GFR calc Af Amer: >60 mL/min (ref >60)
GFR calc non Af Amer: >60 mL/min (ref >60)
Glucose, Bld: 115 mg/dL — ABNORMAL HIGH (ref 70–99)
Potassium: 4.1 mmol/L (ref 3.5–5.1)
Sodium: 134 mmol/L — ABNORMAL LOW (ref 135–145)
Total Bilirubin: 0.8 mg/dL (ref 0.3–1.2)
Total Protein: 6.7 g/dL (ref 6.5–8.1)
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
## 2019-12-11 NOTE — Progress Notes (Signed)
New x-rays reviews. Pelvis fracture sill present but still hasn't shifted. Continue TTWB for 6 weeks. Will follow-up with Dr. Dion Saucier outpatient after discharge.

## 2019-12-11 NOTE — Progress Notes (Signed)
SATURATION QUALIFICATIONS: (This note is used to comply with regulatory documentation for home oxygen)  Patient Saturations on Room Air at Rest = 95%  Patient Saturations on Room Air while Ambulating = 86%  Patient Saturations on 4 Liters of oxygen while Ambulating = 89%  Please briefly explain why patient needs home oxygen: Unable to maintain adequate oxygenation with activity without supplemental O2.   Georga Hacking [facility] PT Acute Rehabilitation Services Pager [phone] Office [phone]
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
## 2019-12-12 MED ORDER — SODIUM CHLORIDE 0.9 % IV SOLN: INTRAVENOUS | Status: DC
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
## 2019-12-12 NOTE — Progress Notes (Signed)
Patient's bp 86/73; Dr. Garald Braver paged to notify. Patient stated he has not drank much fluids throughout the day. Patient provided with 500 cc's of water. BP recheck 100/61. 1700 Coreg held.

## 2019-12-13 LAB — BASIC METABOLIC PANEL
Anion gap: 8 (ref 5–15)
BUN: 28 mg/dL — ABNORMAL HIGH (ref 6–20)
CO2: 19 mmol/L — ABNORMAL LOW (ref 22–32)
Calcium: 7.9 mg/dL — ABNORMAL LOW (ref 8.9–10.3)
Chloride: 106 mmol/L (ref 98–111)
Creatinine, Ser: 0.79 mg/dL (ref 0.61–1.24)
GFR calc Af Amer: >60 mL/min (ref >60)
GFR calc non Af Amer: >60 mL/min (ref >60)
Glucose, Bld: 119 mg/dL — ABNORMAL HIGH (ref 70–99)
Potassium: 3.9 mmol/L (ref 3.5–5.1)
Sodium: 133 mmol/L — ABNORMAL LOW (ref 135–145)
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
## 2019-12-13 NOTE — Progress Notes (Addendum)
Patient ambulated 150 feet, oxygen saturations dropped to 86% on room air. Pt tachypneic and sob following ambulation. Placed back into chair and instructed to use incentive spirometer and flutter valve.

## 2019-12-14 ENCOUNTER — Other Ambulatory Visit: Payer: Self-pay

## 2019-12-14 LAB — BASIC METABOLIC PANEL
Anion gap: 7 (ref 5–15)
BUN: 17 mg/dL (ref 6–20)
CO2: 20 mmol/L — ABNORMAL LOW (ref 22–32)
Calcium: 7.8 mg/dL — ABNORMAL LOW (ref 8.9–10.3)
Chloride: 108 mmol/L (ref 98–111)
Creatinine, Ser: 0.79 mg/dL (ref 0.61–1.24)
GFR calc Af Amer: >60 mL/min (ref >60)
GFR calc non Af Amer: >60 mL/min (ref >60)
Glucose, Bld: 101 mg/dL — ABNORMAL HIGH (ref 70–99)
Potassium: 3.6 mmol/L (ref 3.5–5.1)
Sodium: 135 mmol/L (ref 135–145)

## 2019-12-14 MED ORDER — METHOCARBAMOL 500 MG PO TABS: 1000.0000 mg | ORAL_TABLET | Freq: Three times a day (TID) | ORAL | 0 refills | Status: DC | PRN

## 2019-12-14 MED ORDER — ACETAMINOPHEN 500 MG PO TABS: 1000.0000 mg | ORAL_TABLET | Freq: Four times a day (QID) | ORAL | 0 refills | Status: DC | PRN

## 2019-12-14 MED ORDER — POLYETHYLENE GLYCOL 3350 17 G PO PACK: 17.0000 g | PACK | Freq: Every day | ORAL | 0 refills | Status: DC | PRN

## 2019-12-14 MED ORDER — DM-GUAIFENESIN ER 30-600 MG PO TB12: 1.0000 | ORAL_TABLET | Freq: Two times a day (BID) | ORAL | 0 refills | Status: DC | PRN

## 2019-12-14 MED ORDER — LEVETIRACETAM 500 MG PO TABS: 500.0000 mg | ORAL_TABLET | Freq: Two times a day (BID) | ORAL | 0 refills | Status: DC

## 2019-12-14 MED ORDER — DOCUSATE SODIUM 100 MG PO CAPS: 100.0000 mg | ORAL_CAPSULE | Freq: Two times a day (BID) | ORAL | 0 refills | Status: DC

## 2019-12-14 MED ORDER — PANTOPRAZOLE SODIUM 40 MG PO TBEC: 40.0000 mg | DELAYED_RELEASE_TABLET | Freq: Every day | ORAL | 0 refills | Status: AC
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
## 2019-12-14 NOTE — Progress Notes (Addendum)
[facility] dropped off two oxygen tanks to bedside. Called wife to update on discharge. Wife on way for patient.

## 2019-12-14 NOTE — Care Management (Addendum)
Notified [facility] that patient will DC today. Per previous notes patient approved for LOG for home oxygen with [facility], however oxygen for home use is not in room at this time. I have notified Keon w [facility] that patient will go home today and needs home O2 via LOG delivered to his room now.  Ledell Noss has guaranteed that home concentrator will be delivered tonight, but it may be after 5pm.  I spoke w the daughter Doree Fudge at patient's request.  [facility] to call her to set up delivery time, she knows to expect their call and to answer the phone.  Delivery address is [address] [place] [address]   Phoebe Sharps Daughter   [phone]

## 2019-12-14 NOTE — Discharge Planning (Signed)
Nsg Discharge Note  Admit Date:  11/29/2019 Discharge date: 12/14/2019   John Byrd to be D/C'd Home per MD order.  AVS completed.   Discharge Medication: Allergies as of 12/14/2019   No Known Allergies     Medication List    STOP taking these medications   aspirin EC 81 MG tablet   carvedilol 6.25 MG tablet Commonly known as: COREG   ibuprofen 600 MG tablet Commonly known as: ADVIL     TAKE these medications   acetaminophen 500 MG tablet Commonly known as: TYLENOL Take 2 tablets (1,000 mg total) by mouth every 6 (six) hours as needed for moderate pain.   dextromethorphan-guaiFENesin 30-600 MG 12hr tablet Commonly known as: MUCINEX DM Take 1 tablet by mouth 2 (two) times daily as needed for cough.   docusate sodium 100 MG capsule Commonly known as: COLACE Take 1 capsule (100 mg total) by mouth 2 (two) times daily.   levETIRAcetam 500 MG tablet Commonly known as: Keppra Take 1 tablet (500 mg total) by mouth 2 (two) times daily.   methocarbamol 500 MG tablet Commonly known as: ROBAXIN Take 2 tablets (1,000 mg total) by mouth every 8 (eight) hours as needed for muscle spasms.   pantoprazole 40 MG tablet Commonly known as: PROTONIX Take 1 tablet (40 mg total) by mouth daily. Start taking on: December 15, 2019   polyethylene glycol 17 g packet Commonly known as: MIRALAX / GLYCOLAX Take 17 g by mouth daily as needed for moderate constipation.            Durable Medical Equipment  (From admission, onward)         Start     Ordered   12/09/19 1046  For home use only DME oxygen  Once    Question Answer Comment  Length of Need 6 Months   Liters per Minute 5   Frequency Continuous (stationary and portable oxygen unit needed)   Oxygen delivery system Gas      12/09/19 1045   12/08/19 1559  For home use only DME Walker rolling  Once    Comments: 5 wheels  Question:  Patient needs a walker to treat with the following condition  Answer:   Balance disorder   12/08/19 1558   12/03/19 1020  For home use only DME Walker rolling  Once    Question:  Patient needs a walker to treat with the following condition  Answer:  Multiple fractures of ribs, bilateral, initial encounter for closed fracture   12/03/19 1019          Discharge Assessment: Vitals:   12/14/19 0830 12/14/19 1308  BP: 101/68 108/70  Pulse:  100  Resp: 18   Temp: 98 F (36.7 C) 98.1 F (36.7 C)  SpO2: 97% 97%   Skin clean, dry and intact without evidence of skin break down, no evidence of skin tears noted. IV catheter discontinued intact. Site without signs and symptoms of complications - no redness or edema noted at insertion site, patient denies c/o pain - only slight tenderness at site.  Dressing with slight pressure applied.  D/c Instructions-Education: Discharge instructions given to patient/family with verbalized understanding. D/c education completed with patient/family including follow up instructions, medication list, d/c activities limitations if indicated, with other d/c instructions as indicated by MD - patient able to verbalize understanding, all questions fully answered. Patient instructed to return to ED, call 911, or call MD for any changes in condition.  Patient escorted via WC, and  D/C home via private auto.  Hiram Comber, RN 12/14/2019 5:57 PM

## 2019-12-15 ENCOUNTER — Encounter (INDEPENDENT_AMBULATORY_CARE_PROVIDER_SITE_OTHER): Payer: Self-pay | Admitting: Primary Care

## 2019-12-15 MED FILL — METHOCARBAMOL 500 MG TABS: 500 | 5 days supply | Qty: 30 | Fill #0

## 2019-12-15 MED FILL — PANTOPRAZOLE SOD DR 40 MG T: 40 | 30 days supply | Qty: 30 | Fill #0

## 2019-12-15 MED FILL — levETIRAcetam 500 MG TABS: 500 | 30 days supply | Qty: 60 | Fill #0

## 2020-01-09 MED FILL — IBUPROFEN 600 MG TABLET: 600 | 30 days supply | Qty: 90 | Fill #0

## 2020-01-14 ENCOUNTER — Other Ambulatory Visit (INDEPENDENT_AMBULATORY_CARE_PROVIDER_SITE_OTHER): Payer: Self-pay | Admitting: Primary Care

## 2020-01-14 ENCOUNTER — Telehealth (INDEPENDENT_AMBULATORY_CARE_PROVIDER_SITE_OTHER): Payer: Self-pay

## 2020-01-14 DIAGNOSIS — I509 Heart failure, unspecified: Secondary | ICD-10-CM

## 2020-01-14 NOTE — Telephone Encounter (Signed)
kendra physical therapist with [facility] while with patient to request a discontinue oxygen order be sent to [facility]. Patient was released on oxygen and does not require it. Physical therapist and patient were out walking after 4 minutes patients oxygen was 95. Patient oxygen at rest was 97. If patient sends oxygen back he will have to sign a document stating he is returning it AMA; if needed in the future will be unable to get. Please print order and CMA will fax to [facility] at [phone]. Maryjean Morn, CMA

## 2020-02-05 IMAGING — DX DG CHEST 2V
2 series · 2 of 2 positions shown · non-contrast
Comparison: None.

CLINICAL DATA: Shortness of breath for 2 weeks.

EXAM:
CHEST - 2 VIEW

[dg chest 2 view (1 of 2)]
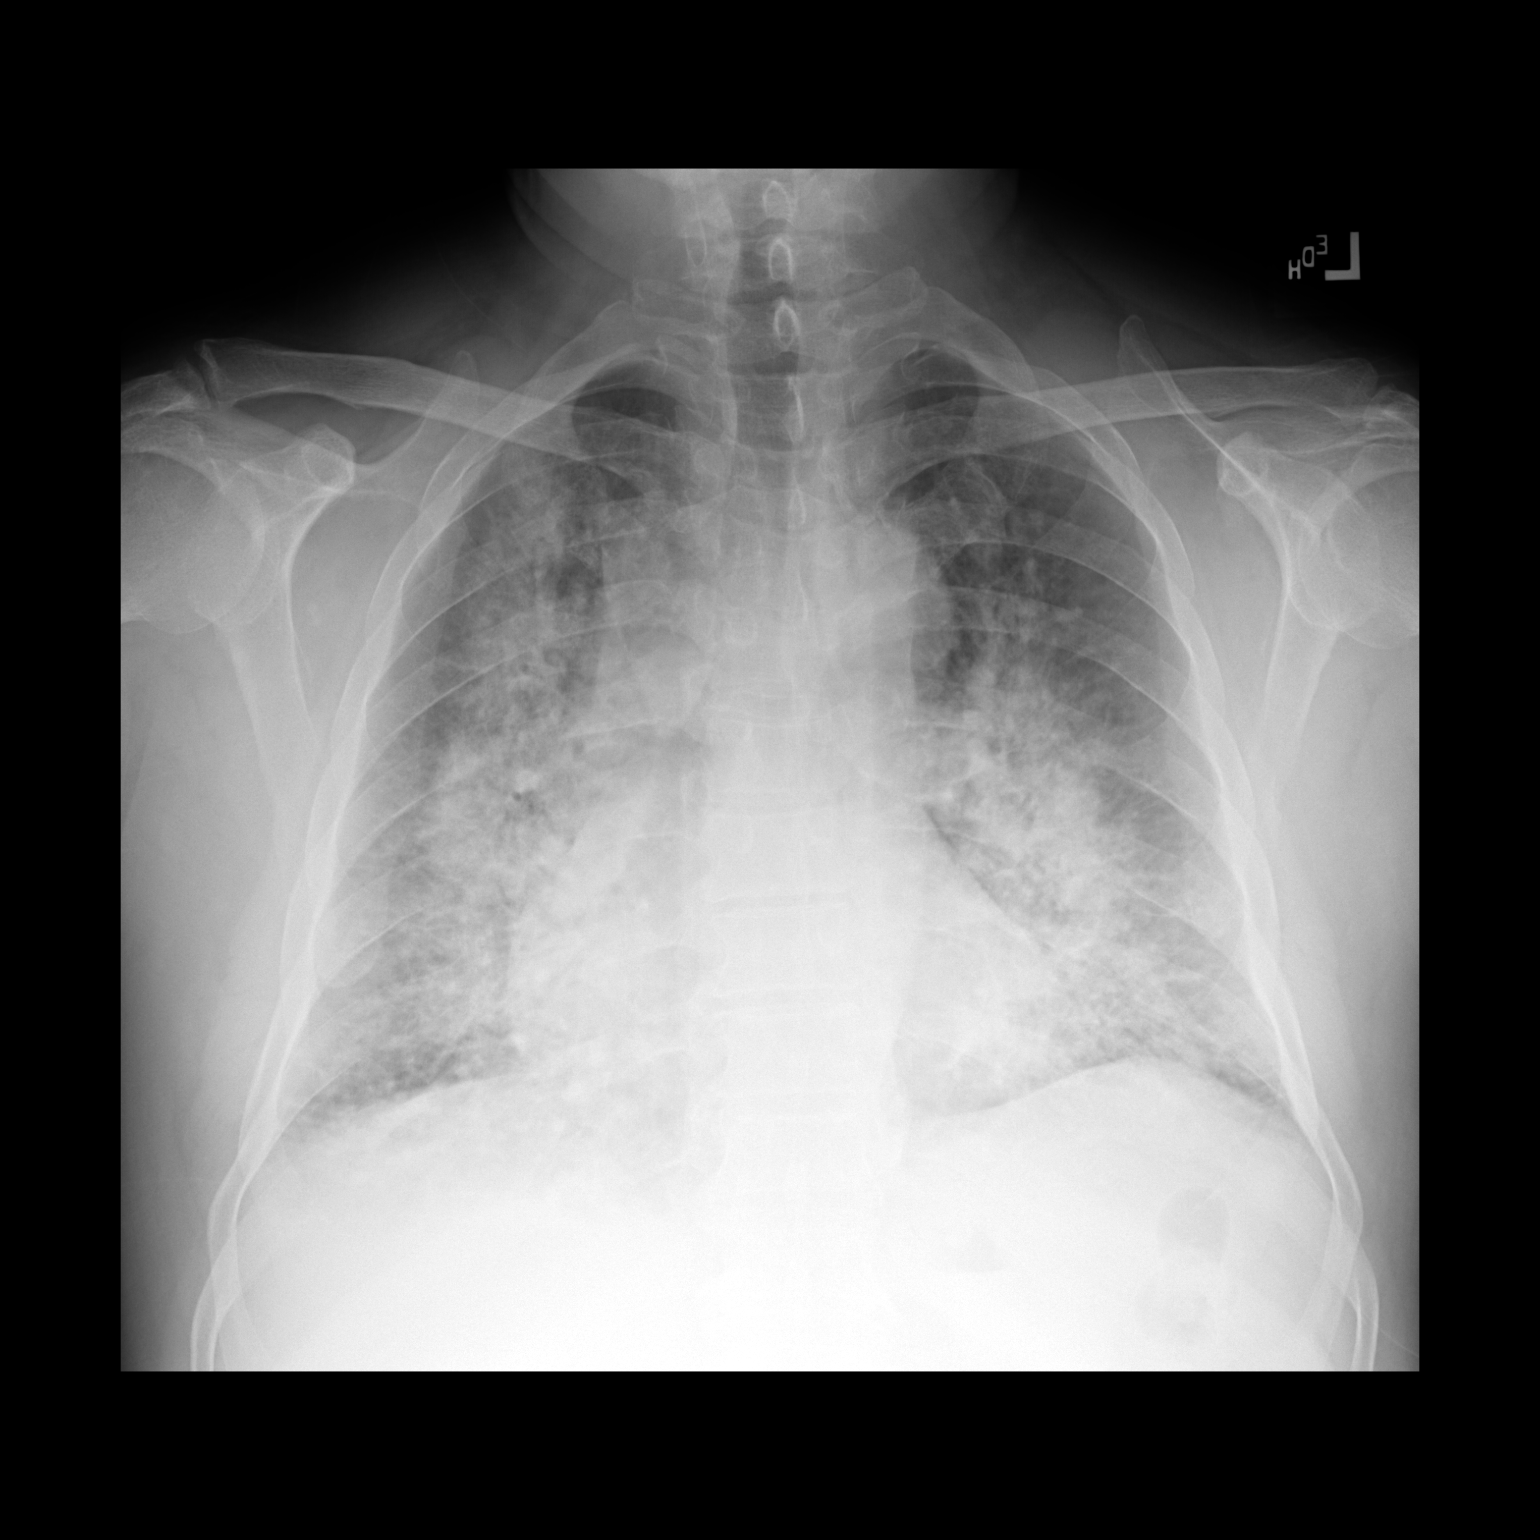

[dg chest 2 view (2 of 2)]
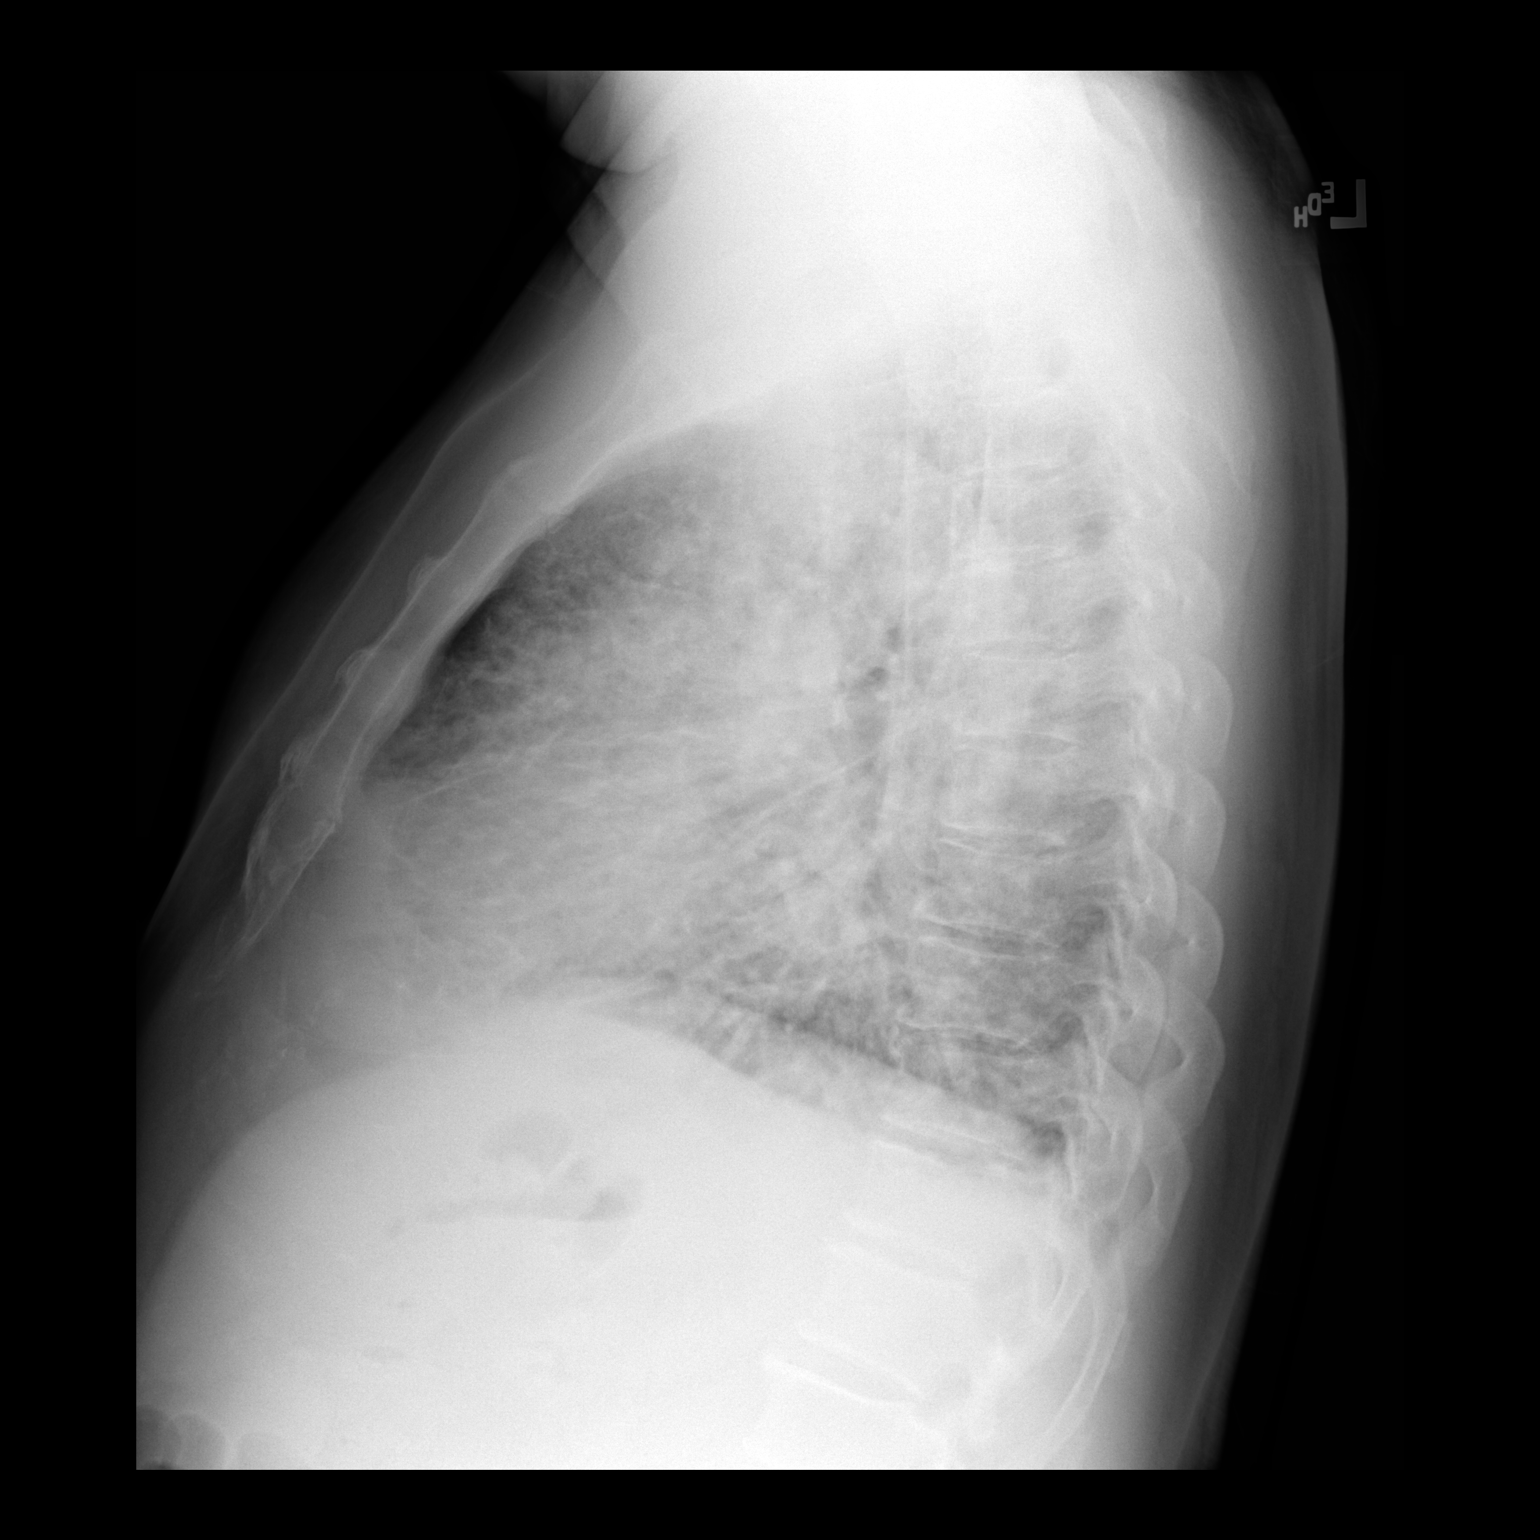

[2 of 2 positions shown; findings below may reference images not displayed]

FINDINGS: Mild cardiomegaly. Symmetric bilateral pulmonary airspace disease
with central predominance, suspicious for diffuse pulmonary edema or
possibly infection. No evidence of pneumothorax or pleural effusion.
IMPRESSION: Symmetric bilateral airspace disease with central predominance,
suspicious for pulmonary edema with infection considered less
likely.

## 2020-02-16 IMAGING — DX DG CHEST 1V PORT
1 series · 1 of 1 positions shown · non-contrast
Comparison: 03/09/2018

CLINICAL DATA: Endotracheal and central line placements

EXAM:
PORTABLE CHEST 1 VIEW

[chest ap]
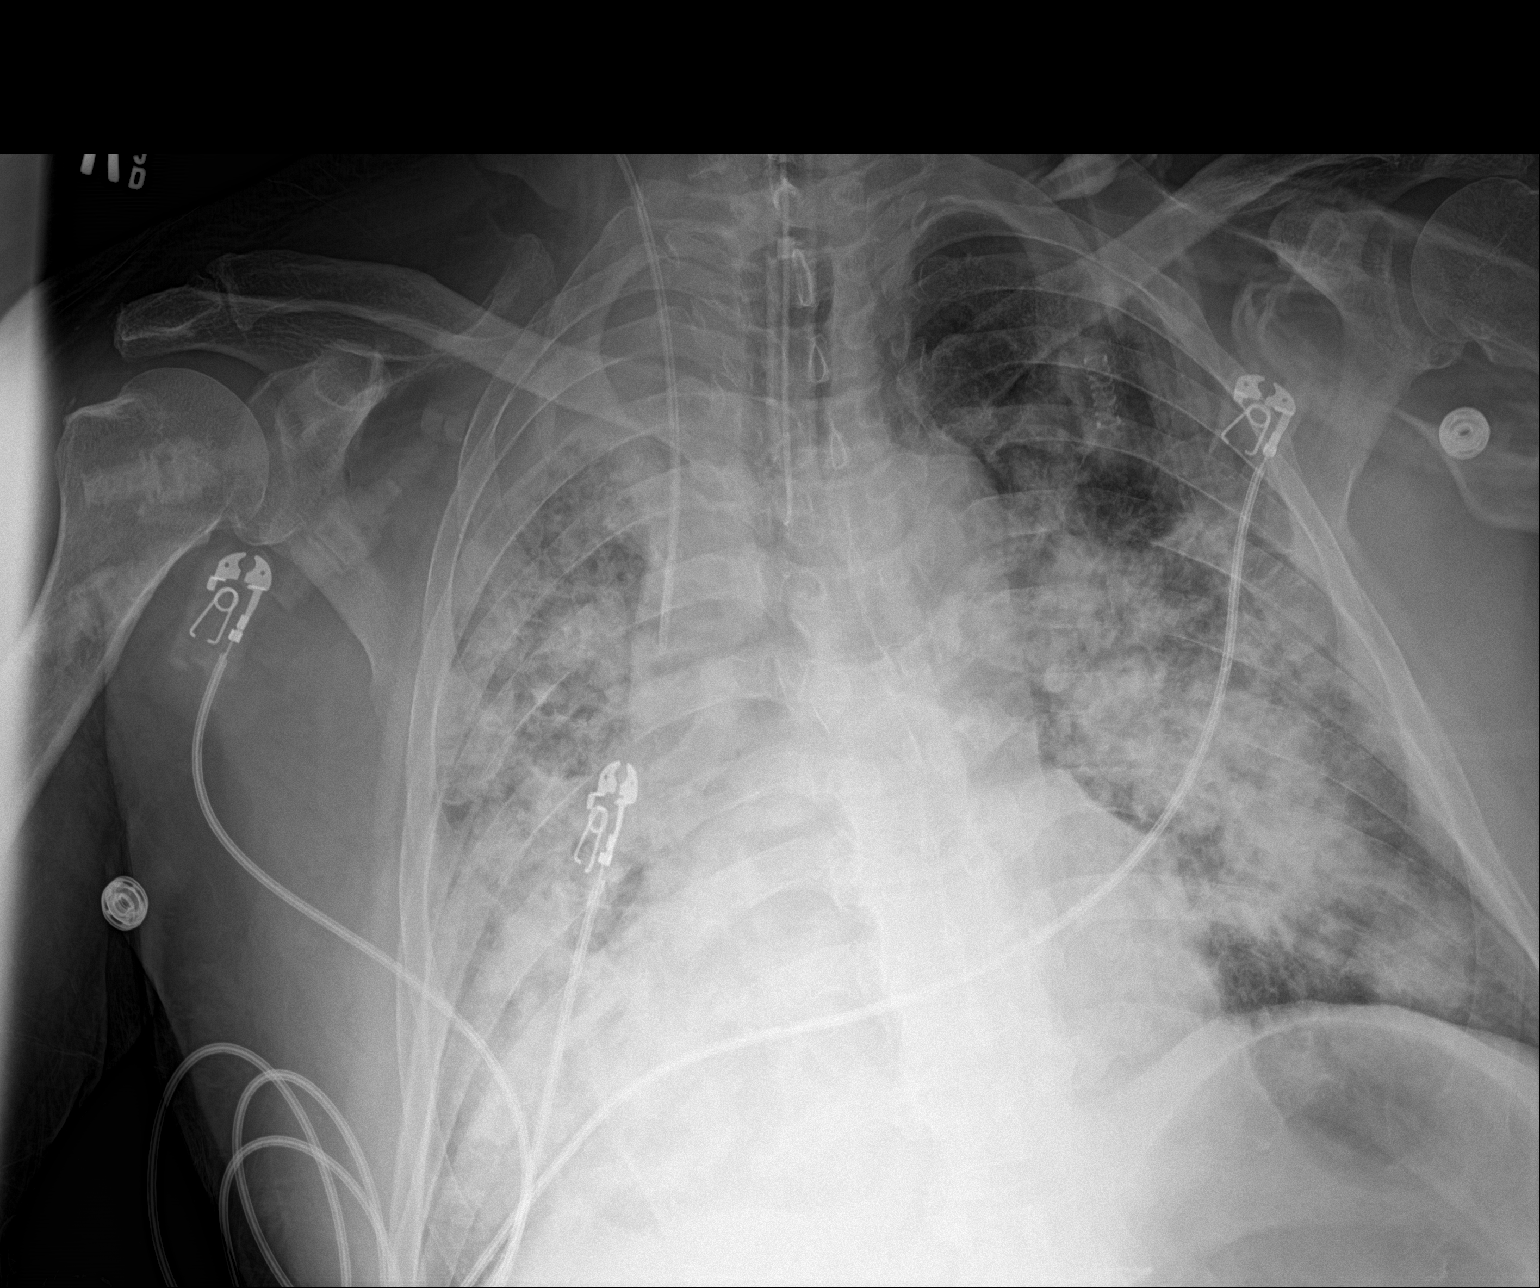

[1 of 1 positions shown; findings below may reference images not displayed]

FINDINGS: Endotracheal tube is been placed with tip measuring 3.4 cm above the
carina. Right central venous catheter with tip over the mid SVC
region. No pneumothorax. Shallow inspiration. Mild cardiac
enlargement. Bilateral perihilar infiltrates could indicate edema,
pneumonia, or ARDS. Increased density in the right apex could
represent pleural fluid. This could be due to a layering effusion.
In the setting of central venous catheter placement, hematoma is not
excluded.
IMPRESSION: Appliances appear in satisfactory position. Developing fluid in the
right apex may represent layering pleural effusion or possibly
hematoma. Persistent bilateral pulmonary infiltrates.

## 2020-02-18 IMAGING — DX DG CHEST 1V PORT
1 series · 1 of 1 positions shown · non-contrast
Comparison: March 11, 2018 study obtained earlier in the day

CLINICAL DATA: Hypoxia

EXAM:
PORTABLE CHEST 1 VIEW

[chest ap]
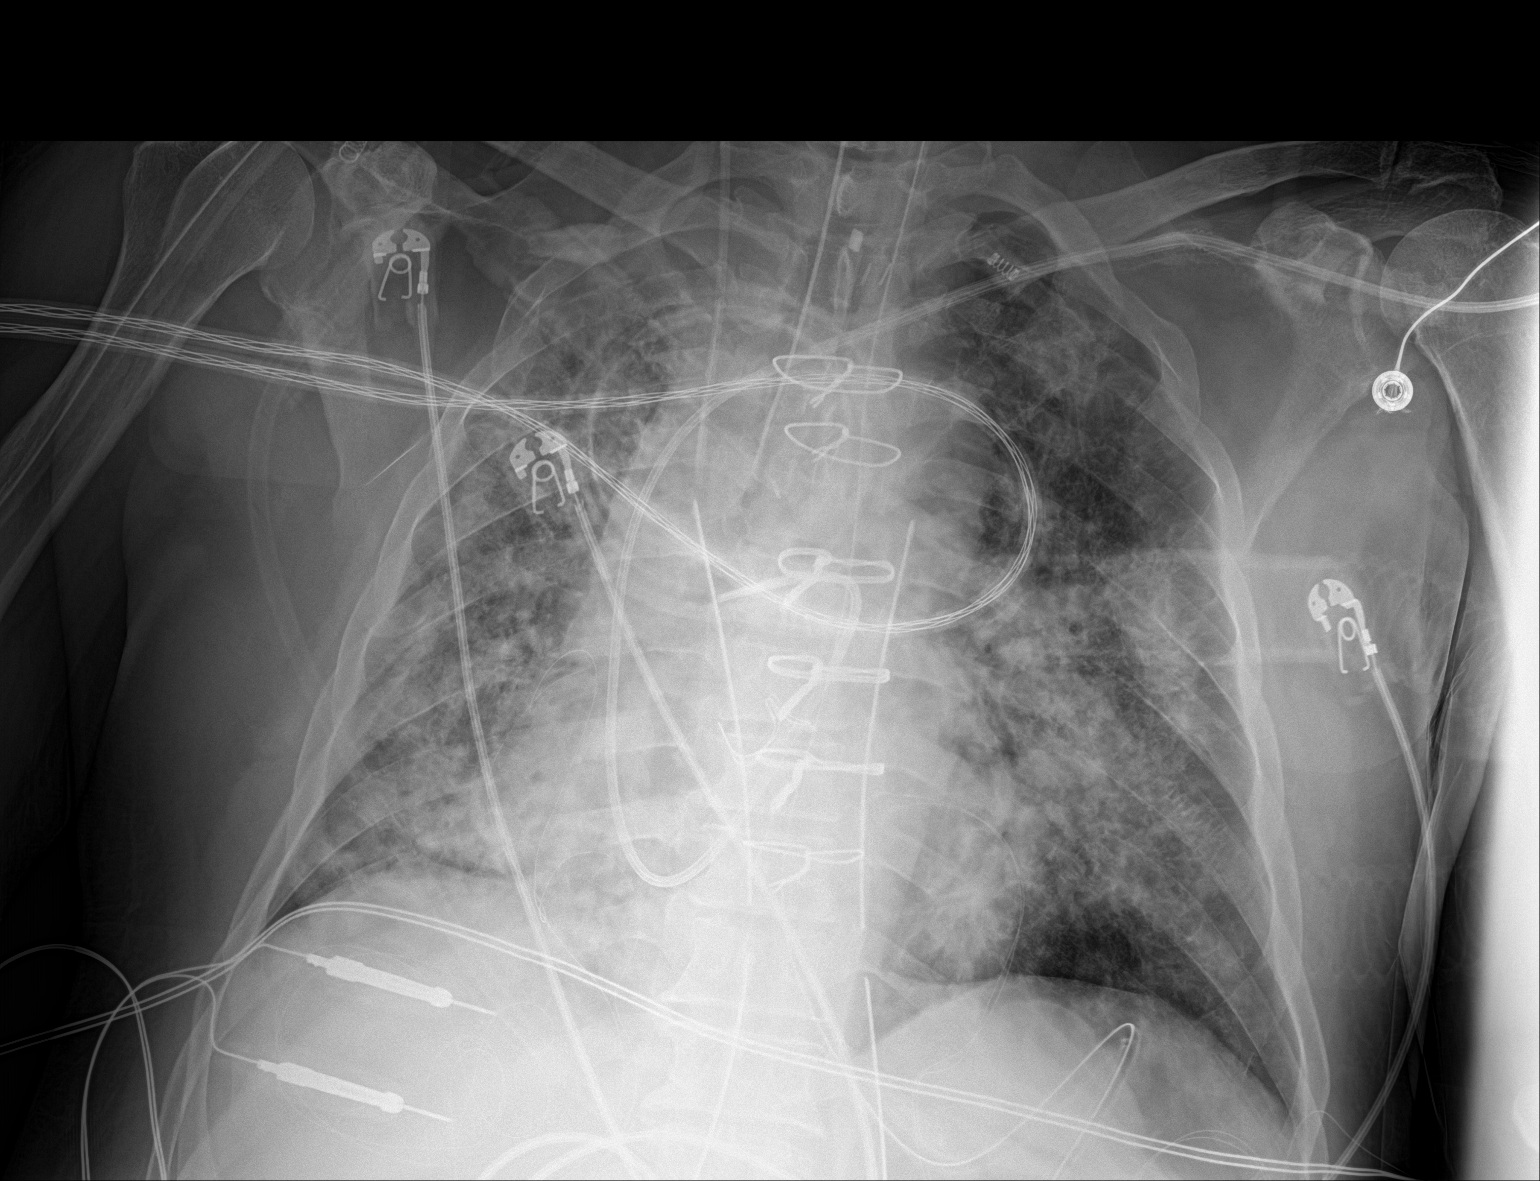

[1 of 1 positions shown; findings below may reference images not displayed]

FINDINGS: Endotracheal tube tip is 1.6 cm above the carina. Central catheter
tip is in superior vena cava. There are bilateral chest tubes.
Nasogastric tube tip and side port are below the diaphragm. There
are temporary pacemaker leads attached to the right heart. No
pneumothorax. There is patchy interstitial and alveolar edema
bilaterally with somewhat less consolidation in the left lower lobe
compared to earlier in the day. No new opacity. Heart size and
pulmonary vascularity are normal. No adenopathy.
IMPRESSION: Tube and catheter positions as described without pneumothorax. Note
that the endotracheal tube tip is close to the carina and may wish
to be withdrawn 2-3 cm.

Extensive interstitial and patchy alveolar opacity, likely edema,
somewhat less on the left than on the study obtained earlier in the
day. Changes on the right are similar.
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
## 2020-02-19 ENCOUNTER — Ambulatory Visit (INDEPENDENT_AMBULATORY_CARE_PROVIDER_SITE_OTHER): Payer: Self-pay | Admitting: Family Medicine

## 2020-02-19 ENCOUNTER — Encounter (INDEPENDENT_AMBULATORY_CARE_PROVIDER_SITE_OTHER): Payer: Self-pay | Admitting: Family Medicine

## 2020-02-19 ENCOUNTER — Other Ambulatory Visit: Payer: Self-pay | Admitting: Family Medicine

## 2020-02-19 ENCOUNTER — Other Ambulatory Visit: Payer: Self-pay

## 2020-02-19 DIAGNOSIS — Z952 Presence of prosthetic heart valve: Secondary | ICD-10-CM

## 2020-02-19 DIAGNOSIS — R0609 Other forms of dyspnea: Secondary | ICD-10-CM

## 2020-02-19 DIAGNOSIS — Z8781 Personal history of (healed) traumatic fracture: Secondary | ICD-10-CM

## 2020-02-19 DIAGNOSIS — M791 Myalgia, unspecified site: Secondary | ICD-10-CM

## 2020-02-19 DIAGNOSIS — I10 Essential (primary) hypertension: Secondary | ICD-10-CM

## 2020-02-19 DIAGNOSIS — Z8616 Personal history of COVID-19: Secondary | ICD-10-CM

## 2020-02-19 MED ORDER — ALBUTEROL SULFATE HFA 108 (90 BASE) MCG/ACT IN AERS: 2.0000 | INHALATION_SPRAY | Freq: Four times a day (QID) | RESPIRATORY_TRACT | 1 refills | Status: DC | PRN

## 2020-02-19 MED ORDER — CARVEDILOL 6.25 MG PO TABS: 6.2500 mg | ORAL_TABLET | Freq: Two times a day (BID) | ORAL | 6 refills | Status: AC

## 2020-02-19 MED ORDER — METHOCARBAMOL 500 MG PO TABS: 1000.0000 mg | ORAL_TABLET | Freq: Three times a day (TID) | ORAL | 1 refills | Status: DC | PRN

## 2020-02-19 MED ORDER — IBUPROFEN 600 MG PO TABS: 600.0000 mg | ORAL_TABLET | Freq: Three times a day (TID) | ORAL | 1 refills | Status: DC | PRN

## 2020-02-19 MED ORDER — MISC. DEVICES MISC: 0 refills | Status: DC

## 2020-02-19 MED FILL — ALBUTEROL SULFATE HFA 108 (: 108 (90 BAS | 25 days supply | Qty: 18 | Fill #0

## 2020-02-19 MED FILL — METHOCARBAMOL 500 MG TABS: 500 | 20 days supply | Qty: 60 | Fill #0

## 2020-02-19 MED FILL — CARVEDILOL 6.25 MG TABLET: 6.25 | 30 days supply | Qty: 60 | Fill #0

## 2020-02-19 MED FILL — IBUPROFEN 600 MG TABLET: 600 | 30 days supply | Qty: 90 | Fill #0
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
## 2020-02-19 NOTE — Progress Notes (Signed)
Pt is still having pain on the right side of his head, left hand, throat, arms, knees and hips States he feels something in his chest describes it as wheezing  It is taking a lot of effort to breathe especially with exercising and intercourse

## 2020-02-20 ENCOUNTER — Other Ambulatory Visit (INDEPENDENT_AMBULATORY_CARE_PROVIDER_SITE_OTHER): Payer: Self-pay | Admitting: Family Medicine

## 2020-02-20 MED ORDER — MISC. DEVICES MISC: 0 refills | Status: DC

## 2020-02-24 ENCOUNTER — Other Ambulatory Visit: Payer: Self-pay

## 2020-02-24 ENCOUNTER — Encounter: Payer: Self-pay | Admitting: Family Medicine

## 2020-02-24 ENCOUNTER — Ambulatory Visit (INDEPENDENT_AMBULATORY_CARE_PROVIDER_SITE_OTHER): Payer: Self-pay | Admitting: Family Medicine

## 2020-02-24 ENCOUNTER — Ambulatory Visit: Payer: Self-pay

## 2020-02-24 DIAGNOSIS — M25562 Pain in left knee: Secondary | ICD-10-CM

## 2020-02-24 DIAGNOSIS — S32415D Nondisplaced fracture of anterior wall of left acetabulum, subsequent encounter for fracture with routine healing: Secondary | ICD-10-CM

## 2020-02-24 DIAGNOSIS — M542 Cervicalgia: Secondary | ICD-10-CM

## 2020-02-24 DIAGNOSIS — M25512 Pain in left shoulder: Secondary | ICD-10-CM

## 2020-02-24 MED ORDER — CELECOXIB 200 MG PO CAPS: 200.0000 mg | ORAL_CAPSULE | Freq: Two times a day (BID) | ORAL | 6 refills | Status: DC | PRN

## 2020-02-24 MED ORDER — TIZANIDINE HCL 2 MG PO TABS: 2.0000 mg | ORAL_TABLET | Freq: Four times a day (QID) | ORAL | 1 refills | Status: DC | PRN

## 2020-02-24 MED FILL — CELECOXIB 200 MG CAP: 200 | 30 days supply | Qty: 60 | Fill #0

## 2020-02-24 MED FILL — tiZANidine HCL 2 MG TABS: 2 | 7 days supply | Qty: 60 | Fill #0
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
## 2020-03-12 ENCOUNTER — Ambulatory Visit (INDEPENDENT_AMBULATORY_CARE_PROVIDER_SITE_OTHER): Payer: Self-pay | Admitting: Physical Therapy

## 2020-03-12 ENCOUNTER — Encounter: Payer: Self-pay | Admitting: Physical Therapy

## 2020-03-12 ENCOUNTER — Other Ambulatory Visit: Payer: Self-pay

## 2020-03-12 DIAGNOSIS — M25562 Pain in left knee: Secondary | ICD-10-CM

## 2020-03-12 DIAGNOSIS — M25511 Pain in right shoulder: Secondary | ICD-10-CM

## 2020-03-12 DIAGNOSIS — M542 Cervicalgia: Secondary | ICD-10-CM

## 2020-03-12 DIAGNOSIS — M545 Low back pain: Secondary | ICD-10-CM

## 2020-03-12 DIAGNOSIS — G8929 Other chronic pain: Secondary | ICD-10-CM

## 2020-03-12 DIAGNOSIS — M25552 Pain in left hip: Secondary | ICD-10-CM

## 2020-03-12 DIAGNOSIS — M6281 Muscle weakness (generalized): Secondary | ICD-10-CM

## 2020-03-12 DIAGNOSIS — R2689 Other abnormalities of gait and mobility: Secondary | ICD-10-CM

## 2020-03-12 DIAGNOSIS — M25561 Pain in right knee: Secondary | ICD-10-CM

## 2020-03-12 DIAGNOSIS — M25551 Pain in right hip: Secondary | ICD-10-CM

## 2020-03-12 DIAGNOSIS — M25512 Pain in left shoulder: Secondary | ICD-10-CM

## 2020-03-12 NOTE — Patient Instructions (Signed)
Access Code: [email or web address] URL: [email or web address] Date: 03/12/2020 Prepared by: Rosana Hoes  Exercises Seated Cervical Sidebending Stretch - 2 x daily - 7 x weekly - 2 reps - 15 seconds hold Seated Levator Scapulae Stretch - 2 x daily - 7 x weekly - 2 reps - 15 seconds hold Banded Row - 2 x daily - 7 x weekly - 2 sets - 10 reps Shoulder External Rotation and Scapular Retraction with Resistance - 2 x daily - 7 x weekly - 2 sets - 10 reps Hooklying Single Knee to Chest Stretch - 2 x daily - 7 x weekly - 2 reps - 15 seconds hold Supine Lower Trunk Rotation - 2 x daily - 7 x weekly - 10 reps Supine Bridge - 2 x daily - 7 x weekly - 2 sets - 10 reps Supine Active Straight Leg Raise - 2 x daily - 7 x weekly - 2 sets - 10 reps Sidelying Hip Abduction - 2 x daily - 7 x weekly - 2 sets - 10 reps Seated Long Arc Quad - 2 x daily - 7 x weekly - 2 sets - 10 reps
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
## 2020-03-17 ENCOUNTER — Encounter: Payer: Self-pay | Admitting: Physical Therapy

## 2020-03-19 ENCOUNTER — Encounter: Payer: Self-pay | Admitting: Physical Therapy

## 2020-03-19 ENCOUNTER — Other Ambulatory Visit: Payer: Self-pay

## 2020-03-19 ENCOUNTER — Ambulatory Visit (INDEPENDENT_AMBULATORY_CARE_PROVIDER_SITE_OTHER): Payer: Self-pay | Admitting: Physical Therapy

## 2020-03-19 DIAGNOSIS — R2689 Other abnormalities of gait and mobility: Secondary | ICD-10-CM

## 2020-03-19 DIAGNOSIS — M545 Low back pain: Secondary | ICD-10-CM

## 2020-03-19 DIAGNOSIS — M25512 Pain in left shoulder: Secondary | ICD-10-CM

## 2020-03-19 DIAGNOSIS — M25551 Pain in right hip: Secondary | ICD-10-CM

## 2020-03-19 DIAGNOSIS — M25511 Pain in right shoulder: Secondary | ICD-10-CM

## 2020-03-19 DIAGNOSIS — M6281 Muscle weakness (generalized): Secondary | ICD-10-CM

## 2020-03-19 DIAGNOSIS — M542 Cervicalgia: Secondary | ICD-10-CM

## 2020-03-19 DIAGNOSIS — G8929 Other chronic pain: Secondary | ICD-10-CM

## 2020-03-19 DIAGNOSIS — M25561 Pain in right knee: Secondary | ICD-10-CM

## 2020-03-19 DIAGNOSIS — M25552 Pain in left hip: Secondary | ICD-10-CM

## 2020-03-19 DIAGNOSIS — M25562 Pain in left knee: Secondary | ICD-10-CM
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
## 2020-03-22 ENCOUNTER — Encounter: Payer: Self-pay | Admitting: Physical Therapy

## 2020-03-23 ENCOUNTER — Ambulatory Visit: Payer: Self-pay | Admitting: Family Medicine

## 2020-03-24 ENCOUNTER — Encounter: Payer: Self-pay | Admitting: Physical Therapy

## 2020-03-26 ENCOUNTER — Ambulatory Visit (INDEPENDENT_AMBULATORY_CARE_PROVIDER_SITE_OTHER): Payer: Self-pay | Admitting: Physical Therapy

## 2020-03-26 ENCOUNTER — Encounter: Payer: Self-pay | Admitting: Physical Therapy

## 2020-03-26 ENCOUNTER — Ambulatory Visit (INDEPENDENT_AMBULATORY_CARE_PROVIDER_SITE_OTHER): Payer: Self-pay | Admitting: Primary Care

## 2020-03-26 ENCOUNTER — Other Ambulatory Visit: Payer: Self-pay

## 2020-03-26 DIAGNOSIS — G8929 Other chronic pain: Secondary | ICD-10-CM

## 2020-03-26 DIAGNOSIS — M25552 Pain in left hip: Secondary | ICD-10-CM

## 2020-03-26 DIAGNOSIS — M542 Cervicalgia: Secondary | ICD-10-CM

## 2020-03-26 DIAGNOSIS — M545 Low back pain: Secondary | ICD-10-CM

## 2020-03-26 DIAGNOSIS — R2689 Other abnormalities of gait and mobility: Secondary | ICD-10-CM

## 2020-03-26 DIAGNOSIS — M25551 Pain in right hip: Secondary | ICD-10-CM

## 2020-03-26 DIAGNOSIS — M6281 Muscle weakness (generalized): Secondary | ICD-10-CM

## 2020-03-26 DIAGNOSIS — M25561 Pain in right knee: Secondary | ICD-10-CM

## 2020-03-26 DIAGNOSIS — M25511 Pain in right shoulder: Secondary | ICD-10-CM

## 2020-03-26 DIAGNOSIS — M25562 Pain in left knee: Secondary | ICD-10-CM

## 2020-03-26 DIAGNOSIS — M25512 Pain in left shoulder: Secondary | ICD-10-CM
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
## 2020-03-30 ENCOUNTER — Telehealth (INDEPENDENT_AMBULATORY_CARE_PROVIDER_SITE_OTHER): Payer: Self-pay | Admitting: Cardiology

## 2020-03-30 ENCOUNTER — Encounter: Payer: Self-pay | Admitting: Cardiology

## 2020-03-30 ENCOUNTER — Telehealth: Payer: Self-pay | Admitting: *Deleted

## 2020-03-30 VITALS — Ht 60.0 in | Wt 180.0 lb

## 2020-03-30 DIAGNOSIS — Z954 Presence of other heart-valve replacement: Secondary | ICD-10-CM

## 2020-03-30 DIAGNOSIS — Z8616 Personal history of COVID-19: Secondary | ICD-10-CM

## 2020-03-30 DIAGNOSIS — Z952 Presence of prosthetic heart valve: Secondary | ICD-10-CM

## 2020-03-30 DIAGNOSIS — I10 Essential (primary) hypertension: Secondary | ICD-10-CM

## 2020-03-30 DIAGNOSIS — R0609 Other forms of dyspnea: Secondary | ICD-10-CM

## 2020-03-30 DIAGNOSIS — Z87828 Personal history of other (healed) physical injury and trauma: Secondary | ICD-10-CM

## 2020-03-30 DIAGNOSIS — R0602 Shortness of breath: Secondary | ICD-10-CM

## 2020-03-30 DIAGNOSIS — Z789 Other specified health status: Secondary | ICD-10-CM

## 2020-03-30 NOTE — Telephone Encounter (Signed)
  Patient Consent for Virtual Visit         John Byrd has provided verbal consent on 03/30/2020 for a virtual visit (video or telephone).   CONSENT FOR VIRTUAL VISIT FOR:  John Byrd   By participating in this virtual visit I agree to the following:  I hereby voluntarily request, consent and authorize [facility] and its employed or contracted physicians, physician assistants, nurse practitioners or other licensed health care professionals (the Practitioner), to provide me with telemedicine health care services (the "Services") as deemed necessary by the treating Practitioner. I acknowledge and consent to receive the Services by the Practitioner via telemedicine. I understand that the telemedicine visit will involve communicating with the Practitioner through live audiovisual communication technology and the disclosure of certain medical information by electronic transmission. I acknowledge that I have been given the opportunity to request an in-person assessment or other available alternative prior to the telemedicine visit and am voluntarily participating in the telemedicine visit.  I understand that I have the right to withhold or withdraw my consent to the use of telemedicine in the course of my care at any time, without affecting my right to future care or treatment, and that the Practitioner or I may terminate the telemedicine visit at any time. I understand that I have the right to inspect all information obtained and/or recorded in the course of the telemedicine visit and may receive copies of available information for a reasonable fee.  I understand that some of the potential risks of receiving the Services via telemedicine include:  Marland Kitchen Delay or interruption in medical evaluation due to technological equipment failure or disruption; . Information transmitted may not be sufficient (e.g. poor resolution of images) to allow for appropriate medical decision  making by the Practitioner; and/or  . In rare instances, security protocols could fail, causing a breach of personal health information.  Furthermore, I acknowledge that it is my responsibility to provide information about my medical history, conditions and care that is complete and accurate to the best of my ability. I acknowledge that Practitioner's advice, recommendations, and/or decision may be based on factors not within their control, such as incomplete or inaccurate data provided by me or distortions of diagnostic images or specimens that may result from electronic transmissions. I understand that the practice of medicine is not an exact science and that Practitioner makes no warranties or guarantees regarding treatment outcomes. I acknowledge that a copy of this consent can be made available to me via my patient portal [facility] MyChart), or I can request a printed copy by calling the office of [facility].    I understand that my insurance will be billed for this visit.   I have read or had this consent read to me. . I understand the contents of this consent, which adequately explains the benefits and risks of the Services being provided via telemedicine.  . I have been provided ample opportunity to ask questions regarding this consent and the Services and have had my questions answered to my satisfaction. . I give my informed consent for the services to be provided through the use of telemedicine in my medical care

## 2020-03-30 NOTE — Patient Instructions (Addendum)
Medication Instructions:  Your physician recommends that you continue on your current medications as directed. Please refer to the Current Medication list given to you today.  *If you need a refill on your cardiac medications before your next appointment, please call your pharmacy*   Lab Work: NONE ORDERED  TODAY   If you have labs (blood work) drawn today and your tests are completely normal, you will receive your results only by: Marland Kitchen MyChart Message (if you have MyChart) OR . A paper copy in the mail If you have any lab test that is abnormal or we need to change your treatment, we will call you to review the results.   Testing/Procedures: Your physician has requested that you have an echocardiogram. Echocardiography is a painless test that uses sound waves to create images of your heart. It provides your doctor with information about the size and shape of your heart and how well your heart's chambers and valves are working. This procedure takes approximately one hour. There are no restrictions for this procedure.  Follow-Up: At [facility], you and your health needs are our priority.  As part of our continuing mission to provide you with exceptional heart care, we have created designated Provider Care Teams.  These Care Teams include your primary Cardiologist (physician) and Advanced Practice Providers (APPs -  Physician Assistants and Nurse Practitioners) who all work together to provide you with the care you need, when you need it.  We recommend signing up for the patient portal called "MyChart".  Sign up information is provided on this After Visit Summary.  MyChart is used to connect with patients for Virtual Visits (Telemedicine).  Patients are able to view lab/test results, encounter notes, upcoming appointments, etc.  Non-urgent messages can be sent to your provider as well.   To learn more about what you can do with MyChart, go to [email or web address].    Your next appointment:       AFTER ECHOCARDIOGRAM WITH DR  Jens Som  ONLY   The format for your next appointment:   In Person
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
## 2020-03-31 ENCOUNTER — Encounter: Payer: Self-pay | Admitting: Physical Therapy

## 2020-04-01 ENCOUNTER — Ambulatory Visit (HOSPITAL_COMMUNITY)
Admission: RE | Admit: 2020-04-01 | Discharge: 2020-04-01 | Disposition: A | Discharge status: Home / Self Care | Payer: Self-pay | Source: Ambulatory Visit | Attending: Primary Care | Admitting: Primary Care

## 2020-04-01 ENCOUNTER — Other Ambulatory Visit: Payer: Self-pay

## 2020-04-01 ENCOUNTER — Ambulatory Visit (INDEPENDENT_AMBULATORY_CARE_PROVIDER_SITE_OTHER): Payer: Self-pay | Admitting: Primary Care

## 2020-04-01 VITALS — BP 125/87 | HR 95 | Temp 97.5°F | Ht 60.0 in | Wt 186.8 lb

## 2020-04-01 DIAGNOSIS — R0602 Shortness of breath: Secondary | ICD-10-CM

## 2020-04-01 DIAGNOSIS — I82491 Acute embolism and thrombosis of other specified deep vein of right lower extremity: Secondary | ICD-10-CM

## 2020-04-01 MED ORDER — ALBUTEROL SULFATE HFA 108 (90 BASE) MCG/ACT IN AERS: 2.0000 | INHALATION_SPRAY | Freq: Four times a day (QID) | RESPIRATORY_TRACT | 2 refills | Status: DC | PRN

## 2020-04-01 MED FILL — !PROVENTIL HFA 90 MCG INH: 108 (90 BAS | 25 days supply | Qty: 7 | Fill #0

## 2020-04-01 NOTE — Progress Notes (Signed)
Established Patient Office Visit  Subjective:  Patient ID: John Byrd, male    DOB: 15-Dec-1960  Age: 59 y.o. MRN: [id number]  CC: No chief complaint on file.   HPI [name] presents for follow up from dyspnea from COVID on previous appointment   Past Medical History:  Diagnosis Date  . 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 Back pain   . CHF (congestive heart failure) (HCC)   . Closed left acetabular fracture (HCC) 11/30/2019  . Frequent headaches   . Heart murmur   . Hypertension   . Muscle pain     Past Surgical History:  Procedure Laterality Date  . AORTIC VALVE REPLACEMENT N/A 03/11/2018   Procedure: AORTIC VALVE REPLACEMENT (AVR) USING 21 MM MAGNA EASE PERICARDIAL [name], MODEL 3300TFX, SERIAL # [id number];  Surgeon: Loreli Slot, MD;  Location: [facility] OR;  Service: Open Heart Surgery;  Laterality: N/A;  . IR ANGIOGRAM SELECTIVE EACH ADDITIONAL VESSEL  11/29/2019  . IR ANGIOGRAM SELECTIVE EACH ADDITIONAL VESSEL  11/29/2019  . IR ANGIOGRAM SELECTIVE EACH ADDITIONAL VESSEL  11/29/2019  . IR ANGIOGRAM VISCERAL SELECTIVE  11/29/2019  . IR EMBO ART  VEN HEMORR LYMPH EXTRAV  INC GUIDE ROADMAPPING  11/29/2019  . IR US GUIDE VASC ACCESS RIGHT  11/29/2019  . No prior surgery    . VIDEO BRONCHOSCOPY N/A 03/11/2018   Procedure: VIDEO BRONCHOSCOPY;  Surgeon: Loreli Slot, MD;  Location: [facility] OR;  Service: Open Heart Surgery;  Laterality: N/A;    No family history on file.  Social History   Socioeconomic History  . Marital status: Single    Spouse name: Not on file  . Number of children: Not on file  . Years of education: Not on file  . Highest education level: Not on file  Occupational History  . Not on file  Tobacco Use  . Smoking status: Never Smoker  . Smokeless tobacco:  Never Used  Substance and Sexual Activity  . Alcohol use: Yes  . Drug use: Not Currently  . Sexual activity: Not on file  Other Topics Concern  . Not on file  Social History Narrative   ** Merged History Encounter **       Lives with girlfriend John Byrd and her son John Byrd in [place], [place]. 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 Has three adult daughters who live in [place].    Social Determinants of Health   Financial Resource Strain:   . Difficulty of Paying Living Expenses:   Food Insecurity:   . Worried About Programme researcher, broadcasting/film/video in the Last Year:   . Barista in the Last Year:   Transportation Needs:   . Freight forwarder (Medical):   Marland Kitchen Lack of Transportation (Non-Medical):   Physical Activity:   . Days of Exercise per Week:   . Minutes of Exercise per Session:   Stress:   . Feeling of Stress :   Social Connections:   . Frequency of Communication with Friends and Family:   . Frequency of Social Gatherings with Friends and Family:   . Attends Religious Services:   . Active Member of Clubs or Organizations:   . Attends Banker Meetings:   Marland Kitchen Marital Status:   Intimate Partner Violence:   . Fear of Current or Ex-Partner:   .  Emotionally Abused:   Marland Kitchen Physically Abused:   . Sexually Abused:     Outpatient Medications Prior to Visit  Medication Sig Dispense Refill  . albuterol (VENTOLIN HFA) 108 (90 Base) MCG/ACT inhaler Inhale 2 puffs into the lungs every 6 (six) hours as needed for wheezing or shortness of breath. 18 g 1  . carvedilol (COREG) 6.25 MG tablet Take 1 tablet (6.25 mg total) by mouth 2 (two) times daily. 60 tablet 6  . celecoxib (CELEBREX) 200 MG capsule Take 1 capsule (200 mg total) by mouth 2 (two) times daily as needed. 60 capsule 6  . ibuprofen (ADVIL) 600 MG tablet Take 1 tablet (600 mg total) by mouth every 8 (eight) hours as needed. 90 tablet 1  . methocarbamol (ROBAXIN) 500  MG tablet Take 2 tablets (1,000 mg total) by mouth every 8 (eight) hours as needed for muscle spasms. 60 tablet 1  . pantoprazole (PROTONIX) 40 MG tablet Take 1 tablet (40 mg total) by mouth daily. 30 tablet 0  . tiZANidine (ZANAFLEX) 2 MG tablet Take 1-2 tablets (2-4 mg total) by mouth every 6 (six) hours as needed for muscle spasms. 60 tablet 1   No facility-administered medications prior to visit.    No Known Allergies  ROS Review of Systems  Respiratory: Positive for cough and shortness of breath.   All other systems reviewed and are negative.     Objective:    Physical Exam  Constitutional: He is oriented to person, place, and time. He appears well-developed and well-nourished.  HENT:  Head: Normocephalic.  Eyes: Pupils are equal, round, and reactive to light. EOM are normal.  Cardiovascular: Normal rate and regular rhythm.  Pulmonary/Chest: Breath sounds normal.  Musculoskeletal:        General: Normal range of motion.     Cervical back: Normal range of motion and neck supple.     Comments: Right lower ext warm to touch, pain r/o DVT   Neurological: He is alert and oriented to person, place, and time.  Skin: Skin is warm and dry.  Psychiatric: He has a normal mood and affect. His behavior is normal. Judgment and thought content normal.    There were no vitals taken for this visit. Wt Readings from Last 3 Encounters:  03/30/20 180 lb (81.6 kg)  11/30/19 184 lb 8.4 oz (83.7 kg)  10/20/19 179 lb (81.2 kg)     Health Maintenance Due  Topic Date Due  . COVID-19 Vaccine (1) Never done  . COLONOSCOPY  Never done    There are no preventive care reminders to display for this patient.  No results found for: TSH Lab Results  Component Value Date   WBC 13.0 (H) 12/10/2019   HGB 12.5 (L) 12/10/2019   HCT 37.9 (L) 12/10/2019   MCV 91.8 12/10/2019   PLT 341 12/10/2019   Lab Results  Component Value Date   NA 135 12/14/2019   K 3.6 12/14/2019   CO2 20 (L)  12/14/2019   GLUCOSE 101 (H) 12/14/2019   BUN 17 12/14/2019   CREATININE 0.79 12/14/2019   BILITOT 0.8 12/11/2019   ALKPHOS 133 (H) 12/11/2019   AST 55 (H) 12/11/2019   ALT 123 (H) 12/11/2019   PROT 6.7 12/11/2019   ALBUMIN 2.8 (L) 12/11/2019   CALCIUM 7.8 (L) 12/14/2019   ANIONGAP 7 12/14/2019   No results found for: CHOL No results found for: HDL No results found for: [facility] Lab Results  Component Value Date   TRIG 74  03/10/2018   No results found for: [facility] Lab Results  Component Value Date   HGBA1C 5.9 (H) 12/07/2019      Assessment & Plan:   Merrick was seen today for pain and shortness of breath.  Diagnoses and all orders for this visit:  Acute deep vein thrombosis (DVT) of other specified vein of right lower extremity (HCC) -     VAS [place] LOWER EXTREMITY VENOUS (DVT); Future  Shortness of breath COVID has different signs and symptoms-continue will prescribe short acting beta agonist for bronchospasms rule out DVT .  If continues to have complications with shortness of breath and cough will refer to Dr. Delford Field pulmonologist for reevaluation  Other orders -     albuterol (VENTOLIN HFA) 108 (90 Base) MCG/ACT inhaler; Inhale 2 puffs into the lungs every 6 (six) hours as needed for wheezing or shortness of breath.   No orders of the defined types were placed in this encounter.   Follow-up: No follow-ups on file.    Grayce Sessions, NP

## 2020-04-01 NOTE — Patient Instructions (Signed)
Trombosis venosa profunda Deep Vein Thrombosis  La trombosis venosa profunda (TVP) es una afeccin en la que se forma un cogulo de sangre en una vena profunda, como una vena de la parte inferior de la pierna, del muslo o del brazo. Un cogulo es sangre que se ha espesado y convertido en gel o se ha solidificado. Esta afeccin es peligrosa. Puede causar complicaciones graves e incluso potencialmente mortales si el cogulo llega hasta los pulmones y causa una obstruccin (embolia pulmonar). Adems, puede daar las venas de la pierna. Puede causar dolor, hinchazn, manchas y llagas en la pierna (sndrome postrombtico). Cules son las causas? Esta afeccin puede ser causada por lo siguiente:  Una ralentizacin de la circulacin sangunea.  Dao en una vena.  Una afeccin que hace que la sangre se coagule ms fcilmente, como un trastorno de coagulacin hereditario. Qu incrementa el riesgo? Los siguientes factores pueden hacer que usted sea ms propenso a tener esta afeccin:  Tener sobrepeso.  Ser una persona mayor de edad, en especial mayor de 60aos.  Sentarse o acostarse durante ms de cuatro horas.  Estar en el hospital.  Falta de actividad fsica (estilo de vida sedentario).  Embarazo, parto o haber dado a luz recientemente.  Tomar medicamentos que contienen estrgeno, como los medicamentos para prevenir el embarazo.  Fumar.  Antecedentes de cualquiera de los siguientes: ? Cogulos de sangre o una enfermedad de coagulacin de la sangre. ? Enfermedad vascular perifrica. ? Enfermedad inflamatoria del intestino. ? Cncer. ? Enfermedad cardaca. ? Trastornos genticos que afectan la coagulacin de la sangre, tales como la mutacin del factor V Leiden. ? Enfermedades neurolgicas que afectan las piernas (paresia de piernas). ? Una lesin reciente, como un accidente automovilstico. ? Ciruga mayor o de duracin prolongada. ? Una va central colocada en una vena  grande. Cules son los signos o los sntomas? Los sntomas de esta afeccin incluyen los siguientes:  Hinchazn, dolor o sensibilidad en un brazo o una pierna.  Calor, enrojecimiento o manchas en un brazo o una pierna. Si el cogulo est ubicado en la pierna, los sntomas pueden ser ms notorios o empeorar al pararse o caminar. Algunas personas no presentan sntomas. Cmo se diagnostica? Esta afeccin se diagnostica mediante lo siguiente:  Un examen fsico y los antecedentes mdicos.  Estudios, como por ejemplo: ? Anlisis de sangre. Se realizan para determinar qu tan bien coagula la sangre. ? Ecografa. Se realiza para detectar la presencia de cogulos. ? Venograma. Para esta prueba, se inyecta un tinte de contraste en una vena y se toman radiografas para verificar si hay cogulos. Cmo se trata? El tratamiento de esta afeccin depende de lo siguiente:  La causa de su TVP.  Su riesgo de tener una hemorragia y tener ms cogulos.  Otras afecciones que padezca. El tratamiento puede incluir lo siguiente:  Tomar un diluyente sanguneo (anticoagulante). Este tipo de medicamento evita la formacin de cogulos. Se pueden administrar por va oral, con una inyeccin debajo de la piel o a travs de una va intravenosa (catter).  Inyeccin de medicamentos para disolver los cogulos en la vena afectada (tromblisis dirigida por catter).  Someterse a una ciruga. Se puede realizar una ciruga para lo siguiente: ? Extraer el cogulo. ? Colocar un filtro en una vena grande para capturar los cogulos de sangre antes de que lleguen a los pulmones. Es posible que algunos tratamientos deban continuarse hasta por seis meses. Siga estas indicaciones en su casa: Si toma anticoagulantes, tenga en cuenta lo siguiente:    Tmelos exactamente como se lo haya indicado el mdico. Algunos anticoagulantes deben tomarse a la misma hora todos los das. No se saltee una dosis.  Hable con su mdico antes  de tomar cualquier medicamento que contenga aspirina o antiinflamatorios no esteroides (AINE). Estos medicamentos aumentan el riesgo de tener una hemorragia peligrosa.  Pregntele a su mdico sobre los alimentos y medicamentos que pueden cambiar la forma en que funciona el medicamento (pueden interactuar). Evtelos si el mdico se lo indica.  Los anticoagulantes pueden causar hematomas fcilmente y hacer que dejar de sangrar sea ms difcil. Por este motivo: ? Tenga sumo cuidado al usar cuchillos, tijeras u otros objetos filosos. ? Afitese con una rasuradora elctrica en lugar de hacerlo con una hoja de afeitar. ? Evite actividades que podran causar lesiones o moretones y siga las indicaciones acerca de cmo prevenir las cadas.  Use un brazalete de alerta mdica o lleve una tarjeta que muestre qu medicamentos toma. Instrucciones generales  Tome los medicamentos de venta libre y los recetados solamente como se lo haya indicado el mdico.  Reanude sus actividades normales como se lo haya indicado el mdico. Pregntele al mdico qu actividades son seguras para usted.  Use medias de compresin si se lo recomienda el mdico.  Concurra a todas las visitas de control como se lo haya indicado el mdico. Esto es importante. Cmo se evita? Para disminuir el riesgo de volver a sufrir esta afeccin:  Durante 30 minutos o ms todos los das, realice una actividad que: ? Implique mover los brazos y las piernas. ? Aumente su frecuencia cardaca.  Cuando viaje durante ms de cuatro horas: ? Haga ejercicio con los brazos y las piernas cada una hora. ? Beba abundante agua. ? Evite el consumo de alcohol.  Evite permanecer sentado o parado durante perodos prolongados sin mover las piernas.  Si se somete a una ciruga o est hospitalizado, pregunte cmo evitar los cogulos de sangre. Esto puede incluir caminar frecuentemente o tomar anticoagulantes.  Mantenga un peso saludable.  Si es una  mujer mayor de 35aos, evite el uso innecesario de medicamentos que contengan estrgeno, como las pldoras anticonceptivas.  No consuma ningn producto que contenga nicotina o tabaco, como cigarrillos y cigarrillos electrnicos. Esto es de especial importancia si toma medicamentos con estrgeno. Si necesita ayuda para dejar de fumar, consulte al mdico. Comunquese con un mdico si:  Se saltea una dosis del anticoagulante.  Su perodo menstrual es ms abundante que lo normal.  Tiene hematomas fuera de lo comn. Solicite ayuda de inmediato si:  Tiene los siguientes sntomas: ? Dolor, hinchazn o enrojecimiento nuevos en un brazo o una pierna, o un aumento de alguno de estos sntomas. ? Entumecimiento u hormigueo en un brazo o una pierna. ? Falta de aire. ? Dolor en el pecho. ? Latidos cardacos irregulares o rpidos. ? Dolor de cabeza intenso o confusin. ? Un corte que no deja de sangrar.  Observa sangre en el vmito, las heces o la orina.  Sufre una cada o un accidente grave o se golpea la cabeza.  Se siente mareado o siente que va a desvanecerse.  Tose y escupe sangre. Estos sntomas pueden representar un problema grave que constituye una emergencia. No espere hasta que los sntomas desaparezcan. Solicite atencin mdica de inmediato. Comunquese con el servicio de emergencias de su localidad (911 en los [place]). No conduzca por sus propios medios hasta el hospital. Resumen  La trombosis venosa profunda (TVP) es una afeccin en la que   se forma un cogulo de sangre en una vena profunda, como una vena de la parte inferior de la pierna, del muslo o del brazo.  Los sntomas pueden incluir hinchazn, calor, dolor, enrojecimiento en el brazo o la pierna.  Esta afeccin puede tratarse con un diluyente de la sangre (anticoagulante), un medicamento que se inyecta para disolver los cogulos de sangre,medias de compresin o ciruga.  Si le recetan anticoagulantes, tmelos  exactamente como se lo hayan indicado. Esta informacin no tiene como fin reemplazar el consejo del mdico. Asegrese de hacerle al mdico cualquier pregunta que tenga. Document Revised: 06/05/2017 Document Reviewed: 06/05/2017 Elsevier Patient Education  2020 Elsevier Inc.  

## 2020-04-01 NOTE — Progress Notes (Signed)
Pt states he gets very tired at work and agitates easily during work and sexual intercourse

## 2020-04-02 ENCOUNTER — Encounter: Payer: Self-pay | Admitting: Physical Therapy

## 2020-04-02 ENCOUNTER — Ambulatory Visit (INDEPENDENT_AMBULATORY_CARE_PROVIDER_SITE_OTHER): Payer: Self-pay | Admitting: Rehabilitative and Restorative Service Providers"

## 2020-04-02 ENCOUNTER — Encounter: Payer: Self-pay | Admitting: Rehabilitative and Restorative Service Providers"

## 2020-04-02 DIAGNOSIS — R2689 Other abnormalities of gait and mobility: Secondary | ICD-10-CM

## 2020-04-02 DIAGNOSIS — M25561 Pain in right knee: Secondary | ICD-10-CM

## 2020-04-02 DIAGNOSIS — G8929 Other chronic pain: Secondary | ICD-10-CM

## 2020-04-02 DIAGNOSIS — M25562 Pain in left knee: Secondary | ICD-10-CM

## 2020-04-02 DIAGNOSIS — M542 Cervicalgia: Secondary | ICD-10-CM

## 2020-04-02 DIAGNOSIS — M545 Low back pain, unspecified: Secondary | ICD-10-CM

## 2020-04-02 DIAGNOSIS — M25512 Pain in left shoulder: Secondary | ICD-10-CM

## 2020-04-02 DIAGNOSIS — M25511 Pain in right shoulder: Secondary | ICD-10-CM

## 2020-04-02 DIAGNOSIS — M25551 Pain in right hip: Secondary | ICD-10-CM

## 2020-04-02 DIAGNOSIS — M25552 Pain in left hip: Secondary | ICD-10-CM

## 2020-04-02 DIAGNOSIS — M6281 Muscle weakness (generalized): Secondary | ICD-10-CM
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
## 2020-04-09 ENCOUNTER — Other Ambulatory Visit: Payer: Self-pay

## 2020-04-09 ENCOUNTER — Ambulatory Visit (INDEPENDENT_AMBULATORY_CARE_PROVIDER_SITE_OTHER): Payer: Self-pay | Admitting: Physical Therapy

## 2020-04-09 DIAGNOSIS — M25562 Pain in left knee: Secondary | ICD-10-CM

## 2020-04-09 DIAGNOSIS — M25551 Pain in right hip: Secondary | ICD-10-CM

## 2020-04-09 DIAGNOSIS — M25552 Pain in left hip: Secondary | ICD-10-CM

## 2020-04-09 DIAGNOSIS — M545 Low back pain, unspecified: Secondary | ICD-10-CM

## 2020-04-09 DIAGNOSIS — M25511 Pain in right shoulder: Secondary | ICD-10-CM

## 2020-04-09 DIAGNOSIS — G8929 Other chronic pain: Secondary | ICD-10-CM

## 2020-04-09 DIAGNOSIS — M25512 Pain in left shoulder: Secondary | ICD-10-CM

## 2020-04-09 DIAGNOSIS — M25561 Pain in right knee: Secondary | ICD-10-CM
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
## 2020-04-16 ENCOUNTER — Ambulatory Visit (INDEPENDENT_AMBULATORY_CARE_PROVIDER_SITE_OTHER): Payer: Self-pay | Admitting: Physical Therapy

## 2020-04-16 ENCOUNTER — Encounter: Payer: Self-pay | Admitting: Physical Therapy

## 2020-04-16 ENCOUNTER — Other Ambulatory Visit: Payer: Self-pay

## 2020-04-16 DIAGNOSIS — M25511 Pain in right shoulder: Secondary | ICD-10-CM

## 2020-04-16 DIAGNOSIS — M25512 Pain in left shoulder: Secondary | ICD-10-CM

## 2020-04-16 DIAGNOSIS — M25552 Pain in left hip: Secondary | ICD-10-CM

## 2020-04-16 DIAGNOSIS — M545 Low back pain, unspecified: Secondary | ICD-10-CM

## 2020-04-16 DIAGNOSIS — M25551 Pain in right hip: Secondary | ICD-10-CM

## 2020-04-16 DIAGNOSIS — M25561 Pain in right knee: Secondary | ICD-10-CM

## 2020-04-16 DIAGNOSIS — G8929 Other chronic pain: Secondary | ICD-10-CM

## 2020-04-16 DIAGNOSIS — M25562 Pain in left knee: Secondary | ICD-10-CM

## 2020-04-16 DIAGNOSIS — M542 Cervicalgia: Secondary | ICD-10-CM

## 2020-04-16 DIAGNOSIS — M6281 Muscle weakness (generalized): Secondary | ICD-10-CM
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
## 2020-04-20 ENCOUNTER — Ambulatory Visit (HOSPITAL_COMMUNITY): Payer: Self-pay | Attending: Cardiovascular Disease

## 2020-04-20 ENCOUNTER — Other Ambulatory Visit: Payer: Self-pay

## 2020-04-20 DIAGNOSIS — R0602 Shortness of breath: Secondary | ICD-10-CM | POA: Insufficient documentation

## 2020-04-20 MED ORDER — PERFLUTREN LIPID MICROSPHERE
1.0000 mL | INTRAVENOUS | Status: AC | PRN
  Administered 2020-04-20: 2 mL via INTRAVENOUS

## 2020-04-23 ENCOUNTER — Encounter: Payer: Self-pay | Admitting: Rehabilitative and Restorative Service Providers"

## 2020-04-30 ENCOUNTER — Encounter (INDEPENDENT_AMBULATORY_CARE_PROVIDER_SITE_OTHER): Payer: Self-pay | Admitting: Primary Care

## 2020-04-30 ENCOUNTER — Other Ambulatory Visit: Payer: Self-pay

## 2020-04-30 ENCOUNTER — Ambulatory Visit (INDEPENDENT_AMBULATORY_CARE_PROVIDER_SITE_OTHER): Payer: Self-pay | Admitting: Primary Care

## 2020-04-30 ENCOUNTER — Ambulatory Visit (INDEPENDENT_AMBULATORY_CARE_PROVIDER_SITE_OTHER): Payer: Self-pay | Admitting: Physical Therapy

## 2020-04-30 VITALS — BP 117/82 | HR 91 | Temp 97.9°F | Ht 60.0 in | Wt 189.0 lb

## 2020-04-30 DIAGNOSIS — M25552 Pain in left hip: Secondary | ICD-10-CM

## 2020-04-30 DIAGNOSIS — M25551 Pain in right hip: Secondary | ICD-10-CM

## 2020-04-30 DIAGNOSIS — Z789 Other specified health status: Secondary | ICD-10-CM

## 2020-04-30 DIAGNOSIS — I10 Essential (primary) hypertension: Secondary | ICD-10-CM

## 2020-04-30 DIAGNOSIS — G8929 Other chronic pain: Secondary | ICD-10-CM

## 2020-04-30 DIAGNOSIS — M25512 Pain in left shoulder: Secondary | ICD-10-CM

## 2020-04-30 DIAGNOSIS — M545 Low back pain: Secondary | ICD-10-CM

## 2020-04-30 DIAGNOSIS — M25511 Pain in right shoulder: Secondary | ICD-10-CM

## 2020-04-30 DIAGNOSIS — Z8616 Personal history of COVID-19: Secondary | ICD-10-CM

## 2020-04-30 DIAGNOSIS — M25561 Pain in right knee: Secondary | ICD-10-CM

## 2020-04-30 DIAGNOSIS — R2689 Other abnormalities of gait and mobility: Secondary | ICD-10-CM

## 2020-04-30 DIAGNOSIS — M6281 Muscle weakness (generalized): Secondary | ICD-10-CM

## 2020-04-30 DIAGNOSIS — M25562 Pain in left knee: Secondary | ICD-10-CM

## 2020-04-30 DIAGNOSIS — M542 Cervicalgia: Secondary | ICD-10-CM
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
## 2020-04-30 NOTE — Progress Notes (Signed)
Pt complains of pain inside neck  Pt complains of pain at his waist  Pt complains of knee pain unable to walk for more than 30 minutes- unable to jump  Pt complains of pain in right hand and fingers

## 2020-05-05 MED FILL — CARVEDILOL 6.25 MG TABLET: 6.25 | 30 days supply | Qty: 60 | Fill #1

## 2020-05-05 MED FILL — tiZANidine HCL 2 MG TABS: 2 | 7 days supply | Qty: 60 | Fill #1

## 2020-05-05 MED FILL — CELECOXIB 200 MG CAPSULE: 200 | 30 days supply | Qty: 60 | Fill #1

## 2020-05-17 ENCOUNTER — Other Ambulatory Visit (INDEPENDENT_AMBULATORY_CARE_PROVIDER_SITE_OTHER): Payer: Self-pay | Admitting: Primary Care

## 2020-05-17 ENCOUNTER — Telehealth (INDEPENDENT_AMBULATORY_CARE_PROVIDER_SITE_OTHER): Payer: Self-pay

## 2020-05-17 DIAGNOSIS — Z789 Other specified health status: Secondary | ICD-10-CM
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
## 2020-05-28 ENCOUNTER — Other Ambulatory Visit: Payer: Self-pay

## 2020-05-28 ENCOUNTER — Ambulatory Visit: Payer: Self-pay

## 2020-05-28 ENCOUNTER — Encounter: Payer: Self-pay | Admitting: Family Medicine

## 2020-05-28 ENCOUNTER — Ambulatory Visit (INDEPENDENT_AMBULATORY_CARE_PROVIDER_SITE_OTHER): Payer: Self-pay | Admitting: Family Medicine

## 2020-05-28 DIAGNOSIS — M25562 Pain in left knee: Secondary | ICD-10-CM

## 2020-05-28 DIAGNOSIS — G8929 Other chronic pain: Secondary | ICD-10-CM

## 2020-05-28 DIAGNOSIS — M25561 Pain in right knee: Secondary | ICD-10-CM
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
## 2020-06-04 ENCOUNTER — Ambulatory Visit: Payer: Self-pay | Admitting: Family Medicine

## 2020-06-08 ENCOUNTER — Ambulatory Visit (INDEPENDENT_AMBULATORY_CARE_PROVIDER_SITE_OTHER): Payer: Self-pay | Admitting: Primary Care

## 2020-06-08 ENCOUNTER — Other Ambulatory Visit: Payer: Self-pay | Admitting: Primary Care

## 2020-06-08 ENCOUNTER — Other Ambulatory Visit: Payer: Self-pay

## 2020-06-08 ENCOUNTER — Encounter (INDEPENDENT_AMBULATORY_CARE_PROVIDER_SITE_OTHER): Payer: Self-pay | Admitting: Primary Care

## 2020-06-08 VITALS — BP 123/89 | HR 82 | Temp 98.3°F | Ht 60.0 in | Wt 188.2 lb

## 2020-06-08 DIAGNOSIS — Z789 Other specified health status: Secondary | ICD-10-CM

## 2020-06-08 DIAGNOSIS — R0602 Shortness of breath: Secondary | ICD-10-CM

## 2020-06-08 DIAGNOSIS — S46812D Strain of other muscles, fascia and tendons at shoulder and upper arm level, left arm, subsequent encounter: Secondary | ICD-10-CM

## 2020-06-08 DIAGNOSIS — M791 Myalgia, unspecified site: Secondary | ICD-10-CM

## 2020-06-08 MED ORDER — ALBUTEROL SULFATE HFA 108 (90 BASE) MCG/ACT IN AERS: 2.0000 | INHALATION_SPRAY | Freq: Four times a day (QID) | RESPIRATORY_TRACT | 2 refills | Status: AC | PRN

## 2020-06-08 MED ORDER — METHOCARBAMOL 500 MG PO TABS: 1000.0000 mg | ORAL_TABLET | Freq: Three times a day (TID) | ORAL | 1 refills | Status: DC | PRN

## 2020-06-08 NOTE — Patient Instructions (Signed)
Calambres y espasmos musculares Muscle Cramps and Spasms Los calambres y espasmos musculares se producen cuando los msculos se tensan por s mismos. Generalmente mejoran en unos minutos. Los calambres musculares son dolorosos. Por lo general, son ms fuertes y duran ms tiempo que los espasmos musculares. Los espasmos musculares pueden o no ser dolorosos. Pueden durar unos segundos o mucho ms tiempo. Los calambres y espasmos puede afectar a cualquier msculo, pero ocurren con mayor frecuencia en los msculos de la pantorrilla. Por lo general, no son provocados por un problema grave. En muchos de los casos, se desconoce la causa. Algunas causas frecuentes son las siguientes:  Hacer ms trabajo fsico o actividad fsica de lo que su cuerpo soporta.  Usar demasiado los msculos (uso excesivo) al repetir determinados movimientos demasiadas veces.  Permanecer en determinada posicin durante un tiempo prolongado.  Practicar un deporte o realizar una actividad sin prepararse adecuadamente.  Usar tcnicas o formas inadecuadas al practicar un deporte o realizar una actividad.  No tener suficiente agua en el cuerpo (deshidratacin).  Lesiones.  Efectos secundarios de algunos medicamentos.  Niveles bajos de sales y minerales en sangre (electrolitos), como por ejemplo, un nivel bajo de potasio o calcio. Siga estas indicaciones en su casa: Control del dolor y de la rigidez      [place], elongue y relaje el msculo. Hgalo durante varios minutos cada vez.  Si se lo indican, aplique calor en los msculos tensos o tirantes con la frecuencia que le haya indicado el mdico. Use la fuente de calor que el mdico le recomiende, como una compresa de calor hmedo o una almohadilla trmica. ? Coloque una toalla entre la piel y la fuente de calor. ? Aplique calor durante 20 a 30minutos. ? Retire la fuente de calor si la piel se pone de color rojo brillante. Esto es muy importante si no puede sentir dolor,  calor o fro. Puede correr un riesgo mayor de sufrir quemaduras.  Si se lo indican, aplique hielo en la zona afectada. Esto puede ayudar si despus de un calambre o espasmo tiene dolor o sensibilidad. ? Ponga el hielo en una bolsa plstica. ? Coloque una toalla entre la piel y la bolsa. ? Coloque el hielo durante 20minutos, 2 a 3veces por da.  Intente tomar duchas o baos con agua caliente para ayudar a relajar los msculos tirantes. Comida y bebida  Beba suficiente lquido para mantener la orina de color amarillo plido.  Consuma una dieta sana para asegurarse de que los msculos funcionen bien. Esto debe incluir lo siguiente: ? Frutas y vegetales. ? Protenas magras. ? Cereales integrales. ? Productos lcteos descremados o con bajo contenido de grasa. Indicaciones generales  Si tiene calambres con frecuencia, evite el ejercicio intenso durante varios das.  Tome los medicamentos de venta libre y los recetados solamente como se lo haya indicado el mdico.  Controle si hay algn cambio en sus sntomas.  Concurra a todas las visitas de seguimiento como se lo haya indicado el mdico. Esto es importante. Comunquese con un mdico si:  Sus calambres o espasmos empeoran u ocurren con ms frecuencia.  Sus calambres o espasmos no mejoran con el tiempo. Resumen  Los calambres y espasmos musculares se producen cuando los msculos se tensan por s mismos. Generalmente mejoran en unos minutos.  Los calambres y espasmos ocurren con mayor frecuencia en los msculos de la pantorrilla.  [place], elongue y relaje el msculo. Esto puede ayudar a que el calambre o espasmo desaparezca.  Beba suficiente lquido   para mantener la orina de color amarillo plido. Esta informacin no tiene Theme park manager el consejo del mdico. Asegrese de hacerle al mdico cualquier pregunta que tenga. Document Revised: 05/30/2018 Document Reviewed: 05/30/2018 Elsevier Patient Education  2020 [employer].
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
## 2020-06-09 MED FILL — METHOCARBAMOL 500 MG TABS: 500 | 10 days supply | Qty: 60 | Fill #0

## 2020-06-09 MED FILL — CELECOXIB 200 MG CAPSULE: 200 | 30 days supply | Qty: 60 | Fill #2

## 2020-06-09 MED FILL — CARVEDILOL 6.25 MG TABLET: 6.25 | 30 days supply | Qty: 60 | Fill #2

## 2020-06-09 MED FILL — ALBUTEROL SULFATE HFA 108 (: 108 (90 BAS | 25 days supply | Qty: 18 | Fill #0

## 2020-06-15 ENCOUNTER — Telehealth: Payer: Self-pay | Admitting: Primary Care

## 2020-06-15 NOTE — Telephone Encounter (Signed)
Please place referral for patient if appropriate.

## 2020-06-15 NOTE — Telephone Encounter (Signed)
Please f/u   Copied from CRM [id number]. Topic: Referral - Request for Referral >> Jun 15, 2020  1:14 PM Leafy Ro wrote: Has patient seen PCP for this complaint? Yes. Pt saw michelle on 06/08/2020 and would like a referral for physical therapy on his neck.

## 2020-06-15 NOTE — Telephone Encounter (Signed)
Notes indicate Dr. Prince Rome ortho is following for his neck CAN [facility] send them that message.

## 2020-06-16 ENCOUNTER — Other Ambulatory Visit (INDEPENDENT_AMBULATORY_CARE_PROVIDER_SITE_OTHER): Payer: Self-pay | Admitting: Primary Care

## 2020-06-16 DIAGNOSIS — M542 Cervicalgia: Secondary | ICD-10-CM

## 2020-06-16 NOTE — Telephone Encounter (Signed)
[facility] is not going to send them a message. Per patient during OV he is not being seen anywhere for neck pain.

## 2020-06-18 ENCOUNTER — Other Ambulatory Visit: Payer: Self-pay

## 2020-06-18 ENCOUNTER — Encounter: Payer: Self-pay | Admitting: Physical Therapy

## 2020-06-18 ENCOUNTER — Ambulatory Visit (INDEPENDENT_AMBULATORY_CARE_PROVIDER_SITE_OTHER): Payer: Self-pay | Admitting: Physical Therapy

## 2020-06-18 DIAGNOSIS — G8929 Other chronic pain: Secondary | ICD-10-CM

## 2020-06-18 DIAGNOSIS — M25562 Pain in left knee: Secondary | ICD-10-CM

## 2020-06-18 DIAGNOSIS — M542 Cervicalgia: Secondary | ICD-10-CM

## 2020-06-18 NOTE — Therapy (Signed)
[facility] [address] [place], [place], [address] Phone: [phone]   Fax:  [phone]  Patient Details  Name: John Byrd MRN: [id number] Date of Birth: [birth year] Referring Provider:  Gwinda Passe, NP  Encounter Date: 06/18/2020   Pt arrived to PT eval with referral for neck pain.  Pt reports no neck pain and c/o continued Lt knee pain, which he has already been seen at this clinic for prior to today.  Sports Medicine notes report recent injection with plan to follow up with imaging if injection is not beneficial.  Recommended pt schedule follow up with sports medicine MD given continued knee pain and will see here for knee pain if appropriate and new referral is made.  Pt c/o Lt calf tightness.  No swelling, redness or increased temperature noted and mild pain with palpation. Recommended he follow up with PCP if symptoms persist.     Clarita Crane, PT, DPT 06/18/20 8:26 AM    [facility] [address] [place], [place], [address] Phone: [phone]   Fax:  [phone]

## 2020-07-02 ENCOUNTER — Ambulatory Visit (INDEPENDENT_AMBULATORY_CARE_PROVIDER_SITE_OTHER): Payer: Self-pay

## 2020-07-02 ENCOUNTER — Ambulatory Visit: Payer: Self-pay | Admitting: Family Medicine

## 2020-07-02 ENCOUNTER — Encounter: Payer: Self-pay | Admitting: Family Medicine

## 2020-07-02 ENCOUNTER — Ambulatory Visit: Payer: Self-pay

## 2020-07-02 ENCOUNTER — Ambulatory Visit (INDEPENDENT_AMBULATORY_CARE_PROVIDER_SITE_OTHER): Payer: Self-pay | Admitting: Family Medicine

## 2020-07-02 ENCOUNTER — Other Ambulatory Visit: Payer: Self-pay

## 2020-07-02 DIAGNOSIS — R200 Anesthesia of skin: Secondary | ICD-10-CM

## 2020-07-02 DIAGNOSIS — M1712 Unilateral primary osteoarthritis, left knee: Secondary | ICD-10-CM

## 2020-07-02 DIAGNOSIS — R109 Unspecified abdominal pain: Secondary | ICD-10-CM

## 2020-07-02 DIAGNOSIS — G8929 Other chronic pain: Secondary | ICD-10-CM

## 2020-07-02 DIAGNOSIS — M1711 Unilateral primary osteoarthritis, right knee: Secondary | ICD-10-CM

## 2020-07-02 DIAGNOSIS — M25562 Pain in left knee: Secondary | ICD-10-CM

## 2020-07-02 DIAGNOSIS — M25561 Pain in right knee: Secondary | ICD-10-CM
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
## 2020-07-02 NOTE — Patient Instructions (Signed)
    Pega esta almohadilla al interior del zapato con Irish Elders. Colquelo justo detrs de la parte dolorida del pie. No lo coloque directamente debajo de la parte dolorida.

## 2020-07-05 ENCOUNTER — Telehealth: Payer: Self-pay

## 2020-07-05 NOTE — Telephone Encounter (Signed)
Pt is self pay. Can try [employer] assistance program

## 2020-07-05 NOTE — Telephone Encounter (Signed)
John Byrd lvm in spanish for pt to call back to discuss

## 2020-07-05 NOTE — Telephone Encounter (Signed)
Called patient regarding Gel inj-he is self pay. We can do the [employer] forms to see if he can get approved. No answer. LMOM to return our call.

## 2020-07-05 NOTE — Telephone Encounter (Signed)
-----   Message from April Jackson, [place] sent at 07/02/2020  4:54 PM EDT -----  ----- Message ----- From: Lavada Mesi, MD Sent: 07/02/2020   4:50 PM EDT To: April Jackson, RMA  Please request approval for bilateral knee gel injections for OA.

## 2020-07-06 NOTE — Telephone Encounter (Signed)
Paperwork mailed to the address in chart

## 2020-07-06 NOTE — Telephone Encounter (Signed)
Spoke to patient. He agrees and will fill out paperwork. Autumn H is aware and will mail forms to him. He knows he will need to bring forms back to our office or mail them back. Then someone will call him to see if he has been approved or denied.

## 2020-07-23 ENCOUNTER — Ambulatory Visit (INDEPENDENT_AMBULATORY_CARE_PROVIDER_SITE_OTHER): Payer: Self-pay | Admitting: Physical Therapy

## 2020-07-23 ENCOUNTER — Encounter: Payer: Self-pay | Admitting: Physical Therapy

## 2020-07-23 ENCOUNTER — Other Ambulatory Visit: Payer: Self-pay

## 2020-07-23 DIAGNOSIS — M25552 Pain in left hip: Secondary | ICD-10-CM

## 2020-07-23 DIAGNOSIS — M545 Low back pain: Secondary | ICD-10-CM

## 2020-07-23 DIAGNOSIS — G8929 Other chronic pain: Secondary | ICD-10-CM

## 2020-07-23 DIAGNOSIS — M6281 Muscle weakness (generalized): Secondary | ICD-10-CM

## 2020-07-23 NOTE — Patient Instructions (Signed)
Access Code: [email or web address] URL: [email or web address] Date: 07/23/2020 Prepared by: Ivery Quale  Exercises Hooklying Single Knee to Chest Stretch - 2 x daily - 7 x weekly - 2 reps - 15 seconds hold Supine Lower Trunk Rotation - 2 x daily - 7 x weekly - 10 reps Supine Bridge - 2 x daily - 7 x weekly - 2 sets - 10 reps Sidelying Hip Abduction - 2 x daily - 7 x weekly - 2 sets - 10 reps Dead Bug - 2 x daily - 6 x weekly - 3 sets - 10 reps Standing Shoulder Extension with Resistance - 2 x daily - 6 x weekly - 3 sets - 10 reps Banded Row - 2 x daily - 7 x weekly - 2 sets - 10 reps
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
## 2020-07-27 ENCOUNTER — Telehealth: Payer: Self-pay | Admitting: Family Medicine

## 2020-07-27 NOTE — Telephone Encounter (Signed)
Pt would like to have a refill of his medicine called in and a CB when it's ready for pick up.   [phone]

## 2020-07-27 NOTE — Telephone Encounter (Signed)
Left a voice mail for patient to call back, or have the pharmacy contact us ---- which medication is he requesting?

## 2020-07-29 NOTE — Telephone Encounter (Signed)
Would you have time to call this patient and ask him what, specifically, he needs?

## 2020-07-30 ENCOUNTER — Other Ambulatory Visit: Payer: Self-pay

## 2020-07-30 ENCOUNTER — Ambulatory Visit (INDEPENDENT_AMBULATORY_CARE_PROVIDER_SITE_OTHER): Payer: Self-pay | Admitting: Physical Therapy

## 2020-07-30 DIAGNOSIS — M25551 Pain in right hip: Secondary | ICD-10-CM

## 2020-07-30 DIAGNOSIS — M25562 Pain in left knee: Secondary | ICD-10-CM

## 2020-07-30 DIAGNOSIS — M25561 Pain in right knee: Secondary | ICD-10-CM

## 2020-07-30 DIAGNOSIS — M545 Low back pain, unspecified: Secondary | ICD-10-CM

## 2020-07-30 DIAGNOSIS — M6281 Muscle weakness (generalized): Secondary | ICD-10-CM

## 2020-07-30 DIAGNOSIS — R2689 Other abnormalities of gait and mobility: Secondary | ICD-10-CM

## 2020-07-30 DIAGNOSIS — G8929 Other chronic pain: Secondary | ICD-10-CM

## 2020-07-30 DIAGNOSIS — M25552 Pain in left hip: Secondary | ICD-10-CM

## 2020-07-30 NOTE — Telephone Encounter (Signed)
FYI  Called patient no answer LMOM. He need to return our call to see which Rx he is referring to. Let me know if he calls back.
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
## 2020-08-02 ENCOUNTER — Other Ambulatory Visit: Payer: Self-pay | Admitting: Family Medicine

## 2020-08-02 MED FILL — ALBUTEROL SULFATE HFA 108 (: 108 (90 BAS | 25 days supply | Qty: 18 | Fill #1

## 2020-08-02 MED FILL — CELECOXIB 200 MG CAP: 200 | 30 days supply | Qty: 60 | Fill #3

## 2020-08-02 MED FILL — tiZANidine HCL 2 MG TABS: 2 | 7 days supply | Qty: 60 | Fill #0

## 2020-08-02 MED FILL — CARVEDILOL 6.25 MG TABLET: 6.25 | 30 days supply | Qty: 60 | Fill #3

## 2020-08-02 MED FILL — METHOCARBAMOL 500 MG TABS: 500 | 10 days supply | Qty: 60 | Fill #1

## 2020-08-02 NOTE — Telephone Encounter (Signed)
Called patient again. States he does not know the name  and I advised him that we need a name of Rx to see if Dr will approve. He will call us back with the name.

## 2020-08-06 ENCOUNTER — Ambulatory Visit (INDEPENDENT_AMBULATORY_CARE_PROVIDER_SITE_OTHER): Payer: Self-pay | Admitting: Physical Therapy

## 2020-08-06 ENCOUNTER — Encounter: Payer: Self-pay | Admitting: Physical Therapy

## 2020-08-06 ENCOUNTER — Other Ambulatory Visit: Payer: Self-pay

## 2020-08-06 DIAGNOSIS — M25562 Pain in left knee: Secondary | ICD-10-CM

## 2020-08-06 DIAGNOSIS — M6281 Muscle weakness (generalized): Secondary | ICD-10-CM

## 2020-08-06 DIAGNOSIS — G8929 Other chronic pain: Secondary | ICD-10-CM

## 2020-08-06 DIAGNOSIS — R2689 Other abnormalities of gait and mobility: Secondary | ICD-10-CM

## 2020-08-06 DIAGNOSIS — M25561 Pain in right knee: Secondary | ICD-10-CM

## 2020-08-06 DIAGNOSIS — M25552 Pain in left hip: Secondary | ICD-10-CM

## 2020-08-06 DIAGNOSIS — M25551 Pain in right hip: Secondary | ICD-10-CM

## 2020-08-06 DIAGNOSIS — M545 Low back pain, unspecified: Secondary | ICD-10-CM
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
## 2020-08-11 ENCOUNTER — Encounter: Payer: Self-pay | Admitting: Physical Therapy

## 2020-08-11 ENCOUNTER — Ambulatory Visit (INDEPENDENT_AMBULATORY_CARE_PROVIDER_SITE_OTHER): Payer: Self-pay | Admitting: Physical Therapy

## 2020-08-11 DIAGNOSIS — M545 Low back pain: Secondary | ICD-10-CM

## 2020-08-11 DIAGNOSIS — G8929 Other chronic pain: Secondary | ICD-10-CM

## 2020-08-11 DIAGNOSIS — M6281 Muscle weakness (generalized): Secondary | ICD-10-CM

## 2020-08-11 DIAGNOSIS — M25552 Pain in left hip: Secondary | ICD-10-CM
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
## 2020-08-13 ENCOUNTER — Telehealth: Payer: Self-pay | Admitting: Family Medicine

## 2020-08-13 ENCOUNTER — Other Ambulatory Visit: Payer: Self-pay

## 2020-08-13 ENCOUNTER — Ambulatory Visit
Admission: RE | Admit: 2020-08-13 | Discharge: 2020-08-13 | Disposition: A | Discharge status: Home / Self Care | Payer: Self-pay | Source: Ambulatory Visit | Attending: Family Medicine | Admitting: Family Medicine

## 2020-08-13 DIAGNOSIS — M25562 Pain in left knee: Secondary | ICD-10-CM

## 2020-08-13 DIAGNOSIS — M25561 Pain in right knee: Secondary | ICD-10-CM

## 2020-08-13 NOTE — Telephone Encounter (Signed)
Left knee MRI looks just like the right, with severe arthritis.  Same recommendations as for the right knee.

## 2020-08-13 NOTE — Telephone Encounter (Signed)
MRI shows severe arthritis in the knee.  Unfortunately, arthroscopic surgery would not be an option for him.  If he were to choose surgery, it would be a knee replacement.  If he wants to keep trying non-surgical options, then we will try the unloading brace that was discussed last visit.  And we could get approval for gel injections (I thought we had requested that last time).

## 2020-08-16 NOTE — Telephone Encounter (Signed)
I mailed it to him. Would it be better for him to come to the office and fill it out in the office?

## 2020-08-16 NOTE — Telephone Encounter (Signed)
John Byrd, Patient aware. Would like to try Gel injections first.   States he does not have any paperwork. John or John Byrd, Can you please send him paper work ([employer] assistance program).

## 2020-08-16 NOTE — Telephone Encounter (Signed)
No he wanted it mailed.

## 2020-08-16 NOTE — Telephone Encounter (Signed)
This is the 2nd part of the message from Dr. Prince Rome.

## 2020-08-16 NOTE — Telephone Encounter (Signed)
Will you please call this gentleman? There are 2 messages on him, one for results of each knee. Autumn H sent the paperwork to the patient regarding gel injections (for patient assistance through [employer]).  You may need to remind him of this. Thanks!

## 2020-08-18 NOTE — Telephone Encounter (Signed)
April do you mind mailing this out?

## 2020-08-18 NOTE — Telephone Encounter (Signed)
Noted. Mailed [employer] patient assistance application to addressed listed in chart for Monovisc, bilateral knee.

## 2020-08-20 ENCOUNTER — Ambulatory Visit (INDEPENDENT_AMBULATORY_CARE_PROVIDER_SITE_OTHER): Payer: Self-pay | Admitting: Physical Therapy

## 2020-08-20 ENCOUNTER — Other Ambulatory Visit: Payer: Self-pay

## 2020-08-20 DIAGNOSIS — M545 Low back pain, unspecified: Secondary | ICD-10-CM

## 2020-08-20 DIAGNOSIS — M25552 Pain in left hip: Secondary | ICD-10-CM

## 2020-08-20 DIAGNOSIS — M6281 Muscle weakness (generalized): Secondary | ICD-10-CM

## 2020-08-20 DIAGNOSIS — G8929 Other chronic pain: Secondary | ICD-10-CM

## 2020-08-20 DIAGNOSIS — M25551 Pain in right hip: Secondary | ICD-10-CM

## 2020-08-20 DIAGNOSIS — R2689 Other abnormalities of gait and mobility: Secondary | ICD-10-CM

## 2020-08-20 DIAGNOSIS — M25561 Pain in right knee: Secondary | ICD-10-CM

## 2020-08-20 DIAGNOSIS — M25562 Pain in left knee: Secondary | ICD-10-CM
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
## 2020-08-27 ENCOUNTER — Other Ambulatory Visit: Payer: Self-pay

## 2020-08-27 ENCOUNTER — Ambulatory Visit (INDEPENDENT_AMBULATORY_CARE_PROVIDER_SITE_OTHER): Payer: Self-pay | Admitting: Physical Therapy

## 2020-08-27 DIAGNOSIS — M545 Low back pain, unspecified: Secondary | ICD-10-CM

## 2020-08-27 DIAGNOSIS — G8929 Other chronic pain: Secondary | ICD-10-CM

## 2020-08-27 DIAGNOSIS — M25552 Pain in left hip: Secondary | ICD-10-CM

## 2020-08-27 DIAGNOSIS — M25551 Pain in right hip: Secondary | ICD-10-CM

## 2020-08-27 DIAGNOSIS — M6281 Muscle weakness (generalized): Secondary | ICD-10-CM
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
## 2020-08-30 ENCOUNTER — Encounter: Payer: Self-pay | Admitting: Family Medicine

## 2020-08-30 ENCOUNTER — Other Ambulatory Visit: Payer: Self-pay

## 2020-08-30 ENCOUNTER — Ambulatory Visit (INDEPENDENT_AMBULATORY_CARE_PROVIDER_SITE_OTHER): Payer: Self-pay | Admitting: Family Medicine

## 2020-08-30 DIAGNOSIS — M25561 Pain in right knee: Secondary | ICD-10-CM

## 2020-08-30 DIAGNOSIS — M25562 Pain in left knee: Secondary | ICD-10-CM

## 2020-08-30 DIAGNOSIS — G8929 Other chronic pain: Secondary | ICD-10-CM

## 2020-08-30 NOTE — Progress Notes (Signed)
Here for gel injections, but we haven't received approval yet.

## 2020-09-03 ENCOUNTER — Ambulatory Visit (INDEPENDENT_AMBULATORY_CARE_PROVIDER_SITE_OTHER): Payer: Self-pay | Admitting: Rehabilitative and Restorative Service Providers"

## 2020-09-03 ENCOUNTER — Encounter: Payer: Self-pay | Admitting: Rehabilitative and Restorative Service Providers"

## 2020-09-03 ENCOUNTER — Other Ambulatory Visit: Payer: Self-pay

## 2020-09-03 DIAGNOSIS — M545 Low back pain, unspecified: Secondary | ICD-10-CM

## 2020-09-03 DIAGNOSIS — R2689 Other abnormalities of gait and mobility: Secondary | ICD-10-CM

## 2020-09-03 DIAGNOSIS — G8929 Other chronic pain: Secondary | ICD-10-CM

## 2020-09-03 DIAGNOSIS — M6281 Muscle weakness (generalized): Secondary | ICD-10-CM

## 2020-09-03 DIAGNOSIS — R293 Abnormal posture: Secondary | ICD-10-CM

## 2020-09-03 NOTE — Patient Instructions (Signed)
Access Code: [email or web address] URL: [email or web address] Date: 09/03/2020 Prepared by: Pauletta Browns  Exercises Standing Lumbar Extension at Wall - Forearms - 5 x daily - 7 x weekly - 1 sets - 5 reps - 3 seconds hold Prone Hip Extension - 1 x daily - 7 x weekly - 2-3 sets - 10 reps - 3 seconds hold Sidelying Hip Extension in Abduction - 1 x daily - 7 x weekly - 2-3 sets - 10 reps - 3 seconds hold
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
## 2020-09-10 ENCOUNTER — Other Ambulatory Visit: Payer: Self-pay

## 2020-09-10 ENCOUNTER — Encounter: Payer: Self-pay | Admitting: Physical Therapy

## 2020-09-10 ENCOUNTER — Ambulatory Visit (INDEPENDENT_AMBULATORY_CARE_PROVIDER_SITE_OTHER): Payer: Self-pay | Admitting: Physical Therapy

## 2020-09-10 DIAGNOSIS — R2689 Other abnormalities of gait and mobility: Secondary | ICD-10-CM

## 2020-09-10 DIAGNOSIS — R293 Abnormal posture: Secondary | ICD-10-CM

## 2020-09-10 DIAGNOSIS — G8929 Other chronic pain: Secondary | ICD-10-CM

## 2020-09-10 DIAGNOSIS — M545 Low back pain, unspecified: Secondary | ICD-10-CM

## 2020-09-10 DIAGNOSIS — M6281 Muscle weakness (generalized): Secondary | ICD-10-CM
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
## 2020-09-22 ENCOUNTER — Other Ambulatory Visit: Payer: Self-pay

## 2020-09-22 ENCOUNTER — Encounter: Payer: Self-pay | Admitting: Physical Therapy

## 2020-09-22 ENCOUNTER — Ambulatory Visit (INDEPENDENT_AMBULATORY_CARE_PROVIDER_SITE_OTHER): Payer: Self-pay | Admitting: Physical Therapy

## 2020-09-22 DIAGNOSIS — M6281 Muscle weakness (generalized): Secondary | ICD-10-CM

## 2020-09-22 DIAGNOSIS — M545 Low back pain, unspecified: Secondary | ICD-10-CM

## 2020-09-22 DIAGNOSIS — R2689 Other abnormalities of gait and mobility: Secondary | ICD-10-CM

## 2020-09-22 DIAGNOSIS — G8929 Other chronic pain: Secondary | ICD-10-CM

## 2020-09-22 DIAGNOSIS — R293 Abnormal posture: Secondary | ICD-10-CM
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
## 2020-09-29 MED FILL — tiZANidine HCL 2 MG TABS: 2 | 7 days supply | Qty: 60 | Fill #1

## 2020-09-29 MED FILL — CELECOXIB 200 MG CAP: 200 | 30 days supply | Qty: 60 | Fill #4

## 2020-09-29 MED FILL — CARVEDILOL 6.25 MG TABLET: 6.25 | 30 days supply | Qty: 60 | Fill #4

## 2020-10-01 ENCOUNTER — Telehealth: Payer: Self-pay

## 2020-10-01 ENCOUNTER — Ambulatory Visit: Payer: Self-pay | Admitting: Family Medicine

## 2020-10-01 NOTE — Telephone Encounter (Signed)
Called [employer] to check the status of pt's application. Per [employer] rep we will know a status in the next 24 to 48 hours.

## 2020-10-05 NOTE — Telephone Encounter (Signed)
Have you received anything back on this pt from [employer]?

## 2020-10-06 NOTE — Telephone Encounter (Signed)
I haven't received [employer] application from patient.  It was mailed around 08/18/2020.

## 2020-10-08 ENCOUNTER — Encounter (INDEPENDENT_AMBULATORY_CARE_PROVIDER_SITE_OTHER): Payer: Self-pay | Admitting: Primary Care

## 2020-10-08 ENCOUNTER — Other Ambulatory Visit: Payer: Self-pay

## 2020-10-08 ENCOUNTER — Ambulatory Visit (INDEPENDENT_AMBULATORY_CARE_PROVIDER_SITE_OTHER): Payer: Self-pay | Admitting: Primary Care

## 2020-10-08 VITALS — BP 138/96 | HR 67 | Resp 16 | Ht 60.5 in | Wt 193.0 lb

## 2020-10-08 DIAGNOSIS — I10 Essential (primary) hypertension: Secondary | ICD-10-CM

## 2020-10-08 DIAGNOSIS — Z125 Encounter for screening for malignant neoplasm of prostate: Secondary | ICD-10-CM

## 2020-10-08 DIAGNOSIS — R7303 Prediabetes: Secondary | ICD-10-CM

## 2020-10-08 DIAGNOSIS — Z13220 Encounter for screening for lipoid disorders: Secondary | ICD-10-CM

## 2020-10-08 NOTE — Progress Notes (Signed)
Pain in left hip, leg, and neck. Medications helps minimally.  Discomfort in chest with cough.

## 2020-10-09 LAB — LIPID PANEL
Chol/HDL Ratio: 4.6 ratio (ref 0.0–5.0)
Cholesterol, Total: 187 mg/dL (ref 100–199)
HDL: 41 mg/dL (ref >39)
LDL Chol Calc (NIH): 117 mg/dL — ABNORMAL HIGH (ref 0–99)
Triglycerides: 163 mg/dL — ABNORMAL HIGH (ref 0–149)
VLDL Cholesterol Cal: 29 mg/dL (ref 5–40)

## 2020-10-09 LAB — CBC WITH DIFFERENTIAL/PLATELET
Basophils Absolute: 0.1 10*3/uL (ref 0.0–0.2)
Basos: 1 %
EOS (ABSOLUTE): 0.3 10*3/uL (ref 0.0–0.4)
Eos: 3 %
Hematocrit: 46.1 % (ref 37.5–51.0)
Hemoglobin: 15.9 g/dL (ref 13.0–17.7)
Immature Grans (Abs): 0.1 10*3/uL (ref 0.0–0.1)
Immature Granulocytes: 1 %
Lymphocytes Absolute: 2.9 10*3/uL (ref 0.7–3.1)
Lymphs: 34 %
MCH: 31.2 pg (ref 26.6–33.0)
MCHC: 34.5 g/dL (ref 31.5–35.7)
MCV: 90 fL (ref 79–97)
Monocytes Absolute: 0.6 10*3/uL (ref 0.1–0.9)
Monocytes: 8 %
Neutrophils Absolute: 4.5 10*3/uL (ref 1.4–7.0)
Neutrophils: 53 %
Platelets: 279 10*3/uL (ref 150–450)
RBC: 5.1 x10E6/uL (ref 4.14–5.80)
RDW: 12.2 % (ref 11.6–15.4)
WBC: 8.4 10*3/uL (ref 3.4–10.8)

## 2020-10-09 LAB — PSA: Prostate Specific Ag, Serum: 2 ng/mL (ref 0.0–4.0)
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
## 2020-10-14 ENCOUNTER — Other Ambulatory Visit: Payer: Self-pay | Admitting: Primary Care

## 2020-10-14 ENCOUNTER — Other Ambulatory Visit (INDEPENDENT_AMBULATORY_CARE_PROVIDER_SITE_OTHER): Payer: Self-pay | Admitting: Primary Care

## 2020-10-14 MED ORDER — ATORVASTATIN CALCIUM 20 MG PO TABS: 20.0000 mg | ORAL_TABLET | Freq: Every day | ORAL | 3 refills | Status: DC

## 2020-10-14 MED FILL — ATORVASTATIN CALCIUM 20 MG: 20 | 30 days supply | Qty: 30 | Fill #0

## 2020-10-15 ENCOUNTER — Encounter: Payer: Self-pay | Admitting: Cardiology

## 2020-10-15 ENCOUNTER — Other Ambulatory Visit: Payer: Self-pay

## 2020-10-15 ENCOUNTER — Ambulatory Visit (INDEPENDENT_AMBULATORY_CARE_PROVIDER_SITE_OTHER): Payer: Self-pay | Admitting: Cardiology

## 2020-10-15 VITALS — BP 98/74 | HR 67 | Ht 60.5 in | Wt 193.0 lb

## 2020-10-15 DIAGNOSIS — Z952 Presence of prosthetic heart valve: Secondary | ICD-10-CM

## 2020-10-15 DIAGNOSIS — I10 Essential (primary) hypertension: Secondary | ICD-10-CM

## 2020-10-15 NOTE — Patient Instructions (Signed)

## 2020-10-16 ENCOUNTER — Encounter (INDEPENDENT_AMBULATORY_CARE_PROVIDER_SITE_OTHER): Payer: Self-pay | Admitting: Primary Care
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
## 2020-10-18 MED FILL — ALBUTEROL SULFATE HFA 108 (: 108 (90 BAS | 25 days supply | Qty: 18 | Fill #2

## 2020-10-22 ENCOUNTER — Telehealth (INDEPENDENT_AMBULATORY_CARE_PROVIDER_SITE_OTHER): Payer: Self-pay

## 2020-10-22 NOTE — Telephone Encounter (Signed)
Call placed to patient using pacific interpreter [phone]) patient verified date of birth. He is aware that labs are normal except elevated cholesterol. Encouraged patient to decrease fatty foods in diet and take atorvastatin nightly at bedtime. He verbalized understanding. Maryjean Morn, CMA

## 2020-10-22 NOTE — Telephone Encounter (Signed)
-----   Message from Grayce Sessions, NP sent at 10/14/2020  4:24 PM EST ----- Labs are normal except elevated cholesterol encouraged to decrease fatty foods sent in atorvastatin 20 mg to take at bedtime

## 2020-11-05 ENCOUNTER — Ambulatory Visit (INDEPENDENT_AMBULATORY_CARE_PROVIDER_SITE_OTHER): Payer: Self-pay | Admitting: Physical Therapy

## 2020-11-05 ENCOUNTER — Encounter: Payer: Self-pay | Admitting: Physical Therapy

## 2020-11-05 ENCOUNTER — Other Ambulatory Visit: Payer: Self-pay

## 2020-11-05 DIAGNOSIS — M6281 Muscle weakness (generalized): Secondary | ICD-10-CM

## 2020-11-05 DIAGNOSIS — R2689 Other abnormalities of gait and mobility: Secondary | ICD-10-CM

## 2020-11-05 DIAGNOSIS — M25552 Pain in left hip: Secondary | ICD-10-CM

## 2020-11-05 DIAGNOSIS — G8929 Other chronic pain: Secondary | ICD-10-CM

## 2020-11-05 DIAGNOSIS — M25561 Pain in right knee: Secondary | ICD-10-CM

## 2020-11-05 DIAGNOSIS — M542 Cervicalgia: Secondary | ICD-10-CM

## 2020-11-05 DIAGNOSIS — M25562 Pain in left knee: Secondary | ICD-10-CM

## 2020-11-05 DIAGNOSIS — M25551 Pain in right hip: Secondary | ICD-10-CM

## 2020-11-05 DIAGNOSIS — R293 Abnormal posture: Secondary | ICD-10-CM

## 2020-11-05 DIAGNOSIS — M545 Low back pain, unspecified: Secondary | ICD-10-CM
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
## 2020-11-09 ENCOUNTER — Other Ambulatory Visit: Payer: Self-pay | Admitting: Family Medicine

## 2020-11-09 MED FILL — ATORVASTATIN CALCIUM 20 MG: 20 | 30 days supply | Qty: 30 | Fill #1

## 2020-11-09 MED FILL — CELECOXIB 200 MG CAP: 200 | 30 days supply | Qty: 60 | Fill #5

## 2020-11-09 MED FILL — CARVEDILOL 6.25 MG TABLET: 6.25 | 30 days supply | Qty: 60 | Fill #5

## 2020-11-09 NOTE — Telephone Encounter (Signed)
FYI  I spoke with [name] with [employer] and she stated she was going to expedite the application and send a message to her supervisor since it has been so long since we submitted this. Hopefully we will hear something back soon.

## 2020-11-09 NOTE — Telephone Encounter (Signed)
Noted  

## 2020-11-10 MED FILL — tiZANidine HCL 2 MG TABS: 2 | 15 days supply | Qty: 60 | Fill #0

## 2020-11-11 ENCOUNTER — Telehealth: Payer: Self-pay | Admitting: Family Medicine

## 2020-11-11 DIAGNOSIS — M1711 Unilateral primary osteoarthritis, right knee: Secondary | ICD-10-CM

## 2020-11-11 DIAGNOSIS — M1712 Unilateral primary osteoarthritis, left knee: Secondary | ICD-10-CM

## 2020-11-11 DIAGNOSIS — M542 Cervicalgia: Secondary | ICD-10-CM

## 2020-11-11 DIAGNOSIS — Z8781 Personal history of (healed) traumatic fracture: Secondary | ICD-10-CM

## 2020-11-11 NOTE — Telephone Encounter (Signed)
I spoke to patient's attorney for 5 minutes this morning.  He will be sending additional medical records to me for review.    In addition, I will order a functional capacity evaluation related to patient's orthopedic injuries.

## 2020-11-17 MED FILL — tiZANidine HCL 2 MG TABS: 2 | 10 days supply | Qty: 60 | Fill #0

## 2020-11-23 ENCOUNTER — Telehealth: Payer: Self-pay | Admitting: Family Medicine

## 2020-11-23 DIAGNOSIS — M1712 Unilateral primary osteoarthritis, left knee: Secondary | ICD-10-CM

## 2020-11-23 DIAGNOSIS — M1711 Unilateral primary osteoarthritis, right knee: Secondary | ICD-10-CM
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
## 2020-12-02 ENCOUNTER — Other Ambulatory Visit: Payer: Self-pay | Admitting: Family Medicine

## 2020-12-02 DIAGNOSIS — M1712 Unilateral primary osteoarthritis, left knee: Secondary | ICD-10-CM

## 2020-12-02 DIAGNOSIS — M542 Cervicalgia: Secondary | ICD-10-CM

## 2020-12-02 DIAGNOSIS — M1711 Unilateral primary osteoarthritis, right knee: Secondary | ICD-10-CM

## 2020-12-02 DIAGNOSIS — Z8781 Personal history of (healed) traumatic fracture: Secondary | ICD-10-CM

## 2020-12-16 ENCOUNTER — Other Ambulatory Visit: Payer: Self-pay | Admitting: Primary Care

## 2020-12-16 ENCOUNTER — Other Ambulatory Visit: Payer: Self-pay

## 2020-12-16 ENCOUNTER — Ambulatory Visit (INDEPENDENT_AMBULATORY_CARE_PROVIDER_SITE_OTHER): Payer: Self-pay | Admitting: Primary Care

## 2020-12-16 DIAGNOSIS — L738 Other specified follicular disorders: Secondary | ICD-10-CM

## 2020-12-16 DIAGNOSIS — M542 Cervicalgia: Secondary | ICD-10-CM

## 2020-12-16 DIAGNOSIS — N8300 Follicular cyst of ovary, unspecified side: Secondary | ICD-10-CM

## 2020-12-16 MED ORDER — CLINDAMYCIN PHOSPHATE 1 % EX LOTN: TOPICAL_LOTION | Freq: Two times a day (BID) | CUTANEOUS | 0 refills | Status: DC

## 2020-12-16 MED FILL — CLINDAMYCIN PHOSP 1% LOTION: 1 | 14 days supply | Qty: 60 | Fill #0
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
## 2020-12-17 MED FILL — METHOCARBAMOL 500 MG TABS: 500 | 20 days supply | Qty: 60 | Fill #1

## 2020-12-21 ENCOUNTER — Ambulatory Visit: Payer: Self-pay

## 2020-12-23 ENCOUNTER — Ambulatory Visit: Payer: Self-pay

## 2020-12-31 ENCOUNTER — Telehealth: Payer: Self-pay | Admitting: Family Medicine

## 2020-12-31 ENCOUNTER — Other Ambulatory Visit: Payer: Self-pay

## 2020-12-31 ENCOUNTER — Ambulatory Visit: Payer: Self-pay | Attending: Family Medicine

## 2020-12-31 DIAGNOSIS — M25562 Pain in left knee: Secondary | ICD-10-CM | POA: Insufficient documentation

## 2020-12-31 DIAGNOSIS — G8929 Other chronic pain: Secondary | ICD-10-CM | POA: Insufficient documentation

## 2020-12-31 DIAGNOSIS — M25561 Pain in right knee: Secondary | ICD-10-CM | POA: Insufficient documentation

## 2020-12-31 DIAGNOSIS — M25512 Pain in left shoulder: Secondary | ICD-10-CM | POA: Insufficient documentation

## 2020-12-31 DIAGNOSIS — M545 Low back pain, unspecified: Secondary | ICD-10-CM | POA: Insufficient documentation

## 2020-12-31 DIAGNOSIS — M25551 Pain in right hip: Secondary | ICD-10-CM | POA: Insufficient documentation

## 2020-12-31 DIAGNOSIS — M25552 Pain in left hip: Secondary | ICD-10-CM | POA: Insufficient documentation
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
## 2020-12-31 NOTE — Telephone Encounter (Signed)
12/31/20 FCE results faxed to [employer] [phone]

## 2020-12-31 NOTE — Telephone Encounter (Signed)
Patient underwent functional capacity evaluation today, 2020-12-31.  He gave a sincere effort during the evaluation.  Based on his test, he is given a rating of medium level work for an 8-hour day with a light tolerance for mobility.  Prolonged walking and prolonged time on his feet increases his pain significantly.  He should be allowed to have periodic rest breaks off his feet in order to improve his chances at returning to work successfully.

## 2021-01-06 ENCOUNTER — Encounter (INDEPENDENT_AMBULATORY_CARE_PROVIDER_SITE_OTHER): Payer: Self-pay | Admitting: Primary Care

## 2021-01-06 ENCOUNTER — Ambulatory Visit (INDEPENDENT_AMBULATORY_CARE_PROVIDER_SITE_OTHER): Payer: Self-pay | Admitting: Primary Care

## 2021-01-06 ENCOUNTER — Other Ambulatory Visit: Payer: Self-pay

## 2021-01-06 VITALS — BP 128/88 | HR 109 | Wt 195.6 lb

## 2021-01-06 DIAGNOSIS — M7989 Other specified soft tissue disorders: Secondary | ICD-10-CM

## 2021-01-06 NOTE — Progress Notes (Signed)
Edema of right leg

## 2021-01-06 NOTE — Progress Notes (Signed)
Acute Office Visit  Subjective:    Patient ID: John Byrd, male    DOB: 09-29-1961, 60 y.o.   MRN: [id number]  Chief Complaint  Patient presents with  . Edema    HPI John Byrd is a 60 year old Hispanic male (interputor Darmaris [phone]) in today for right swollen leg for 8 days sore above malleolus DVT vs cellulitis . Question insect bite.   Past Medical History:  Diagnosis Date  . 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 Back pain   . CHF (congestive heart failure) (HCC)   . Closed left acetabular fracture (HCC) 11/30/2019  . Frequent headaches   . Heart murmur   . Hypertension   . Muscle pain     Past Surgical History:  Procedure Laterality Date  . AORTIC VALVE REPLACEMENT N/A 03/11/2018   Procedure: AORTIC VALVE REPLACEMENT (AVR) USING 21 MM MAGNA EASE PERICARDIAL [name], MODEL 3300TFX, SERIAL # [id number];  Surgeon: Loreli Slot, MD;  Location: [facility] OR;  Service: Open Heart Surgery;  Laterality: N/A;  . IR ANGIOGRAM SELECTIVE EACH ADDITIONAL VESSEL  11/29/2019  . IR ANGIOGRAM SELECTIVE EACH ADDITIONAL VESSEL  11/29/2019  . IR ANGIOGRAM SELECTIVE EACH ADDITIONAL VESSEL  11/29/2019  . IR ANGIOGRAM VISCERAL SELECTIVE  11/29/2019  . IR EMBO ART  VEN HEMORR LYMPH EXTRAV  INC GUIDE ROADMAPPING  11/29/2019  . IR US GUIDE VASC ACCESS RIGHT  11/29/2019  . No prior surgery    . VIDEO BRONCHOSCOPY N/A 03/11/2018   Procedure: VIDEO BRONCHOSCOPY;  Surgeon: Loreli Slot, MD;  Location: [facility] OR;  Service: Open Heart Surgery;  Laterality: N/A;    No family history on file.  Social History   Socioeconomic History  . Marital status: Single    Spouse name: Not on file  . Number of children: Not on file  . Years of education: Not on file  . Highest education level: Not on file  Occupational  History  . Not on file  Tobacco Use  . Smoking status: Never Smoker  . Smokeless tobacco: Never Used  Vaping Use  . Vaping Use: Never used  Substance and Sexual Activity  . Alcohol use: Yes  . Drug use: Not Currently  . Sexual activity: Not on file  Other Topics Concern  . Not on file  Social History Narrative   ** Merged History Encounter **       Lives with girlfriend [name] and her son [name] in [place], [place]. 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 Has three adult daughters who live in [place].    Social Determinants of Health   Financial Resource Strain: Not on file  Food Insecurity: Not on file  Transportation Needs: Not on file  Physical Activity: Not on file  Stress: Not on file  Social Connections: Not on file  Intimate Partner Violence: Not on file    Outpatient Medications Prior to Visit  Medication Sig Dispense Refill  . albuterol (VENTOLIN HFA) 108 (90 Base) MCG/ACT inhaler Inhale 2 puffs into the lungs every 6 (six) hours as needed for wheezing or shortness of breath. 18 g 2  . atorvastatin (LIPITOR) 20 MG tablet Take 1 tablet (20 mg total) by mouth daily. 90 tablet 3  . carvedilol (COREG) 6.25 MG tablet Take 1 tablet (6.25 mg total) by mouth 2 (two) times daily.  60 tablet 6  . celecoxib (CELEBREX) 200 MG capsule Take 1 capsule (200 mg total) by mouth 2 (two) times daily as needed. 60 capsule 6  . clindamycin (CLEOCIN T) 1 % lotion Apply topically 2 (two) times daily. 60 mL 0  . methocarbamol (ROBAXIN) 500 MG tablet Take 2 tablets (1,000 mg total) by mouth every 8 (eight) hours as needed for muscle spasms. 60 tablet 1  . pantoprazole (PROTONIX) 40 MG tablet Take 1 tablet (40 mg total) by mouth daily. 30 tablet 0  . tiZANidine (ZANAFLEX) 2 MG tablet TAKE 1-2 TABLETS (2-4 MG TOTAL) BY MOUTH EVERY 6 (SIX) HOURS AS NEEDED FOR MUSCLE SPASMS. 60 tablet 1   No facility-administered medications prior to visit.     No Known Allergies  Review of Systems Pertinent positive and negative noted in HPI     Objective:    Physical Exam Vitals reviewed.  Constitutional:      Appearance: He is obese.  HENT:     Head: Normocephalic.     Right Ear: External ear normal.     Left Ear: External ear normal.  Eyes:     Extraocular Movements: Extraocular movements intact.  Cardiovascular:     Rate and Rhythm: Normal rate and regular rhythm.  Pulmonary:     Effort: Pulmonary effort is normal.     Breath sounds: Normal breath sounds.  Abdominal:     General: Bowel sounds are normal. There is distension.     Palpations: Abdomen is soft.  Musculoskeletal:        General: Swelling and tenderness present.     Cervical back: Normal range of motion.     Right lower leg: Edema present.     Comments: warmth  Neurological:     Mental Status: He is alert.     BP 128/88 (BP Location: Left Arm, Patient Position: Sitting)   Pulse (!) 109   Wt 195 lb 9.6 oz (88.7 kg)   SpO2 95%   BMI 37.57 kg/m  Wt Readings from Last 3 Encounters:  01/06/21 195 lb 9.6 oz (88.7 kg)  10/15/20 193 lb (87.5 kg)  10/08/20 193 lb (87.5 kg)    Health Maintenance Due  Topic Date Due  . COVID-19 Vaccine (1) Never done  . COLONOSCOPY (Pts 45-29yrs Insurance coverage will need to be confirmed)  Never done  . INFLUENZA VACCINE  07/04/2020    There are no preventive care reminders to display for this patient.   No results found for: TSH Lab Results  Component Value Date   WBC 8.4 10/08/2020   HGB 15.9 10/08/2020   HCT 46.1 10/08/2020   MCV 90 10/08/2020   PLT 279 10/08/2020   Lab Results  Component Value Date   NA 135 12/14/2019   K 3.6 12/14/2019   CO2 20 (L) 12/14/2019   GLUCOSE 101 (H) 12/14/2019   BUN 17 12/14/2019   CREATININE 0.79 12/14/2019   BILITOT 0.8 12/11/2019   ALKPHOS 133 (H) 12/11/2019   AST 55 (H) 12/11/2019   ALT 123 (H) 12/11/2019   PROT 6.7 12/11/2019   ALBUMIN 2.8 (L) 12/11/2019    CALCIUM 7.8 (L) 12/14/2019   ANIONGAP 7 12/14/2019   Lab Results  Component Value Date   CHOL 187 10/08/2020   Lab Results  Component Value Date   HDL 41 10/08/2020   Lab Results  Component Value Date   LDLCALC 117 (H) 10/08/2020   Lab Results  Component Value Date   TRIG 163 (  H) 10/08/2020   Lab Results  Component Value Date   CHOLHDL 4.6 10/08/2020   Lab Results  Component Value Date   HGBA1C 5.9 (H) 12/07/2019       Assessment & Plan:  Bowyn was seen today for edema.  Diagnoses and all orders for this visit:  Swollen leg Differential  dx DVT v/s cellulitis   sore above malleolus unknown origin insect bite?  -     US Venous Img Lower Unilateral Right; Future    Grayce Sessions, NP

## 2021-01-07 ENCOUNTER — Ambulatory Visit (HOSPITAL_COMMUNITY)
Admission: RE | Admit: 2021-01-07 | Discharge: 2021-01-07 | Disposition: A | Discharge status: Home / Self Care | Payer: Self-pay | Source: Ambulatory Visit | Attending: Primary Care | Admitting: Primary Care

## 2021-01-07 ENCOUNTER — Telehealth (INDEPENDENT_AMBULATORY_CARE_PROVIDER_SITE_OTHER): Payer: Self-pay | Admitting: Primary Care

## 2021-01-07 DIAGNOSIS — M7989 Other specified soft tissue disorders: Secondary | ICD-10-CM | POA: Insufficient documentation

## 2021-01-07 NOTE — Telephone Encounter (Signed)
Called to inform the doctor that the patient's right le was negative for DVT.  Any questions please call [phone]

## 2021-01-07 NOTE — Progress Notes (Signed)
Lower extremity venous RT study completed.  Preliminary results relayed to Steward Drone for Randa Evens, NP.   See CV Proc for preliminary results report.   Jean Rosenthal, RDMS  On behalf of Clint Guy, RVT

## 2021-01-17 ENCOUNTER — Other Ambulatory Visit: Payer: Self-pay | Admitting: Family Medicine

## 2021-01-17 ENCOUNTER — Ambulatory Visit (INDEPENDENT_AMBULATORY_CARE_PROVIDER_SITE_OTHER): Payer: Self-pay | Admitting: Family Medicine

## 2021-01-17 ENCOUNTER — Other Ambulatory Visit: Payer: Self-pay

## 2021-01-17 ENCOUNTER — Encounter: Payer: Self-pay | Admitting: Family Medicine

## 2021-01-17 DIAGNOSIS — M1711 Unilateral primary osteoarthritis, right knee: Secondary | ICD-10-CM

## 2021-01-17 DIAGNOSIS — M1712 Unilateral primary osteoarthritis, left knee: Secondary | ICD-10-CM

## 2021-01-17 DIAGNOSIS — L03115 Cellulitis of right lower limb: Secondary | ICD-10-CM

## 2021-01-17 MED ORDER — DOXYCYCLINE HYCLATE 100 MG PO CAPS: 100.0000 mg | ORAL_CAPSULE | Freq: Two times a day (BID) | ORAL | 0 refills | Status: DC

## 2021-01-17 MED FILL — DOXYCYCLINE HYCLATE 100 MG: 100 | 10 days supply | Qty: 20 | Fill #0
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
## 2021-01-19 ENCOUNTER — Telehealth (INDEPENDENT_AMBULATORY_CARE_PROVIDER_SITE_OTHER): Payer: Self-pay

## 2021-01-19 NOTE — Telephone Encounter (Signed)
-----   Message from Grayce Sessions, NP sent at 01/10/2021  9:59 PM EST ----- No DVT

## 2021-01-19 NOTE — Telephone Encounter (Signed)
Called patient with assistance of pacific interpreter [phone]. Per DPR left detailed voicemail notifying patient that he does NOT have a DVT(clot). Return call to [facility] at [phone] with any questions or concerns. Maryjean Morn, CMA

## 2021-01-21 ENCOUNTER — Encounter (INDEPENDENT_AMBULATORY_CARE_PROVIDER_SITE_OTHER): Payer: Self-pay | Admitting: Primary Care

## 2021-01-21 ENCOUNTER — Other Ambulatory Visit: Payer: Self-pay

## 2021-01-21 NOTE — Progress Notes (Signed)
Established Patient Office Visit  Subjective:  Patient ID: John Byrd, male    DOB: 04-Dec-1961  Age: 60 y.o. MRN: [id number]  CC:  Chief Complaint  Patient presents with  . cellulitis     HPI John Byrd presents for   Past Medical History:  Diagnosis Date  . 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 Back pain   . CHF (congestive heart failure) (HCC)   . Closed left acetabular fracture (HCC) 11/30/2019  . Frequent headaches   . Heart murmur   . Hypertension   . Muscle pain     Past Surgical History:  Procedure Laterality Date  . AORTIC VALVE REPLACEMENT N/A 03/11/2018   Procedure: AORTIC VALVE REPLACEMENT (AVR) USING 21 MM MAGNA EASE PERICARDIAL [name], MODEL 3300TFX, SERIAL # [id number];  Surgeon: Loreli Slot, MD;  Location: [facility] OR;  Service: Open Heart Surgery;  Laterality: N/A;  . IR ANGIOGRAM SELECTIVE EACH ADDITIONAL VESSEL  11/29/2019  . IR ANGIOGRAM SELECTIVE EACH ADDITIONAL VESSEL  11/29/2019  . IR ANGIOGRAM SELECTIVE EACH ADDITIONAL VESSEL  11/29/2019  . IR ANGIOGRAM VISCERAL SELECTIVE  11/29/2019  . IR EMBO ART  VEN HEMORR LYMPH EXTRAV  INC GUIDE ROADMAPPING  11/29/2019  . IR US GUIDE VASC ACCESS RIGHT  11/29/2019  . No prior surgery    . VIDEO BRONCHOSCOPY N/A 03/11/2018   Procedure: VIDEO BRONCHOSCOPY;  Surgeon: Loreli Slot, MD;  Location: [facility] OR;  Service: Open Heart Surgery;  Laterality: N/A;    No family history on file.  Social History   Socioeconomic History  . Marital status: Single    Spouse name: Not on file  . Number of children: Not on file  . Years of education: Not on file  . Highest education level: Not on file  Occupational History  . Not on file  Tobacco Use  . Smoking status: Never Smoker  . Smokeless tobacco: Never Used  Vaping Use  . Vaping  Use: Never used  Substance and Sexual Activity  . Alcohol use: Yes  . Drug use: Not Currently  . Sexual activity: Not on file  Other Topics Concern  . Not on file  Social History Narrative   ** Merged History Encounter **       Lives with girlfriend John Byrd and her son John Byrd in [place], [place]. 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 Has three adult daughters who live in [place].    Social Determinants of Health   Financial Resource Strain: Not on file  Food Insecurity: Not on file  Transportation Needs: Not on file  Physical Activity: Not on file  Stress: Not on file  Social Connections: Not on file  Intimate Partner Violence: Not on file    Outpatient Medications Prior to Visit  Medication Sig Dispense Refill  . albuterol (VENTOLIN HFA) 108 (90 Base) MCG/ACT inhaler Inhale 2 puffs into the lungs every 6 (six) hours as needed for wheezing or shortness of breath. 18 g 2  . atorvastatin (LIPITOR) 20 MG tablet Take 1 tablet (20 mg total) by mouth daily. 90 tablet 3  . carvedilol (COREG) 6.25 MG tablet Take 1 tablet (6.25 mg total) by mouth 2 (two) times daily. 60 tablet 6  . celecoxib (CELEBREX) 200 MG capsule Take 1 capsule (200 mg total) by mouth 2 (two) times daily as needed. 60 capsule  6  . clindamycin (CLEOCIN T) 1 % lotion Apply topically 2 (two) times daily. 60 mL 0  . doxycycline (VIBRAMYCIN) 100 MG capsule Take 1 capsule (100 mg total) by mouth 2 (two) times daily. 20 capsule 0  . tiZANidine (ZANAFLEX) 2 MG tablet TAKE 1-2 TABLETS (2-4 MG TOTAL) BY MOUTH EVERY 6 (SIX) HOURS AS NEEDED FOR MUSCLE SPASMS. 60 tablet 1  . pantoprazole (PROTONIX) 40 MG tablet Take 1 tablet (40 mg total) by mouth daily. 30 tablet 0  . methocarbamol (ROBAXIN) 500 MG tablet Take 2 tablets (1,000 mg total) by mouth every 8 (eight) hours as needed for muscle spasms. 60 tablet 1   No facility-administered medications prior to visit.    No Known  Allergies  ROS Review of Systems    Objective:    Physical Exam  BP 127/86 (BP Location: Right Arm, Patient Position: Sitting, Cuff Size: Normal)   Pulse 81   Temp (!) 97.3 F (36.3 C) (Temporal)   Ht 5\' 5"  (1.651 m)   Wt 198 lb (89.8 kg)   SpO2 93%   BMI 32.95 kg/m  Wt Readings from Last 3 Encounters:  01/21/21 198 lb (89.8 kg)  01/06/21 195 lb 9.6 oz (88.7 kg)  10/15/20 193 lb (87.5 kg)     Health Maintenance Due  Topic Date Due  . COVID-19 Vaccine (1) Never done  . COLONOSCOPY (Pts 45-72yrs Insurance coverage will need to be confirmed)  Never done    There are no preventive care reminders to display for this patient.  No results found for: TSH Lab Results  Component Value Date   WBC 8.4 10/08/2020   HGB 15.9 10/08/2020   HCT 46.1 10/08/2020   MCV 90 10/08/2020   PLT 279 10/08/2020   Lab Results  Component Value Date   NA 135 12/14/2019   K 3.6 12/14/2019   CO2 20 (L) 12/14/2019   GLUCOSE 101 (H) 12/14/2019   BUN 17 12/14/2019   CREATININE 0.79 12/14/2019   BILITOT 0.8 12/11/2019   ALKPHOS 133 (H) 12/11/2019   AST 55 (H) 12/11/2019   ALT 123 (H) 12/11/2019   PROT 6.7 12/11/2019   ALBUMIN 2.8 (L) 12/11/2019   CALCIUM 7.8 (L) 12/14/2019   ANIONGAP 7 12/14/2019   Lab Results  Component Value Date   CHOL 187 10/08/2020   Lab Results  Component Value Date   HDL 41 10/08/2020   Lab Results  Component Value Date   LDLCALC 117 (H) 10/08/2020   Lab Results  Component Value Date   TRIG 163 (H) 10/08/2020   Lab Results  Component Value Date   CHOLHDL 4.6 10/08/2020   Lab Results  Component Value Date   HGBA1C 5.9 (H) 12/07/2019      Assessment & Plan:   Problem List Items Addressed This Visit   None     No orders of the defined types were placed in this encounter.   Follow-up: No follow-ups on file.    02/04/2020, NP

## 2021-01-25 ENCOUNTER — Ambulatory Visit (INDEPENDENT_AMBULATORY_CARE_PROVIDER_SITE_OTHER): Payer: Self-pay | Admitting: Family Medicine

## 2021-01-25 ENCOUNTER — Other Ambulatory Visit: Payer: Self-pay

## 2021-01-25 DIAGNOSIS — M1712 Unilateral primary osteoarthritis, left knee: Secondary | ICD-10-CM

## 2021-01-25 DIAGNOSIS — M1711 Unilateral primary osteoarthritis, right knee: Secondary | ICD-10-CM
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
## 2021-02-15 ENCOUNTER — Telehealth (INDEPENDENT_AMBULATORY_CARE_PROVIDER_SITE_OTHER): Payer: Self-pay

## 2021-02-15 NOTE — Telephone Encounter (Signed)
Please have patient contact ortho for refill. This was prescribed by their office.

## 2021-02-15 NOTE — Telephone Encounter (Signed)
Patient called to request a medication refill for muscle spasm. Patient states he does not recall the name of the medication.  Patient uses [facility] pharmacy   Please advice [phone]

## 2021-02-16 NOTE — Telephone Encounter (Signed)
I have tired to contact the patient but was unsuccessful. I left a voice mail advising him to contact his ortho provider to request a refill.

## 2021-03-29 ENCOUNTER — Telehealth (INDEPENDENT_AMBULATORY_CARE_PROVIDER_SITE_OTHER): Payer: Self-pay | Admitting: Primary Care

## 2021-03-29 NOTE — Telephone Encounter (Signed)
Patient called in to request refills of ibuprofen and albuterol inhaler. Advised patient I did not see ibuprofen in his med list and that I was unsure if provider would fill inhaler due to it not being filled since last year. Please refill if appropriate.

## 2021-03-29 NOTE — Telephone Encounter (Signed)
Attempted to contact patient at number listed in chart. Received a message that the number is not in service. IF patient calls back please schedule an appointment with provider to discuss need for medications.

## 2021-03-29 NOTE — Telephone Encounter (Signed)
Please schedule patient an appointment to discuss need for both medications.

## 2021-06-23 ENCOUNTER — Ambulatory Visit (INDEPENDENT_AMBULATORY_CARE_PROVIDER_SITE_OTHER): Payer: Self-pay | Admitting: Primary Care

## 2021-06-23 ENCOUNTER — Encounter (INDEPENDENT_AMBULATORY_CARE_PROVIDER_SITE_OTHER): Payer: Self-pay | Admitting: Primary Care

## 2021-06-23 ENCOUNTER — Other Ambulatory Visit: Payer: Self-pay

## 2021-06-23 VITALS — BP 129/85 | HR 83 | Temp 97.3°F | Ht 60.0 in | Wt 197.6 lb

## 2021-06-23 DIAGNOSIS — M545 Low back pain, unspecified: Secondary | ICD-10-CM

## 2021-06-23 DIAGNOSIS — Z6838 Body mass index (BMI) 38.0-38.9, adult: Secondary | ICD-10-CM

## 2021-06-23 DIAGNOSIS — M25562 Pain in left knee: Secondary | ICD-10-CM

## 2021-06-23 DIAGNOSIS — E6609 Other obesity due to excess calories: Secondary | ICD-10-CM

## 2021-06-23 DIAGNOSIS — G8929 Other chronic pain: Secondary | ICD-10-CM

## 2021-06-23 DIAGNOSIS — M25561 Pain in right knee: Secondary | ICD-10-CM

## 2021-06-23 MED ORDER — CELECOXIB 200 MG PO CAPS
200.0000 mg | ORAL_CAPSULE | Freq: Two times a day (BID) | ORAL | 1 refills | Status: DC | PRN
  Filled 2021-06-23: qty 60, 30d supply, fill #0
  Filled 2021-08-12: qty 60, 30d supply, fill #1
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
## 2021-06-23 NOTE — Patient Instructions (Signed)
Recuento de caloras para bajar de peso Calorie Counting for Weight Loss Las caloras son unidades de energa. El cuerpo necesita una cierta cantidad de caloras de los alimentos para que lo ayuden a funcionar durante todo el da. Cuando se comen o beben ms caloras de las que el cuerpo necesita, este acumula las caloras adicionales mayormente como grasa. Cuando se comen o beben menos caloras de las que el cuerpo necesita, este quema grasa para obtener laenerga que necesita. El recuento de caloras es el registro de la cantidad de caloras que se comen y beben cada da. El recuento de caloras puede ser de ayuda si necesita perder peso. Si come menos caloras de las que el cuerpo necesita, debera bajar depeso. Pregntele al mdico cul es un peso sano para usted. Para que el recuento de caloras funcione, usted tendr que ingerir la cantidad de caloras adecuadas cada da, para bajar una cantidad de peso saludable por semana. Un nutricionista puede ayudar a determinar la cantidad de caloras que usted necesita por da y sugerirle formas de alcanzar su objetivo calrico. Una cantidad de peso saludable para bajar cada semana suele ser entre 1 y 2 libras (0.5 a 0.9 kg). Esto habitualmente significa que su ingesta diaria de caloras se debera reducir en unas 500 a 750 caloras. Ingerir de 1200 a 1500 caloras por da puede ayudar a la mayora de las mujeres a bajar de peso. Ingerir de 1500 a 1800 caloras por da puede ayudar a la mayora de los hombres a bajar de peso. Qu debo saber acerca del recuento de caloras? Trabaje con el mdico o el nutricionista para determinar cuntas caloras debe recibir cada da. A fin de alcanzar su objetivo diario de caloras, tendr que: Averiguar cuntas caloras hay en cada alimento que le gustara comer. Intente hacerlo antes de comer. Decidir la cantidad que puede comer del alimento. Llevar un registro de los alimentos. Para esto, anote lo que comi y cuntas  caloras tena. Para perder peso con xito, es importante equilibrar el recuento de calorascon un estilo de vida saludable que incluya actividad fsica de forma regular. Dnde encuentro informacin sobre las caloras?  Es posible encontrar la cantidad de caloras que contiene un alimento en la etiqueta de informacin nutricional. Si un alimento no tiene una etiqueta de informacin nutricional, intente buscar las caloras en Internet o pida ayudaal nutricionista. Recuerde que las caloras se calculan por porcin. Si opta por comer ms de una porcin de un alimento, tendr que multiplicar las caloras de una porcin por la cantidad de porciones que planea comer. Por ejemplo, la etiqueta de un envase de pan puede decir que el tamao de una porcin es 1 rodaja, y que una porcin tiene 90 caloras. Si come 1 rodaja, habr comido 90 caloras. Si come2 rodajas, habr comido 180 caloras. Cmo llevo un registro de comidas? Despus de cada vez que coma, anote lo siguiente en el registro de alimentos lo antes posible: Lo que comi. Asegrese de incluir los aderezos, las salsas y otros extras en los alimentos. La cantidad que comi. Esto se puede medir en tazas, onzas o cantidad de alimentos. Cuntas caloras haba en cada alimento y en cada bebida. La cantidad total de caloras en la comida que tom. Tenga a mano el registro de alimentos, por ejemplo, en un anotador de bolsillo o utilice una aplicacin o sitio web en el telfono mvil. Algunos programas calcularn las caloras por usted y le mostrarn la cantidad de caloras que lequedan para llegar   al objetivo diario. Cules son algunos consejos para controlar las porciones? Sepa cuntas caloras hay en una porcin. Esto lo ayudar a saber cuntas porciones de un alimento determinado puede comer. Use una taza medidora para medir los tamaos de las porciones. Tambin puede intentar pesar las porciones en una balanza de cocina. Con el tiempo, podr hacer un  clculo estimativo de los tamaos de las porciones de algunos alimentos. Dedique tiempo a poner porciones de diferentes alimentos en sus platos, tazones y tazas predilectos, a fin de saber cmo se ve una porcin. Intente no comer directamente de un envase de alimentos, por ejemplo, de una bolsa o una caja. Comer directamente del envase dificulta ver cunto est comiendo y puede conducir a comer en exceso. Ponga la cantidad que le gustara comer en una taza o un plato, a fin de asegurarse de que est comiendo la porcin correcta. Use platos, vasos y tazones ms pequeos para medir porciones ms pequeas y evitar no comer en exceso. Intente no realizar varias tareas al mismo tiempo. Por ejemplo, evite mirar televisin o usar la computadora mientras come. Si es la hora de comer, sintese a la mesa y disfrute de la comida. Esto lo ayudar a reconocer cundo est satisfecho. Tambin le permitir estar ms consciente de qu come y cunto come. Consejos para seguir este plan Al leer las etiquetas de los alimentos Controle el recuento de caloras en comparacin con el tamao de la porcin. El tamao de la porcin puede ser ms pequeo de lo que suele comer. Verifique la fuente de las caloras. Intente elegir alimentos ricos en protenas, fibras y vitaminas, y bajos en grasas saturadas, grasas trans y sodio. Al ir de compras Lea las etiquetas nutricionales cuando compre. Esto lo ayudar a tomar decisiones saludables sobre qu alimentos comprar. Preste atencin a las etiquetas nutricionales de alimentos bajos en grasas o sin grasas. Estos alimentos a veces tienen la misma cantidad de caloras o ms caloras que las versiones ricas en grasas. Con frecuencia, tambin tienen agregados de azcar, almidn o sal, para darles el sabor que fue eliminado con las grasas. Haga una lista de compras con los alimentos que tienen un menor contenido de caloras y resptela. Al cocinar Intente cocinar sus alimentos preferidos de  una manera ms saludable. Por ejemplo, pruebe hornear en vez de frer. Utilice productos lcteos descremados. Planificacin de las comidas Utilice ms frutas y verduras. La mitad de su plato debe ser de frutas y verduras. Incluya protenas magras, como pollo, pavo y pescado. Estilo de vida Cada semana, trate de hacer una de las siguientes cosas: 150 minutos de ejercicio moderado, como caminar. 75 minutos de ejercicio enrgico, como correr. Informacin general Sepa cuntas caloras tienen los alimentos que come con ms frecuencia. Esto le ayudar a contar las caloras ms rpidamente. Encuentre un mtodo para controlar las caloras que funcione para usted. Sea creativo. Pruebe aplicaciones o programas distintos, si llevar un registro de las caloras no funciona para usted. Qu alimentos debo consumir?  Consuma alimentos nutritivos. Es mejor comer un alimento nutritivo, de alto contenido calrico, como un aguacate, que uno con pocos nutrientes, como una bolsa de patatas fritas. Use sus caloras en alimentos y bebidas que lo sacien y no lo dejen con apetito apenas termina de comer. Ejemplos de alimentos que lo sacian son los frutos secos y mantequillas de frutos secos, verduras, protenas magras y alimentos con alto contenido de fibra como los cereales integrales. Los alimentos con alto contenido de fibra son aquellos que   tienen ms de 5 g de fibra por porcin. Preste atencin a las caloras en las bebidas. Las bebidas de bajas caloras incluyen agua y refrescos sin azcar. Es posible que los productos que se enumeran ms arriba no constituyan una lista completa de los alimentos y las bebidas que puede tomar. Consulte a un nutricionista para obtener ms informacin. Qu alimentos debo limitar? Limite el consumo de alimentos o bebidas que no sean buenas fuentes de vitaminas, minerales o protenas, o que tengan alto contenido de grasas no saludables. Estos incluyen: Caramelos. Otros  dulces. Refrescos, bebidas con caf especiales, alcohol y jugo. Es posible que los productos que se enumeran ms arriba no constituyan una lista completa de los alimentos y las bebidas que debe evitar. Consulte a un nutricionista para obtener ms informacin. Cmo puedo hacer el recuento de caloras cuando como afuera? Preste atencin a las porciones. A menudo, las porciones son mucho ms grandes al comer afuera. Pruebe con estos consejos para mantener las porciones ms pequeas: Considere la posibilidad de compartir una comida en lugar de tomarla toda usted solo. Si pide su propia comida, coma solo la mitad. Antes de empezar a comer, pida un recipiente y ponga la mitad de la comida en l. Cuando sea posible, considere la posibilidad de pedir porciones ms pequeas del men en lugar de porciones completas. Preste atencin a la eleccin de alimentos y bebidas. Saber la forma en que se cocinan los alimentos y lo que incluye la comida puede ayudarlo a ingerir menos caloras. Si se detallan las caloras en el men, elija las opciones que contengan la menor cantidad. Elija platos que incluyan verduras, frutas, cereales integrales, productos lcteos con bajo contenido de grasa y protenas magras. Opte por los alimentos hervidos, asados, cocidos a la parrilla o al vapor. Evite los alimentos a los que se les ponga mantequilla, que estn empanados o fritos, o que se sirvan con salsa a base de crema. Generalmente, los alimentos que se etiquetan como "crujientes" estn fritos, a menos que se indique lo contrario. Elija el agua, la leche descremada, el t helado sin azcar u otras bebidas que no contengan azcares agregados. Si desea una bebida alcohlica, escoja una opcin con menos caloras, como una copa de vino o una cerveza ligera. Ordene los aderezos, las salsas y los jarabes aparte. Estos son, con frecuencia, de alto contenido en caloras, por lo que debe limitar la cantidad que ingiere. Si desea una  ensalada, elija una de hortalizas y pida carnes a la parrilla. Evite las guarniciones adicionales como el tocino, el queso o los alimentos fritos. Ordene el aderezo aparte o pida aceite de oliva y vinagre o limn para aderezar. Haga un clculo estimativo de la cantidad de porciones que le sirven. Conocer el tamao de las porciones lo ayudar a estar atento a la cantidad de comida que come en los restaurantes. Dnde buscar ms informacin Centers for Disease Control and Prevention (Centros para el Control y la Prevencin de Enfermedades): www.cdc.gov U.[place]. Department of Agriculture (Departamento de Agricultura de los EE. UU.): myplate.gov Resumen El recuento de caloras es el registro de la cantidad de caloras que se comen y beben cada da. Si come menos caloras de las que el cuerpo necesita, debera bajar de peso. Una cantidad de peso saludable para bajar por semana suele ser entre 1 y 2 libras (0.5 a 0.9 kg). Esto significa, con frecuencia, reducir su ingesta diaria de caloras unas 500 a 750 caloras. Es posible encontrar la cantidad de caloras que   contiene un alimento en la etiqueta de informacin nutricional. Si un alimento no tiene una etiqueta de informacin nutricional, intente buscar las caloras en Internet o pida ayuda al nutricionista. Use platos, vasos y tazones ms pequeos para medir porciones ms pequeas y Automotive engineer no comer en exceso. Use sus caloras en alimentos y bebidas que lo sacien y no lo dejen con apetito poco tiempo despus de haber comido. Esta informacin no tiene Theme park manager el consejo del mdico. Asegresede hacerle al mdico cualquier pregunta que tenga. Document Revised: 03/15/2020 Document Reviewed: 03/15/2020 Elsevier Patient Education  2022 Elsevier Inc. Dolor de espalda crnico Chronic Back Pain Cuando el dolor en la espalda dura ms de 3 meses, se denomina dolor de espalda crnico. El dolor puede empeorar en ciertos momentos (exacerbaciones). Hay cosas que  puede hacer en su casa para Runner, broadcasting/film/video. Siga estas instrucciones en su casa: Est atento a cualquier cambio en los sntomas. Tome estas medidas para aliviarel dolor: Control del dolor y de la rigidez     Si se lo indican, aplique hielo sobre la zona dolorida. El mdico puede indicarle que use hielo durante las 24 a 48 horas luego del comienzo de Investment banker, corporate. Para hacer esto: Ponga el hielo en una bolsa plstica. Coloque una toalla entre la piel y Copy. Coloque el hielo durante 20 minutos, 2 a 3 veces por da. Si se lo indican, aplique calor sobre la zona dolorida. Hgalo con la frecuencia que le haya indicado el mdico. Use la fuente de calor que el mdico le recomiende, como una compresa de calor hmedo o una almohadilla trmica. Coloque una toalla entre la piel y la fuente de Airline pilot. Aplique calor durante 20 a 30 minutos. Retire la fuente de calor si la piel se le pone de color rojo brillante. Esto es especialmente importante si no puede sentir dolor, calor o fro. Puede correr un riesgo mayor de sufrir quemaduras. Sumrjase en un bao clido. Esto puede ayudar a Engineer, materials. Actividad  Evite agacharse y Education officer, environmental otras actividades que Forensic scientist. Cuando est de pie: Mantenga la parte alta de la espalda y el cuello rectos. Mantenga los hombros [employer]. Evite encorvarse. Cuando est sentado: Mantenga la espalda recta. Relaje los hombros. No curve los hombros ni los [employer]. No permanezca sentado o de pie en el mismo lugar durante mucho tiempo. Durante el da, haga pausas breves para descansar. Por lo general, recostarse o Personal assistant de pie es mejor que permanecer sentado. Descansar puede ayudar a Engineer, materials. Cuando est sentado o de pie por [employer], haga un poco de [place] o ejercicios de estiramiento. Esto ayudar a Transport planner rigidez y Chief Technology Officer. Haga ejercicio con regularidad. Pregntele al mdico qu actividades son seguras  para usted. No levante ningn objeto que pese ms de 10 libras (4.5 kg) o el lmite de peso que le indiquen hasta que el mdico le diga que puede [place]. Para evitar lesionarse cuando levanta objetos: Flexione las rodillas. Mantenga el peso cerca del cuerpo. No se tuerza. Duerma sobre un [employer] Intente acostarse de costado, con las rodillas ligeramente flexionadas. Si se recuesta [employer], coloque una almohada debajo de las rodillas.  Medicamentos El tratamiento puede incluir medicamentos para Chief Technology Officer y la hinchazn que se toman por boca o que se ponen sobre la piel, analgsicos recetados o relajantes musculares. Use los medicamentos de venta libre y los recetados solamente como se lo haya indicado el mdico.  Consulte a su mdico si el medicamento que le recetaron: Hace necesario que evite conducir o usar maquinaria. Puede causarle dificultad para defecar (estreimiento). Es posible que deba tomar medidas para prevenir o tratar los problemas para defecar: Product manager suficiente lquido para Radio producer pis (la orina) de color amarillo plido. Usar medicamentos recetados o de Sales promotion account executive. Comer alimentos ricos en fibra. Entre ellos, frijoles, cereales integrales y frutas y verduras frescas. Limitar los alimentos con alto contenido de grasa y International aid/development worker. Estos incluyen alimentos fritos o dulces. Instrucciones generales No consuma ningn producto que contenga nicotina o tabaco, como cigarrillos, cigarrillos electrnicos y tabaco de Theatre manager. Si necesita ayuda para dejar de consumir estos productos, consulte al mdico. Concurra a todas las visitas de seguimiento como se lo haya indicado el mdico. Esto es importante. Comunquese con un mdico si: El dolor no se alivia con reposo ni con medicamentos. Su dolor empeora o tiene un dolor nuevo. Tiene fiebre alta. Pierde peso muy rpidamente. Tiene dificultad para [employer] cotidianas. Solicite ayuda de inmediato si: Siente  debilidad en una o ambas piernas o pies. Pierde la sensibilidad (adormecimiento) en una o ambas piernas o pies. Tiene dificultad para Scientist, physiological materia fecal (las deposiciones) o el pis (la [place]). Tiene un dolor muy intenso en la espalda y: [place] ganas de vomitar (nuseas) o vomita. Siente dolor en el vientre (abdomen). Le falta el aire. Se desmaya. Resumen Cuando el dolor en la espalda dura ms de 3 meses, se denomina dolor de espalda crnico. El dolor puede empeorar en ciertos momentos (exacerbaciones). Use hielo y calor segn le indique el mdico. El mdico puede indicarle que use hielo luego del comienzo de las exacerbaciones. Esta informacin no tiene Theme park manager el consejo del mdico. Asegresede hacerle al mdico cualquier pregunta que tenga. Document Revised: 03/08/2020 Document Reviewed: 03/08/2020 Elsevier Patient Education  2022 [employer].

## 2021-06-24 ENCOUNTER — Other Ambulatory Visit: Payer: Self-pay

## 2021-08-12 ENCOUNTER — Other Ambulatory Visit: Payer: Self-pay

## 2021-09-28 ENCOUNTER — Ambulatory Visit (INDEPENDENT_AMBULATORY_CARE_PROVIDER_SITE_OTHER): Payer: Self-pay | Admitting: Primary Care

## 2021-10-07 ENCOUNTER — Ambulatory Visit (INDEPENDENT_AMBULATORY_CARE_PROVIDER_SITE_OTHER): Payer: Self-pay | Admitting: Primary Care

## 2021-10-07 ENCOUNTER — Encounter (INDEPENDENT_AMBULATORY_CARE_PROVIDER_SITE_OTHER): Payer: Self-pay | Admitting: Primary Care

## 2021-10-07 ENCOUNTER — Other Ambulatory Visit: Payer: Self-pay

## 2021-10-07 VITALS — BP 121/83 | HR 91 | Temp 98.1°F | Wt 188.8 lb

## 2021-10-07 DIAGNOSIS — M25562 Pain in left knee: Secondary | ICD-10-CM

## 2021-10-07 DIAGNOSIS — G8929 Other chronic pain: Secondary | ICD-10-CM

## 2021-10-07 DIAGNOSIS — M25561 Pain in right knee: Secondary | ICD-10-CM

## 2021-10-07 DIAGNOSIS — R7303 Prediabetes: Secondary | ICD-10-CM

## 2021-10-07 DIAGNOSIS — M545 Low back pain, unspecified: Secondary | ICD-10-CM

## 2021-10-07 MED ORDER — CELECOXIB 200 MG PO CAPS
200.0000 mg | ORAL_CAPSULE | Freq: Two times a day (BID) | ORAL | 1 refills | Status: AC | PRN
  Filled 2021-10-07: qty 60, 30d supply, fill #0

## 2021-10-07 NOTE — Patient Instructions (Signed)
Dolor de rodilla crnico en los adultos Chronic Knee Pain, Adult El dolor de rodilla que dura ms de 3 meses se denomina dolor de rodilla crnico. Puede sentir dolor en una o en ambas rodillas. Los sntomas de dolor de rodilla crnico tambin pueden incluir hinchazn y rigidez. La causa ms frecuente es el desgaste de la articulacin de la rodilla (osteoartritis) relacionado con la edad. Muchas afecciones pueden causar dolor crnico en lasrodillas. El tratamiento depende de la causa. Los tratamientos principales son la fisioterapia y la prdida de peso. Tambin puede tratarse con medicamentos, inyecciones, una rodillera o un dispositivo ortopdico, y el uso de muletas. Adems, puede recomendarse reposo, hielo, presin (compresin) y elevacin, lo que tambin se conoce como terapia RHCE. Siga estas instrucciones en su casa: Si tiene una rodillera o un dispositivo ortopdico:  Use la rodillera o el dispositivo ortopdico como se lo haya indicado el mdico. Quteselos solamente como se lo haya indicado el mdico. Afljeselos si los dedos del pie: Hormiguean. Se adormecen. Se tornan fros y de color azul. Mantngalos limpios. Si la rodillera o el dispositivo ortopdico no son impermeables: No deje que se mojen. Pregntele al mdico si puede quitrselos cuando toma un bao de inmersin o una ducha. Si no es as, use una cobertura impermeable.  Control del dolor, la rigidez y la hinchazn     Si se lo indican, aplique calor sobre la rodilla. Hgalo con la frecuencia que le haya indicado el mdico. Use la fuente de calor que el mdico le recomiende, como una compresa de calor hmedo o una almohadilla trmica. Si tiene una rodillera o un dispositivo ortopdico que se puede quitar, proceda como se lo haya indicado el mdico. Coloque una toalla entre la piel y la fuente de calor. Aplique calor durante 20 a 30 minutos. Retire la fuente de calor si la piel se le pone de color rojo brillante. Esto es muy  importante. Si no puede sentir dolor, calor o fro, tiene un mayor riesgo de quemarse. Si se lo indican, aplique hielo sobre la rodilla. Para hacer esto: Si tiene una rodillera o un dispositivo ortopdico que se puede quitar, proceda como se lo haya indicado el mdico. Ponga el hielo en una bolsa plstica. Coloque una toalla entre la piel y la bolsa. Aplique el hielo durante 20 minutos, 2 o 3 veces por da. Retire el hielo si la piel se le pone de color rojo brillante. Esto es muy importante. Si no puede sentir dolor, calor o fro, tiene un mayor riesgo de que se dae la zona. Mueva los dedos del pie con frecuencia. Cuando est sentado o acostado, mantenga la zona lesionada por encima del nivel del corazn. Actividad Evite las actividades en las que ambos pies no estn en contacto con el piso al mismo tiempo (actividades de alto impacto). Algunos ejemplos son correr, saltar la soga y hacer saltos de tijera. Siga el plan de ejercicios que el mdico le prepare. El mdico puede sugerirle que haga lo siguiente: Evitar las actividades que empeoren el dolor de rodilla. Es posible que deba cambiar los ejercicios que hace, los deportes que practica o sus obligaciones laborales. Usar calzado con suelas acolchonadas. Evitar deportes que requieran correr y cambiar de direccin repentinamente. Realizar ejercicios o fisioterapia. Esto se planifica en funcin de sus necesidades y capacidades. Haga ejercicios que mejoren el equilibrio y la fuerza, como el tai chi y el yoga. No apoye el peso del cuerpo sobre la rodilla lesionada hasta que el mdico lo   autorice. Utilice las muletas como se lo haya indicado el mdico. Regrese a sus actividades normales cuando el mdico le diga que es seguro. Indicaciones generales Use los medicamentos de venta libre y los recetados solamente como se lo haya indicado el mdico. Si tiene sobrepeso, trabaje con su mdico y un experto en alimentos (nutricionista) para establecer  metas para bajar de peso. El sobrepeso puede aumentar el dolor de rodilla. No fume ni consuma ningn producto que contenga nicotina o tabaco. Si necesita ayuda para dejar de consumir estos productos, consulte al mdico. Cumpla con todas las visitas de seguimiento. Comunquese con un mdico si: Tiene un dolor de rodilla que no mejora o que empeora. No puede hacer los ejercicios debido al dolor de rodilla. Solicite ayuda de inmediato si: La rodilla se hincha y la hinchazn empeora. No puede mover la rodilla. Siente mucho dolor en la rodilla. Resumen El dolor de rodilla que dura ms de 3 meses se denomina dolor de rodilla crnico. Los tratamientos principales para el dolor de rodilla crnico son la fisioterapia y la prdida de peso. Tambin es posible que deba tomar medicamentos, usar una rodillera o un dispositivo ortopdico, usar muletas y aplicarse hielo o calor en la rodilla. Baje de peso si es necesario. Trabaje con su mdico y un experto en alimentacin (nutricionista) para que le ayuden a establecer metas para bajar de peso. El sobrepeso puede aumentar el dolor de rodilla. Siga el plan de ejercicios que el mdico le prepare. Esta informacin no tiene como fin reemplazar el consejo del mdico. Asegresede hacerle al mdico cualquier pregunta que tenga. Document Revised: 06/22/2020 Document Reviewed: 06/22/2020 Elsevier Patient Education  2022 Elsevier Inc.  

## 2021-10-07 NOTE — Progress Notes (Signed)
[facility]   HPI [name] is a 60 y.o. 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  Alleviating factors rest.  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 18 g 2   albuterol (VENTOLIN HFA) 108 (90 Base) MCG/ACT inhaler INHALE 2 PUFFS INTO THE LUNGS EVERY 6 (SIX) HOURS AS NEEDED FOR WHEEZING OR SHORTNESS OF BREATH. 18 g 2   atorvastatin (LIPITOR) 20 MG tablet TAKE 1 TABLET (20 MG TOTAL) BY MOUTH DAILY. 90 tablet 3   carvedilol (COREG) 6.25 MG tablet Take 1 tablet (6.25 mg total) by mouth 2 (two) times daily. 60 tablet 6   celecoxib (CELEBREX) 200 MG capsule Take 1 capsule (200 mg total) by mouth 2 (two) times daily as needed. 60 capsule 1   pantoprazole (PROTONIX) 40 MG tablet Take 1 tablet (40 mg total) by mouth daily. 30 tablet 0   tiZANidine (ZANAFLEX) 2 MG tablet TAKE 1-2 TABLETS (2-4 MG TOTAL) BY MOUTH EVERY 6 (SIX) HOURS AS NEEDED FOR MUSCLE SPASMS. 60 tablet 1   tiZANidine (ZANAFLEX) 2 MG tablet TAKE 1-2 TABLETS (2-4 MG TOTAL) BY MOUTH EVERY 6 (SIX) HOURS AS NEEDED FOR MUSCLE SPASMS. 60 tablet 1   No current facility-administered medications on file prior to visit.    ROS: all  negative except above.   Physical Exam: There were no vitals filed for this visit. There were no vitals taken for this visit. General Appearance: Well nourished, in no apparent distress. Eyes: PERRLA, EOMs, conjunctiva no swelling or erythema Sinuses: No Frontal/maxillary tenderness ENT/Mouth: Ext aud canals clear, TMs without erythema, bulging. No erythema, swelling, or exudate on post pharynx.  Tonsils not swollen or erythematous. Hearing normal.  Neck: Supple, thyroid normal.  Respiratory: Respiratory effort normal, BS equal bilaterally without rales, rhonchi, wheezing or stridor.  Cardio: RRR with no MRGs. Brisk peripheral pulses without edema.  Abdomen: Soft, + BS.  Non tender, no guarding, rebound, hernias, masses. Lymphatics: Non tender without lymphadenopathy.  Musculoskeletal: Full ROM, 5/5 strength, normal gait.  Crepitus bilateral knees Skin: Warm, dry without rashes, lesions, ecchymosis.  Neuro: Cranial nerves intact. Normal muscle tone, no cerebellar symptoms. Sensation intact.  Psych: Awake and oriented X 3, normal affect, Insight and Judgment appropriate.    Diagnoses and all orders for this visit:  Chronic pain of both knees -     celecoxib (CELEBREX) 200 MG capsule; Take 1 capsule (200 mg total) by mouth 2 (two) times daily as needed. -     Ambulatory referral to Orthopedic Surgery  Chronic bilateral low back pain, unspecified whether sciatica present -     celecoxib (CELEBREX) 200 MG capsule; Take 1  capsule (200 mg total) by mouth 2 (two) times daily as needed. -     Ambulatory referral to Orthopedic Surgery   Prediabetic A1c 5.7 improved a year ago A1c was 5.9 Continue to monitor carbohydrates to include tortillas, rice, potatoes, flour made foods, sweets Grayce Sessions, NP 10:24 AM

## 2021-10-10 ENCOUNTER — Telehealth: Payer: Self-pay

## 2021-10-10 NOTE — Telephone Encounter (Signed)
Called patient no answer. Needs to make appt for bil knees.

## 2021-10-10 NOTE — Telephone Encounter (Signed)
Called patient to schedule appt. He states he is at work and would like for [place] to call him at 4:30.

## 2021-10-11 ENCOUNTER — Other Ambulatory Visit: Payer: Self-pay

## 2021-10-11 NOTE — Telephone Encounter (Signed)
Called multiple times no answer 

## 2022-09-18 ENCOUNTER — Ambulatory Visit (INDEPENDENT_AMBULATORY_CARE_PROVIDER_SITE_OTHER): Payer: Self-pay | Admitting: Primary Care

## 2022-09-18 VITALS — BP 125/81 | HR 88 | Temp 97.7°F | Ht 60.0 in | Wt 176.6 lb

## 2022-09-18 DIAGNOSIS — Z23 Encounter for immunization: Secondary | ICD-10-CM

## 2022-09-18 DIAGNOSIS — G8929 Other chronic pain: Secondary | ICD-10-CM

## 2022-09-18 DIAGNOSIS — M25562 Pain in left knee: Secondary | ICD-10-CM

## 2022-09-18 DIAGNOSIS — M25561 Pain in right knee: Secondary | ICD-10-CM

## 2022-09-18 DIAGNOSIS — Z6838 Body mass index (BMI) 38.0-38.9, adult: Secondary | ICD-10-CM

## 2022-09-18 DIAGNOSIS — E6609 Other obesity due to excess calories: Secondary | ICD-10-CM

## 2022-09-18 NOTE — Patient Instructions (Signed)
Dolor de rodilla crnico en los adultos Chronic Knee Pain, Adult El dolor de rodilla que dura ms de 3 meses se denomina dolor de rodilla crnico. Puede sentir dolor en una o en ambas rodillas. Los sntomas de dolor de rodilla crnico tambin pueden incluir hinchazn y rigidez. La causa ms frecuente es el desgaste de la articulacin de la rodilla (osteoartritis) relacionado con la edad. Muchas afecciones pueden causar dolor crnico en las rodillas. El tratamiento depende de la causa. Los tratamientos principales son la fisioterapia y la prdida de peso. Tambin puede tratarse con medicamentos, inyecciones, una rodillera o un dispositivo ortopdico, y el uso de muletas. Adems, puede recomendarse reposo, hielo, presin (compresin) y elevacin, lo que tambin se conoce como terapia RHCE. Siga estas instrucciones en su casa: Si tiene una rodillera o un dispositivo ortopdico:  Use la rodillera o el dispositivo ortopdico como se lo haya indicado el mdico. Quteselos solamente como se lo haya indicado el mdico. Afljeselos si los dedos del pie: [place]orm[place]. Se adormecen. Se tornan fros y de color azul. Mantngalos limpios. Si la rodillera o el dispositivo ortopdico no son impermeables: No deje que se mojen. Pregntele al mdico si puede quitrselos cuando toma un bao de inmersin o una ducha. Si no es as, use una cobertura impermeable. Control del dolor, la rigidez y la hinchazn     Si se lo indican, aplique calor sobre la rodilla. Hgalo con la frecuencia que le haya indicado el mdico. Use la fuente de calor que el mdico le recomiende, como una compresa de calor hmedo o una almohadilla trmica. Si tiene una rodillera o un dispositivo ortopdico que se puede quitar, proceda como se lo haya indicado el mdico. Coloque una toalla entre la piel y la fuente de calor. Aplique calor durante 20 a 30 minutos. Retire la fuente de calor si la piel se le pone de color rojo brillante. Esto es muy  importante. Si no puede sentir dolor, calor o fro, tiene un mayor riesgo de quemarse. Si se lo indican, aplique hielo sobre la rodilla. Para hacer esto: Si tiene una rodillera o un dispositivo ortopdico que se puede quitar, proceda como se lo haya indicado el mdico. Ponga el hielo en una bolsa plstica. Coloque una toalla entre la piel y la bolsa. Aplique el hielo durante 20 minutos, 2 o 3 veces por da. Retire el hielo si la piel se le pone de color rojo brillante. Esto es muy importante. Si no puede sentir dolor, calor o fro, tiene un mayor riesgo de que se dae la zona. Mueva los dedos del pie con frecuencia. Cuando est sentado o acostado, mantenga la zona lesionada por encima del nivel del corazn. Actividad Evite las actividades en las que ambos pies no estn en contacto con el piso al mismo tiempo (actividades de alto impacto). Algunos ejemplos son correr, saltar la soga y hacer saltos de tijera. Siga el plan de ejercicios que el mdico le prepare. El mdico puede sugerirle que haga lo siguiente: Evitar las actividades que empeoren el dolor de rodilla. Es posible que deba cambiar los ejercicios que hace, los deportes que practica o sus obligaciones laborales. Usar calzado con suelas acolchonadas. Evitar deportes que requieran correr y cambiar de direccin repentinamente. Realizar ejercicios o fisioterapia. Esto se planifica en funcin de sus necesidades y capacidades. Haga ejercicios que mejoren el equilibrio y la fuerza, como el tai chi y el yoga. No apoye el peso del cuerpo sobre la rodilla lesionada hasta que el mdico lo   autorice. Utilice las [employer] se lo haya indicado el mdico. Regrese a sus actividades normales cuando el mdico le diga que es seguro. Indicaciones generales Use los medicamentos de venta libre y los recetados solamente como se lo haya indicado el mdico. Si tiene sobrepeso, trabaje con su mdico y un experto en alimentos (nutricionista) para establecer  metas para bajar de peso. El sobrepeso puede aumentar el dolor de rodilla. No fume ni consuma ningn producto que contenga nicotina o tabaco. Si necesita ayuda para dejar de consumir estos productos, consulte al mdico. Cumpla con todas las visitas de seguimiento. Comunquese con un mdico si: Tiene un dolor de rodilla que no mejora o que [place]. No puede hacer los ejercicios debido al dolor de rodilla. Solicite ayuda de inmediato si: La rodilla se hincha y la hinchazn empeora. No puede mover la rodilla. Siente mucho dolor en la rodilla. Resumen El dolor de rodilla que dura ms de 3 meses se denomina dolor de rodilla crnico. Los tratamientos principales para el dolor de rodilla crnico son la fisioterapia y la prdida de [place]. Tambin es posible que deba tomar medicamentos, usar una rodillera o un dispositivo ortopdico, usar muletas y aplicarse hielo o calor en la rodilla. Baje de peso si es necesario. Trabaje con su mdico y un experto en alimentacin (nutricionista) para que le ayuden a Conservation officer, nature para Sports coach de [place]. El sobrepeso puede aumentar el dolor de rodilla. Siga el plan de ejercicios que Warden/ranger. Esta informacin no tiene Marine scientist el consejo del mdico. Asegrese de hacerle al mdico cualquier pregunta que tenga. Document Revised: 06/22/2020 Document Reviewed: 06/22/2020 Elsevier Patient Education  [place].

## 2022-09-18 NOTE — Progress Notes (Unsigned)
John Byrd, is a 61 y.o. male  RXV:[id number]  [id number]  DOB - 11/02/1961  Chief Complaint  Patient presents with   Knee Pain     John Byrd [id number]  Subjective:   John Byrd is a 61 y.o. male here today bilateral knee pain. 9/10.Aggreating factor            Pain medication OTC   Patient has No headache, No chest pain, No abdominal pain - No Nausea, No new weakness tingling or numbness, No Cough - shortness of breath  No problems updated.  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 18 g 2   carvedilol (COREG) 6.25 MG tablet Take 1 tablet (6.25 mg total) by mouth 2 (two) times daily. 60 tablet 6   celecoxib (CELEBREX) 200 MG capsule Take 1 capsule (200 mg total) by mouth 2 (two) times daily as needed. 60 capsule 1   pantoprazole (PROTONIX) 40 MG tablet Take 1 tablet (40 mg total) by mouth daily. 30 tablet 0   tiZANidine (ZANAFLEX) 2 MG tablet TAKE 1-2 TABLETS (2-4 MG TOTAL) BY MOUTH EVERY 6 (SIX) HOURS AS NEEDED FOR MUSCLE SPASMS. 60 tablet 1   albuterol (VENTOLIN HFA) 108 (90 Base) MCG/ACT inhaler INHALE 2 PUFFS INTO THE LUNGS EVERY 6 (SIX) HOURS AS NEEDED FOR WHEEZING OR SHORTNESS OF BREATH. 18 g 2   atorvastatin (LIPITOR) 20 MG tablet TAKE 1 TABLET (20 MG TOTAL) BY MOUTH DAILY. 90 tablet 3   No current facility-administered  medications on file prior to visit.    Objective:   Vitals:   09/18/22 1558  BP: 125/81  Pulse: 88  Temp: 97.7 F (36.5 C)  TempSrc: Oral  SpO2: 94%  Weight: 176 lb 9.6 oz (80.1 kg)  Height: 5' (1.524 m)    Exam General appearance : Awake, alert, not in any distress. Speech Clear. Not toxic looking HEENT: Atraumatic and Normocephalic, pupils equally reactive to light and accomodation Neck: Supple, no JVD. No cervical lymphadenopathy.  Chest: Good air entry bilaterally, no added sounds  CVS: S1 S2 regular, no murmurs.  Abdomen: Bowel sounds present, Non tender and not distended with no gaurding, rigidity or rebound. Extremities: B/L Lower Ext shows no edema, both legs are warm to touch Neurology: Awake alert, and oriented X 3, CN II-XII intact, Non focal Skin: No Rash  Data Review Lab Results  Component Value Date   HGBA1C 5.9 (H) 12/07/2019   HGBA1C 5.8 (H) 03/09/2018    Assessment & Plan   There are no diagnoses linked to this encounter.   Patient have been counseled extensively about nutrition and exercise. Other issues discussed during this visit include: low cholesterol diet, weight control and daily exercise, foot care, annual eye examinations at Ophthalmology, importance of adherence with medications and regular follow-up. We also discussed long term complications of  uncontrolled diabetes and hypertension.   No follow-ups on file.  The patient was given clear instructions to go to ER or return to medical center if symptoms don't improve, worsen or new problems develop. The patient verbalized understanding. The patient was told to call to get lab results if they haven't heard anything in the next week.   This note has been created with Education officer, environmental. Any transcriptional errors are unintentional.   Grayce Sessions, NP 09/18/2022, 4:16 PM

## 2022-09-26 ENCOUNTER — Ambulatory Visit (INDEPENDENT_AMBULATORY_CARE_PROVIDER_SITE_OTHER): Payer: Self-pay | Admitting: Sports Medicine

## 2022-09-26 ENCOUNTER — Encounter: Payer: Self-pay | Admitting: Sports Medicine

## 2022-09-26 VITALS — Ht 60.0 in | Wt 157.0 lb

## 2022-09-26 DIAGNOSIS — G8929 Other chronic pain: Secondary | ICD-10-CM

## 2022-09-26 DIAGNOSIS — M25562 Pain in left knee: Secondary | ICD-10-CM

## 2022-09-26 DIAGNOSIS — M25561 Pain in right knee: Secondary | ICD-10-CM

## 2022-09-26 DIAGNOSIS — M1712 Unilateral primary osteoarthritis, left knee: Secondary | ICD-10-CM

## 2022-09-26 DIAGNOSIS — M1711 Unilateral primary osteoarthritis, right knee: Secondary | ICD-10-CM

## 2022-09-26 MED ORDER — METHYLPREDNISOLONE ACETATE 40 MG/ML IJ SUSP
80.0000 mg | INTRAMUSCULAR | Status: AC | PRN
  Administered 2022-09-26: 80 mg via INTRA_ARTICULAR

## 2022-09-26 MED ORDER — LIDOCAINE HCL 1 % IJ SOLN
2.0000 mL | INTRAMUSCULAR | Status: AC | PRN
  Administered 2022-09-26: 2 mL

## 2022-09-26 MED ORDER — BUPIVACAINE HCL 0.25 % IJ SOLN
2.0000 mL | INTRAMUSCULAR | Status: AC | PRN
  Administered 2022-09-26: 2 mL via INTRA_ARTICULAR

## 2022-09-26 NOTE — Progress Notes (Signed)
Bilateral knee pain; left worse than the right Ongoing pain for about 2 years  Patient has had PT, injections into both knees; not much relief Wearing a knee brace on the left side  Denies OTC medication for the pain
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
## 2022-10-05 ENCOUNTER — Other Ambulatory Visit: Payer: Self-pay

## 2022-10-24 ENCOUNTER — Ambulatory Visit: Payer: Self-pay | Admitting: Sports Medicine

## 2022-12-20 ENCOUNTER — Ambulatory Visit (INDEPENDENT_AMBULATORY_CARE_PROVIDER_SITE_OTHER): Payer: Self-pay | Admitting: Primary Care
